# Patient Record
Sex: Male | Born: 1960 | Race: Black or African American | Hispanic: No | Marital: Single | Smoking: Current every day smoker
Health system: Southern US, Community
[De-identification: ages and names within clinical notes are randomized; demographics above are authoritative.]

## PROBLEM LIST (undated history)

## (undated) DIAGNOSIS — R45850 Homicidal ideations: Secondary | ICD-10-CM

## (undated) DIAGNOSIS — F329 Major depressive disorder, single episode, unspecified: Secondary | ICD-10-CM

## (undated) DIAGNOSIS — F1910 Other psychoactive substance abuse, uncomplicated: Secondary | ICD-10-CM

## (undated) DIAGNOSIS — M1710 Unilateral primary osteoarthritis, unspecified knee: Secondary | ICD-10-CM

## (undated) DIAGNOSIS — Z765 Malingerer [conscious simulation]: Secondary | ICD-10-CM

## (undated) DIAGNOSIS — F319 Bipolar disorder, unspecified: Secondary | ICD-10-CM

## (undated) DIAGNOSIS — F259 Schizoaffective disorder, unspecified: Secondary | ICD-10-CM

## (undated) DIAGNOSIS — T50901A Poisoning by unspecified drugs, medicaments and biological substances, accidental (unintentional), initial encounter: Secondary | ICD-10-CM

## (undated) DIAGNOSIS — I639 Cerebral infarction, unspecified: Secondary | ICD-10-CM

## (undated) DIAGNOSIS — F209 Schizophrenia, unspecified: Secondary | ICD-10-CM

## (undated) DIAGNOSIS — F1020 Alcohol dependence, uncomplicated: Secondary | ICD-10-CM

## (undated) DIAGNOSIS — F14951 Cocaine use, unspecified with cocaine-induced psychotic disorder with hallucinations: Secondary | ICD-10-CM

## (undated) DIAGNOSIS — I10 Essential (primary) hypertension: Secondary | ICD-10-CM

## (undated) DIAGNOSIS — M1990 Unspecified osteoarthritis, unspecified site: Secondary | ICD-10-CM

## (undated) DIAGNOSIS — F32A Depression, unspecified: Secondary | ICD-10-CM

## (undated) HISTORY — PX: HERNIA REPAIR: SHX51

## (undated) HISTORY — PX: NO PAST SURGERIES: SHX2092

---

## 2003-04-19 ENCOUNTER — Inpatient Hospital Stay (HOSPITAL_COMMUNITY): Admission: EM | Admit: 2003-04-19 | Discharge: 2003-04-19 | Payer: Self-pay | Admitting: Emergency Medicine

## 2003-04-19 ENCOUNTER — Encounter: Payer: Self-pay | Admitting: Emergency Medicine

## 2005-03-15 ENCOUNTER — Emergency Department (HOSPITAL_COMMUNITY): Admission: EM | Admit: 2005-03-15 | Discharge: 2005-03-15 | Payer: Self-pay | Admitting: Emergency Medicine

## 2005-03-16 ENCOUNTER — Emergency Department (HOSPITAL_COMMUNITY): Admission: EM | Admit: 2005-03-16 | Discharge: 2005-03-16 | Payer: Self-pay | Admitting: Emergency Medicine

## 2005-04-05 ENCOUNTER — Emergency Department: Payer: Self-pay | Admitting: Unknown Physician Specialty

## 2005-04-05 ENCOUNTER — Other Ambulatory Visit: Payer: Self-pay

## 2005-04-21 ENCOUNTER — Emergency Department (HOSPITAL_COMMUNITY): Admission: EM | Admit: 2005-04-21 | Discharge: 2005-04-21 | Payer: Self-pay | Admitting: Emergency Medicine

## 2006-03-31 ENCOUNTER — Emergency Department (HOSPITAL_COMMUNITY): Admission: EM | Admit: 2006-03-31 | Discharge: 2006-03-31 | Payer: Self-pay | Admitting: Vascular Surgery

## 2006-05-03 ENCOUNTER — Emergency Department: Payer: Self-pay | Admitting: General Practice

## 2006-05-10 ENCOUNTER — Emergency Department: Payer: Self-pay | Admitting: General Practice

## 2006-05-10 ENCOUNTER — Emergency Department: Payer: Self-pay | Admitting: Emergency Medicine

## 2006-05-10 ENCOUNTER — Other Ambulatory Visit: Payer: Self-pay

## 2006-07-28 ENCOUNTER — Emergency Department (HOSPITAL_COMMUNITY): Admission: EM | Admit: 2006-07-28 | Discharge: 2006-07-28 | Payer: Self-pay | Admitting: Emergency Medicine

## 2011-02-22 ENCOUNTER — Inpatient Hospital Stay (HOSPITAL_COMMUNITY)
Admission: EM | Admit: 2011-02-22 | Discharge: 2011-02-27 | DRG: 894 | Discharge status: Against Medical Advice | Payer: Self-pay | Attending: Internal Medicine | Admitting: Internal Medicine

## 2011-02-22 DIAGNOSIS — F10231 Alcohol dependence with withdrawal delirium: Principal | ICD-10-CM | POA: Diagnosis present

## 2011-02-22 DIAGNOSIS — R4182 Altered mental status, unspecified: Secondary | ICD-10-CM | POA: Diagnosis present

## 2011-02-22 DIAGNOSIS — F17200 Nicotine dependence, unspecified, uncomplicated: Secondary | ICD-10-CM | POA: Diagnosis present

## 2011-02-22 DIAGNOSIS — F1410 Cocaine abuse, uncomplicated: Secondary | ICD-10-CM | POA: Diagnosis present

## 2011-02-22 DIAGNOSIS — F10931 Alcohol use, unspecified with withdrawal delirium: Principal | ICD-10-CM | POA: Diagnosis present

## 2011-02-22 DIAGNOSIS — Z5900 Homelessness unspecified: Secondary | ICD-10-CM

## 2011-02-22 DIAGNOSIS — F29 Unspecified psychosis not due to a substance or known physiological condition: Secondary | ICD-10-CM | POA: Diagnosis present

## 2011-02-22 DIAGNOSIS — F10229 Alcohol dependence with intoxication, unspecified: Secondary | ICD-10-CM | POA: Diagnosis present

## 2011-02-22 LAB — COMPREHENSIVE METABOLIC PANEL WITH GFR
AST: 38 U/L — ABNORMAL HIGH (ref 0–37)
CO2: 22 meq/L (ref 19–32)
Calcium: 9 mg/dL (ref 8.4–10.5)
Creatinine, Ser: 1.1 mg/dL (ref 0.4–1.5)
GFR calc Af Amer: >60 mL/min (ref >60)
GFR calc non Af Amer: >60 mL/min (ref >60)
Glucose, Bld: 142 mg/dL — ABNORMAL HIGH (ref 70–99)

## 2011-02-22 LAB — URINALYSIS, ROUTINE W REFLEX MICROSCOPIC
Bilirubin Urine: NEGATIVE
Glucose, UA: NEGATIVE mg/dL
Hgb urine dipstick: NEGATIVE
Ketones, ur: NEGATIVE mg/dL
Nitrite: NEGATIVE
Protein, ur: NEGATIVE mg/dL
Specific Gravity, Urine: 1.013 (ref 1.005–1.030)
Urobilinogen, UA: 0.2 mg/dL (ref 0.0–1.0)
pH: 5.5 (ref 5.0–8.0)

## 2011-02-22 LAB — CBC
HCT: 43.9 % (ref 39.0–52.0)
Hemoglobin: 14.7 g/dL (ref 13.0–17.0)
MCH: 27.4 pg (ref 26.0–34.0)
MCHC: 33.5 g/dL (ref 30.0–36.0)
MCV: 81.9 fL (ref 78.0–100.0)
Platelets: 261 K/uL (ref 150–400)
RBC: 5.36 MIL/uL (ref 4.22–5.81)
RDW: 13.4 % (ref 11.5–15.5)
WBC: 6.5 10*3/uL (ref 4.0–10.5)

## 2011-02-22 LAB — COMPREHENSIVE METABOLIC PANEL
ALT: 42 U/L (ref 0–53)
Albumin: 4.3 g/dL (ref 3.5–5.2)
Alkaline Phosphatase: 89 U/L (ref 39–117)
BUN: 11 mg/dL (ref 6–23)
Chloride: 110 mEq/L (ref 96–112)
Potassium: 3.7 mEq/L (ref 3.5–5.1)
Sodium: 143 mEq/L (ref 135–145)
Total Bilirubin: 0.3 mg/dL (ref 0.3–1.2)
Total Protein: 7.6 g/dL (ref 6.0–8.3)

## 2011-02-22 LAB — RAPID URINE DRUG SCREEN, HOSP PERFORMED
Amphetamines: NOT DETECTED
Barbiturates: NOT DETECTED
Benzodiazepines: NOT DETECTED
Cocaine: POSITIVE — AB
Opiates: NOT DETECTED
Tetrahydrocannabinol: NOT DETECTED

## 2011-02-22 LAB — DIFFERENTIAL
Basophils Absolute: 0 K/uL (ref 0.0–0.1)
Basophils Relative: 0 % (ref 0–1)
Eosinophils Absolute: 0.3 K/uL (ref 0.0–0.7)
Eosinophils Relative: 4 % (ref 0–5)
Lymphocytes Relative: 43 % (ref 12–46)
Lymphs Abs: 2.8 10*3/uL (ref 0.7–4.0)
Monocytes Absolute: 0.7 K/uL (ref 0.1–1.0)
Monocytes Relative: 11 % (ref 3–12)
Neutro Abs: 2.7 10*3/uL (ref 1.7–7.7)
Neutrophils Relative %: 41 % — ABNORMAL LOW (ref 43–77)

## 2011-02-22 LAB — ETHANOL: Alcohol, Ethyl (B): 335 mg/dL — ABNORMAL HIGH (ref 0–10)

## 2011-02-23 LAB — ETHANOL: Alcohol, Ethyl (B): 173 mg/dL — ABNORMAL HIGH (ref 0–10)

## 2011-02-24 LAB — BASIC METABOLIC PANEL
BUN: 16 mg/dL (ref 6–23)
CO2: 25 mEq/L (ref 19–32)
Calcium: 8.9 mg/dL (ref 8.4–10.5)
Chloride: 108 mEq/L (ref 96–112)
Creatinine, Ser: 0.98 mg/dL (ref 0.4–1.5)
GFR calc Af Amer: >60 mL/min (ref >60)
Glucose, Bld: 94 mg/dL (ref 70–99)

## 2011-02-24 LAB — BASIC METABOLIC PANEL WITH GFR
GFR calc non Af Amer: >60 mL/min (ref >60)
Potassium: 4 meq/L (ref 3.5–5.1)
Sodium: 141 meq/L (ref 135–145)

## 2011-02-26 DIAGNOSIS — F4310 Post-traumatic stress disorder, unspecified: Secondary | ICD-10-CM

## 2011-02-27 ENCOUNTER — Emergency Department (HOSPITAL_COMMUNITY)
Admission: EM | Admit: 2011-02-27 | Discharge: 2011-02-28 | Disposition: A | Discharge status: Home / Self Care | Payer: Self-pay | Attending: Emergency Medicine | Admitting: Emergency Medicine

## 2011-02-27 ENCOUNTER — Emergency Department (HOSPITAL_COMMUNITY): Payer: Self-pay

## 2011-02-27 DIAGNOSIS — R443 Hallucinations, unspecified: Secondary | ICD-10-CM | POA: Insufficient documentation

## 2011-02-27 DIAGNOSIS — R45851 Suicidal ideations: Secondary | ICD-10-CM | POA: Insufficient documentation

## 2011-02-27 DIAGNOSIS — F1010 Alcohol abuse, uncomplicated: Secondary | ICD-10-CM | POA: Insufficient documentation

## 2011-02-27 DIAGNOSIS — F3289 Other specified depressive episodes: Secondary | ICD-10-CM | POA: Insufficient documentation

## 2011-02-27 DIAGNOSIS — F329 Major depressive disorder, single episode, unspecified: Secondary | ICD-10-CM | POA: Insufficient documentation

## 2011-02-27 LAB — DIFFERENTIAL
Basophils Absolute: 0 10*3/uL (ref 0.0–0.1)
Basophils Relative: 1 % (ref 0–1)
Eosinophils Absolute: 0.3 10*3/uL (ref 0.0–0.7)
Eosinophils Relative: 4 % (ref 0–5)
Monocytes Absolute: 0.4 10*3/uL (ref 0.1–1.0)
Monocytes Relative: 7 % (ref 3–12)
Neutro Abs: 3.7 10*3/uL (ref 1.7–7.7)

## 2011-02-27 LAB — ACETAMINOPHEN LEVEL: Acetaminophen (Tylenol), Serum: <10 ug/mL — ABNORMAL LOW (ref 10–30)

## 2011-02-27 LAB — CBC
Hemoglobin: 13.4 g/dL (ref 13.0–17.0)
MCH: 27.3 pg (ref 26.0–34.0)
MCHC: 33.3 g/dL (ref 30.0–36.0)
Platelets: 220 10*3/uL (ref 150–400)
RDW: 13.1 % (ref 11.5–15.5)

## 2011-02-27 LAB — COMPREHENSIVE METABOLIC PANEL
AST: 89 U/L — ABNORMAL HIGH (ref 0–37)
Albumin: 4 g/dL (ref 3.5–5.2)
BUN: 7 mg/dL (ref 6–23)
Calcium: 9.2 mg/dL (ref 8.4–10.5)
Chloride: 105 mEq/L (ref 96–112)
Creatinine, Ser: 0.77 mg/dL (ref 0.4–1.5)
GFR calc Af Amer: >60 mL/min (ref >60)
GFR calc non Af Amer: >60 mL/min (ref >60)
Total Bilirubin: 0.4 mg/dL (ref 0.3–1.2)

## 2011-02-27 LAB — SALICYLATE LEVEL: Salicylate Lvl: <4 mg/dL (ref 2.8–20.0)

## 2011-02-27 LAB — RAPID URINE DRUG SCREEN, HOSP PERFORMED: Barbiturates: NOT DETECTED

## 2011-02-27 LAB — URINALYSIS, ROUTINE W REFLEX MICROSCOPIC
Ketones, ur: NEGATIVE mg/dL
Nitrite: NEGATIVE
Protein, ur: NEGATIVE mg/dL
pH: 6 (ref 5.0–8.0)

## 2011-02-27 NOTE — Consult Note (Signed)
NAME:  [name] [name], [name] [name]                ACCOUNT NO.:  [id number]  MEDICAL RECORD NO.:  [id number]           PATIENT TYPE:  I  LOCATION:  1508                         FACILITY:  [facility]  PHYSICIAN:  Eulogio Ditch, MD DATE OF BIRTH:  12-07-1960  DATE OF CONSULTATION:  02/26/2011 DATE OF DISCHARGE:                                CONSULTATION   REASON FOR CONSULTATION:  Hallucinations/alcohol abuse.  HISTORY OF PRESENT ILLNESS:  This is a 50 year old African American male who was seen in the medical floor with clinical social worker.  I also reviewed the patient's records.  The patient has a [place] history of alcohol and cocaine abuse.  The patient was recently in [place] [facility].  He stayed there for 28 days.  After discharge after 1 week, he started drinking alcohol.  The patient told me that he lives in the street and it is difficult to stay on the street, that is why he started drinking and using cocaine.  The patient wants to go to rehab place in [place].  The patient is very logical and goal directed, not delusional.  He reported hearing voices on and off often, "common dye."  The patient denies any suicidal or homicidal ideations.  The patient is just looking for placement.  He wants to go to the rehab.  The patient is homeless.  No family support.  MEDICAL HISTORY:  History of questionable arthritis.  ALLERGIES:  No known drug allergies.  CURRENT PSYCHIATRIC MEDICATIONS: 1. Zoloft 50 mg p.o. daily. 2. Trazodone 100 mg p.o. daily. 3. Haldol 5 mg p.o. q.h.s. 4. Neurontin 600 mg t.i.d. 5. Cogentin 0.5 mg p.o. b.i.d..  MENTAL STATUS EXAMINATION:  The patient is calm and cooperative during interview.  Fair eye contact, pleasant on approach.  No abnormal movements noticed.  No agitation noted during the interview.  Mood euthymic.  Affect and mood, congruent.  Thought process, logical and goal directed.  Thought content, not suicidal or  homicidal, no delusional thought perception reported on and off hearing voices, but the patient is not internally preoccupied, able to make logical conversation.  Cognition, alert, awake, and oriented x3.  Memory, immediate, recent and remote, fair.  Attention concentration, fair. Abstraction ability, good.  Insight and judgment fair.  DIAGNOSES:  AXIS I:  Polysubstance abuse, psychosis, not otherwise specified. AXIS II:  Deferred. AXIS III:  See medical notes. AXIS IV:  Chronic alcohol abuse, homeless, poor family support. AXIS V:  40 to 50.  RECOMMENDATIONS: 1. I discussed with the patient regarding the abuse of the drugs and     compliance with his medications. 2. The patient wants to go to the inpatient rehab.  He do not want to     live on the street. 3. He wants to go to the rehab in [place].  The case manager is     going to help the patient to get him to the rehab. 4. The patient at this time, does not meet any criteria to be admitted     to Inpatient Psychiatry. 5. I will follow up on this patient as  needed.  Thank you for involving me in taking care of this patient.     Eulogio Ditch, MD     SA/MEDQ  D:  02/26/2011  T:  02/26/2011  Job:  [id number]  Electronically Signed by Eulogio Ditch  on 02/27/2011 06:50:48 PM

## 2011-02-28 DIAGNOSIS — F1920 Other psychoactive substance dependence, uncomplicated: Secondary | ICD-10-CM

## 2011-03-01 ENCOUNTER — Emergency Department (HOSPITAL_COMMUNITY)
Admission: EM | Admit: 2011-03-01 | Discharge: 2011-03-02 | Disposition: A | Discharge status: Home / Self Care | Payer: Self-pay | Attending: Emergency Medicine | Admitting: Emergency Medicine

## 2011-03-01 ENCOUNTER — Emergency Department (HOSPITAL_COMMUNITY): Payer: Self-pay

## 2011-03-01 ENCOUNTER — Emergency Department (HOSPITAL_COMMUNITY)
Admission: EM | Admit: 2011-03-01 | Discharge: 2011-03-01 | Discharge status: Against Medical Advice | Payer: Self-pay | Attending: Emergency Medicine | Admitting: Emergency Medicine

## 2011-03-01 DIAGNOSIS — R569 Unspecified convulsions: Secondary | ICD-10-CM | POA: Insufficient documentation

## 2011-03-01 DIAGNOSIS — F1010 Alcohol abuse, uncomplicated: Secondary | ICD-10-CM | POA: Insufficient documentation

## 2011-03-01 DIAGNOSIS — F319 Bipolar disorder, unspecified: Secondary | ICD-10-CM | POA: Insufficient documentation

## 2011-03-01 DIAGNOSIS — F17200 Nicotine dependence, unspecified, uncomplicated: Secondary | ICD-10-CM | POA: Insufficient documentation

## 2011-03-01 DIAGNOSIS — R4182 Altered mental status, unspecified: Secondary | ICD-10-CM | POA: Insufficient documentation

## 2011-03-01 LAB — COMPREHENSIVE METABOLIC PANEL
Albumin: 3.6 g/dL (ref 3.5–5.2)
Alkaline Phosphatase: 82 U/L (ref 39–117)
BUN: 13 mg/dL (ref 6–23)
CO2: 27 mEq/L (ref 19–32)
Chloride: 106 mEq/L (ref 96–112)
GFR calc non Af Amer: >60 mL/min (ref >60)
Glucose, Bld: 134 mg/dL — ABNORMAL HIGH (ref 70–99)
Potassium: 3.6 mEq/L (ref 3.5–5.1)
Total Bilirubin: 0.4 mg/dL (ref 0.3–1.2)

## 2011-03-01 LAB — CBC
HCT: 36.8 % — ABNORMAL LOW (ref 39.0–52.0)
Hemoglobin: 12.3 g/dL — ABNORMAL LOW (ref 13.0–17.0)
MCH: 27.4 pg (ref 26.0–34.0)
MCH: 27.9 pg (ref 26.0–34.0)
MCHC: 33.4 g/dL (ref 30.0–36.0)
MCHC: 34.1 g/dL (ref 30.0–36.0)
MCV: 82 fL (ref 78.0–100.0)
MCV: 81.8 fL (ref 78.0–100.0)
Platelets: 237 10*3/uL (ref 150–400)
RBC: 4.49 MIL/uL (ref 4.22–5.81)
RDW: 13.8 % (ref 11.5–15.5)
WBC: 6.7 10*3/uL (ref 4.0–10.5)

## 2011-03-01 LAB — RAPID URINE DRUG SCREEN, HOSP PERFORMED
Barbiturates: NOT DETECTED
Cocaine: NOT DETECTED
Opiates: NOT DETECTED
Tetrahydrocannabinol: POSITIVE — AB
Tetrahydrocannabinol: NOT DETECTED

## 2011-03-01 LAB — DIFFERENTIAL
Eosinophils Absolute: 0.1 10*3/uL (ref 0.0–0.7)
Eosinophils Relative: 1 % (ref 0–5)
Lymphs Abs: 2 10*3/uL (ref 0.7–4.0)
Lymphs Abs: 2.1 10*3/uL (ref 0.7–4.0)
Monocytes Absolute: 0.3 10*3/uL (ref 0.1–1.0)
Monocytes Absolute: 0.3 10*3/uL (ref 0.1–1.0)
Monocytes Relative: 5 % (ref 3–12)
Monocytes Relative: 4 % (ref 3–12)
Neutro Abs: 4 10*3/uL (ref 1.7–7.7)
Neutrophils Relative %: 63 % (ref 43–77)

## 2011-03-01 LAB — SALICYLATE LEVEL: Salicylate Lvl: <4 mg/dL (ref 2.8–20.0)

## 2011-03-01 LAB — URINALYSIS, ROUTINE W REFLEX MICROSCOPIC
Bilirubin Urine: NEGATIVE
Ketones, ur: NEGATIVE mg/dL
Nitrite: NEGATIVE
Protein, ur: NEGATIVE mg/dL
pH: 6 (ref 5.0–8.0)

## 2011-03-01 LAB — BASIC METABOLIC PANEL
BUN: 14 mg/dL (ref 6–23)
CO2: 25 mEq/L (ref 19–32)
Calcium: 9.5 mg/dL (ref 8.4–10.5)
Creatinine, Ser: 1 mg/dL (ref 0.4–1.5)
GFR calc non Af Amer: >60 mL/min (ref >60)
Glucose, Bld: 155 mg/dL — ABNORMAL HIGH (ref 70–99)

## 2011-03-01 LAB — ETHANOL
Alcohol, Ethyl (B): 202 mg/dL — ABNORMAL HIGH (ref 0–10)
Alcohol, Ethyl (B): 210 mg/dL — ABNORMAL HIGH (ref 0–10)

## 2011-03-02 ENCOUNTER — Emergency Department (HOSPITAL_COMMUNITY)
Admission: EM | Admit: 2011-03-02 | Discharge: 2011-03-02 | Disposition: A | Discharge status: Home / Self Care | Payer: Self-pay | Attending: Emergency Medicine | Admitting: Emergency Medicine

## 2011-03-02 DIAGNOSIS — IMO0001 Reserved for inherently not codable concepts without codable children: Secondary | ICD-10-CM | POA: Insufficient documentation

## 2011-03-02 DIAGNOSIS — R443 Hallucinations, unspecified: Secondary | ICD-10-CM | POA: Insufficient documentation

## 2011-03-02 DIAGNOSIS — F411 Generalized anxiety disorder: Secondary | ICD-10-CM | POA: Insufficient documentation

## 2011-03-02 DIAGNOSIS — G40909 Epilepsy, unspecified, not intractable, without status epilepticus: Secondary | ICD-10-CM | POA: Insufficient documentation

## 2011-03-02 DIAGNOSIS — R45851 Suicidal ideations: Secondary | ICD-10-CM | POA: Insufficient documentation

## 2011-03-02 DIAGNOSIS — F1910 Other psychoactive substance abuse, uncomplicated: Secondary | ICD-10-CM | POA: Insufficient documentation

## 2011-03-02 DIAGNOSIS — F3130 Bipolar disorder, current episode depressed, mild or moderate severity, unspecified: Secondary | ICD-10-CM | POA: Insufficient documentation

## 2011-03-02 LAB — COMPREHENSIVE METABOLIC PANEL
ALT: 156 U/L — ABNORMAL HIGH (ref 0–53)
Albumin: 4.1 g/dL (ref 3.5–5.2)
Alkaline Phosphatase: 96 U/L (ref 39–117)
BUN: 15 mg/dL (ref 6–23)
Calcium: 9.4 mg/dL (ref 8.4–10.5)
Potassium: 4.2 mEq/L (ref 3.5–5.1)
Sodium: 140 mEq/L (ref 135–145)
Total Protein: 7.2 g/dL (ref 6.0–8.3)

## 2011-03-02 LAB — RAPID URINE DRUG SCREEN, HOSP PERFORMED
Cocaine: NOT DETECTED
Tetrahydrocannabinol: POSITIVE — AB

## 2011-03-02 LAB — URINE CULTURE
Colony Count: NO GROWTH
Culture: NO GROWTH

## 2011-03-02 LAB — ETHANOL: Alcohol, Ethyl (B): 92 mg/dL — ABNORMAL HIGH (ref 0–10)

## 2011-03-03 ENCOUNTER — Emergency Department (HOSPITAL_COMMUNITY): Payer: Self-pay

## 2011-03-03 ENCOUNTER — Emergency Department (HOSPITAL_COMMUNITY)
Admission: EM | Admit: 2011-03-03 | Discharge: 2011-03-03 | Disposition: A | Discharge status: Home / Self Care | Payer: Self-pay | Attending: Emergency Medicine | Admitting: Emergency Medicine

## 2011-03-03 DIAGNOSIS — R1110 Vomiting, unspecified: Secondary | ICD-10-CM | POA: Insufficient documentation

## 2011-03-03 DIAGNOSIS — R059 Cough, unspecified: Secondary | ICD-10-CM | POA: Insufficient documentation

## 2011-03-03 DIAGNOSIS — R05 Cough: Secondary | ICD-10-CM | POA: Insufficient documentation

## 2011-03-03 DIAGNOSIS — F3130 Bipolar disorder, current episode depressed, mild or moderate severity, unspecified: Secondary | ICD-10-CM | POA: Insufficient documentation

## 2011-03-03 DIAGNOSIS — R10816 Epigastric abdominal tenderness: Secondary | ICD-10-CM | POA: Insufficient documentation

## 2011-03-03 DIAGNOSIS — R748 Abnormal levels of other serum enzymes: Secondary | ICD-10-CM | POA: Insufficient documentation

## 2011-03-03 DIAGNOSIS — R109 Unspecified abdominal pain: Secondary | ICD-10-CM | POA: Insufficient documentation

## 2011-03-03 DIAGNOSIS — G40909 Epilepsy, unspecified, not intractable, without status epilepticus: Secondary | ICD-10-CM | POA: Insufficient documentation

## 2011-03-03 DIAGNOSIS — N289 Disorder of kidney and ureter, unspecified: Secondary | ICD-10-CM | POA: Insufficient documentation

## 2011-03-03 DIAGNOSIS — R319 Hematuria, unspecified: Secondary | ICD-10-CM | POA: Insufficient documentation

## 2011-03-03 DIAGNOSIS — F17200 Nicotine dependence, unspecified, uncomplicated: Secondary | ICD-10-CM | POA: Insufficient documentation

## 2011-03-03 DIAGNOSIS — K8020 Calculus of gallbladder without cholecystitis without obstruction: Secondary | ICD-10-CM | POA: Insufficient documentation

## 2011-03-03 LAB — DIFFERENTIAL
Eosinophils Relative: 3 % (ref 0–5)
Lymphocytes Relative: 46 % (ref 12–46)
Lymphs Abs: 3 10*3/uL (ref 0.7–4.0)
Monocytes Relative: 8 % (ref 3–12)

## 2011-03-03 LAB — CBC
HCT: 43.1 % (ref 39.0–52.0)
MCHC: 34.3 g/dL (ref 30.0–36.0)
Platelets: 257 10*3/uL (ref 150–400)
RBC: 5.32 MIL/uL (ref 4.22–5.81)
WBC: 6.5 10*3/uL (ref 4.0–10.5)

## 2011-03-03 LAB — POCT I-STAT, CHEM 8
BUN: 23 mg/dL (ref 6–23)
Chloride: 106 mEq/L (ref 96–112)
Creatinine, Ser: 1.4 mg/dL (ref 0.4–1.5)
HCT: 46 % (ref 39.0–52.0)
HCT: 50 % (ref 39.0–52.0)
Hemoglobin: 17 g/dL (ref 13.0–17.0)
Potassium: 3.7 mEq/L (ref 3.5–5.1)
Potassium: 4.7 mEq/L (ref 3.5–5.1)
Sodium: 137 mEq/L (ref 135–145)
Sodium: 137 mEq/L (ref 135–145)

## 2011-03-03 LAB — COMPREHENSIVE METABOLIC PANEL
ALT: 157 U/L — ABNORMAL HIGH (ref 0–53)
AST: 72 U/L — ABNORMAL HIGH (ref 0–37)
Albumin: 4.2 g/dL (ref 3.5–5.2)
Alkaline Phosphatase: 98 U/L (ref 39–117)
Calcium: 9 mg/dL (ref 8.4–10.5)
GFR calc Af Amer: >60 mL/min (ref >60)
Glucose, Bld: 93 mg/dL (ref 70–99)
Potassium: 3.8 mEq/L (ref 3.5–5.1)
Sodium: 137 mEq/L (ref 135–145)
Total Protein: 7.3 g/dL (ref 6.0–8.3)

## 2011-03-03 LAB — URINALYSIS, ROUTINE W REFLEX MICROSCOPIC
Glucose, UA: NEGATIVE mg/dL
Ketones, ur: NEGATIVE mg/dL
Nitrite: NEGATIVE
Specific Gravity, Urine: 1.006 (ref 1.005–1.030)
pH: 6 (ref 5.0–8.0)

## 2011-03-03 LAB — LIPASE, BLOOD: Lipase: 20 U/L (ref 11–59)

## 2011-03-03 LAB — CK: Total CK: 217 U/L (ref 7–232)

## 2011-03-03 LAB — OCCULT BLOOD, POC DEVICE: Fecal Occult Bld: NEGATIVE

## 2011-03-03 NOTE — Consult Note (Signed)
  NAME:  [name] [name], [name] [name]                ACCOUNT NO.:  [id number]  MEDICAL RECORD NO.:  [id number]           PATIENT TYPE:  E  LOCATION:  WLED                         FACILITY:  [facility]  PHYSICIAN:  Eulogio Ditch, MD DATE OF BIRTH:  [birth year]  DATE OF CONSULTATION:  02/28/2011 DATE OF DISCHARGE:                                CONSULTATION   REASON FOR CONSULTATION:  Alcohol abuse.  HISTORY OF PRESENT ILLNESS:  I saw the patient and reviewed the comprehensive assessment and ED records.  This is a 50 year old African American male who was seen by me 2 days ago on the [facility] floor. The patient wanted to go to the rehab in [place], but later on next day, he signed AMA.  The patient came to the [facility] ED to get help for alcohol detox.  Also, he was using heroin and crack cocaine.  The patient became earlier agitated in the ER, but currently when I saw the patient with the Clydie Braun, the patient is very logical and goal directed during the interview.  Denies any suicidal or homicidal ideations. Denies hearing voices.  He is very logical and goal directed.  The patient wants to go to the shelter and he said I want to be discharged, otherwise I will lose bed at the shelter.  MENTAL STATUS EXAMINATION:  Calm and cooperative during interview.  Fair eye contact.  No abnormal movements noticed.  Speech, normal in rate, rhythm, and volume.  Mood euthymic.  Affect, mood congruent.  Thought process, logical and goal directed.  Thought content, not suicidal or homicidal, not delusional, not internally preoccupied.  Denies hearing any voices.  Cognition, alert, awake, and oriented x3.  Memory, immediate, recent, and remote, fair.  Attention and concentration good. Abstraction ability good.  Insight and judgment intact.  DIAGNOSES:  AXIS I:  Polysubstance abuse. AXIS II:  Deferred. AXIS III:  No active medical issue. AXIS IV:  Chronic substance dependence. AXIS V:   50.  RECOMMENDATIONS: 1. The patient does not want to be admitted for detox.  He wanted to     be discharged at this time.  The patient does not meet any criteria     to be admitted on IVC. 2. The patient can be discharged at this time, to be followed in the     outpatient setting.     Eulogio Ditch, MD     SA/MEDQ  D:  02/28/2011  T:  02/28/2011  Job:  [id number]  Electronically Signed by Eulogio Ditch  on 03/03/2011 05:39:58 AM

## 2011-03-05 LAB — GLUCOSE, CAPILLARY: Glucose-Capillary: 126 mg/dL — ABNORMAL HIGH (ref 70–99)

## 2011-03-06 NOTE — Discharge Summary (Signed)
  NAME:  [name] [name], [name] [name]                ACCOUNT NO.:  [id number]  MEDICAL RECORD NO.:  [id number]           PATIENT TYPE:  I  LOCATION:  1508                         FACILITY:  [facility]  PHYSICIAN:  [name] I Carlean Crowl, MD      DATE OF BIRTH:  [birth year]  DATE OF ADMISSION:  02/22/2011 DATE OF DISCHARGE:  02/27/2011                              DISCHARGE SUMMARY   The patient signed AMA.  DISCHARGE DIAGNOSES: 1. Alcohol abuse and delirium tremens. 2. Cocaine abuse. 3. History of psychosis. 4. Ongoing tobacco abuse. 5. Homeless state.  MEDICATIONS:  The patient signed against medical advice.  CONSULTATION:  Eulogio Ditch, MD, Psychiatry did see the patient.  HISTORY OF PRESENT ILLNESS:  This is a 50 year old homeless with alcohol abuse, depression who presented with a chief complaint of altered mental status.  He admitted he was hearing voices and verbalized suicidal ideation.  He was brought by the EMS, found to have alcohol level of 335.  Drug screen was positive for cocaine.  He became more coherent and he determined that he has a history of seizure with alcohol withdrawal. The patient admitted for alcohol withdrawal.  Started on seizure precautions and continued Librium.  The patient was provided with thiamine and folic acid.  The patient continued to request signing against medical advice.  Psychiatry consulted for his competency and his hallucination there, Dr. Rogers Blocker.  The patient did not require any inpatient psych admission.  The patient understands the risk of leaving the hospital and the risk of seizure.  I called the patient myself and patient would like to be discharged.  The patient admitted there is nothing being done since admission.  The patient was seen by the Psychiatry and social worker.  The patient informed about the risk of leaving the hospital against medical advice.  I do not feel the patient is confused.  He is totally awake, alert, and oriented at this  time.  No prescription was prescribed.     [name] Cheese Bosie Helper, MD     HIE/MEDQ  D:  02/27/2011  T:  02/28/2011  Job:  [id number]  Electronically Signed by Ebony Cargo MD on 03/06/2011 01:50:47 PM

## 2011-03-08 NOTE — H&P (Signed)
NAME:  [name] [name], [name] [name]                ACCOUNT NO.:  [id number]  MEDICAL RECORD NO.:  [id number]           PATIENT TYPE:  E  LOCATION:  WLED                         FACILITY:  [facility]  PHYSICIAN:  Gwenyth Bender, NP      DATE OF BIRTH:  [birth year]  DATE OF ADMISSION:  02/22/2011 DATE OF DISCHARGE:                             HISTORY & PHYSICAL   PRIMARY CARE PROVIDER:  Unassigned.  The patient is being admitted to [facility].  CHIEF COMPLAINT:  EtOH withdrawal.  HISTORY OF PRESENT ILLNESS:  Mr. Koudelka is a 50 year old male with a history of EtOH abuse, depression, who presents to the [facility] ED via EMS with a chief complaint of altered mental status.  Information is obtained from the record, as the patient has little memory of events. Record indicates that the patient was brought into the emergency room 17 hours ago by EMS, because he was found hallucinating at the [employer].  He indicated that he was hearing voices and verbalized suicide ideation.  He was brought to the [facility] ED.  Workup at that time yielded an EtOH level of 335.  That level has come down to 173 since then.  He also had a urine drug screen positive for cocaine.  The patient was initially placed in Psych ED for a psych admission.  As he became more coherent and was able to provide a medical history, it was determined that he has a history of seizures with EtOH withdrawal.  It was felt that a medical admission would be more prudent given this history.  We are asked to admit for further evaluation and treatment. Symptoms came on gradually, are improving, are considered mild to moderate.  ALLERGIES:  No known drug allergies.  PAST MEDICAL HISTORY: 1. Depression. 2. Questionable psychosis. 3. Questionable arthritis/knee pain.  PAST SURGICAL HISTORY:  None.  FAMILY MEDICAL HISTORY:  Mother deceased when he was a young child from an MI.  Father deceased at age 66 from an MI.   He has 3 siblings and is unaware of any health problems that they have.  SOCIAL HISTORY:  He is unemployed.  He drinks a fifth of gin a day and has done so since he was 50 years old.  He smokes half a pack of cigarettes a day and has done so for decades.  He denies any other drug use.  MEDICATIONS: 1. Miconazole 2% powder topical b.i.d. 2. Zoloft 50 mg p.o. daily. 3. Trazodone 100 mg p.o. daily. 4. Thiamine 100 mg p.o. daily. 5. Lorazepam 1 mg p.o. 1 tablet every 6 hours as needed for anxiety. 6. Cortisone cream topical t.i.d. as needed for rash. 7. Haldol 5 mg p.o. 1 tablet daily. 8. Gabapentin 600 mg t.i.d. for pain. 9. Folic acid 1 mg p.o. daily. 10.Cogentin 0.5 mg p.o. b.i.d.  REVIEW OF SYSTEMS:  GENERAL:  Denies fever, chills, anorexia, unintentional weight loss.  ENT:  Denies ear pain, nasal congestion, sore throat.  CV:  Denies chest pain, palpitation, lower extremity edema.  RESPIRATORY:  No increased workup breathing.  Denies cough. MUSCULOSKELETAL:  Positive for some chronic right knee pain, otherwise denies joint pain, muscle weakness.  NEURO:  Positive for occasional headache.  Negative for visual disturbances, numbness, tingling of extremities.  GI:  Negative abdominal pain, nausea, vomiting, diarrhea, constipation, melena.  GU:  Negative for dysuria, hematuria, frequency or urgency.  PSYCH:  Positive for depression, anxiety, hearing voices. HEME:  Negative for any unusual bruising or bleeding.  LABORATORY DATA:  Sodium 143, potassium 3.7, chloride 110, CO2 of 22, BUN 11, creatinine 1.10, glucose 142, WBC 6.5, hemoglobin 14.7, hematocrit 42.9, platelets 261,000, EtOH 173, AFT 38.  PHYSICAL EXAMINATION:  VITAL SIGNS:  Blood pressure 112/79, heart rate 117, respiratory rate 16, sats 98% on room air. GENERAL:  Awake, alert, well-nourished, well-hydrated, no acute distress. HEENT:  Head is normocephalic, atraumatic.  Pupils equal, round, reactive to light. EOMI.   Mucous membranes of his mouth are slightly dry but pink.  No obvious lesion or exudate in his nose or ears. NECK:  Supple.  No JVD.  Full range of motion.  No lymphadenopathy. CV:  Tachycardic but regular.  No murmur, gallop or rub.  No lower extremity edema.  Pedal pulses present and palpable. RESPIRATORY:  No increased workup breathing.  Breath sounds clear to auscultation bilaterally.  No rhonchi, wheezes or rales. ABDOMEN:  Round, soft, positive bowel sounds throughout, nontender to palpation.  No mass or organomegaly noted. NEUROLOGIC:  Alert and oriented x 3.  Speech clear.  Facial symmetry. Cranial nerves II through XII grossly intact. MUSCULOSKELETAL:  Moves all extremities.  Full range of motion.  No joint swelling/erythema. EXTREMITIES:  Without clubbing or cyanosis.  ASSESSMENT AND PLAN: 1. Ethanol withdrawal with history of seizures.  Will admit to tele.     Will place on seizure precautions.  Continue the Librium protocol     with CIWA assessment every 6 hours.  Also provide thiamine, folic     acid, MDI. 2. History of psychosis.  Continue his trazodone per Behavioral     Health. 3. History of questionable arthritis/right knee pain, currently at     baseline.  Will continue his Neurontin. 4. Tobacco abuse.  Will provide a nicotine patch and tobacco cessation     counseling. 5. Deep venous thrombosis prophylaxis.  Will use Lovenox. 6. Code status.  The patient is a full code.  This assessment and plan was discussed with Dr. Phineas Douglas.     Gwenyth Bender, NP     KMB/MEDQ  D:  02/23/2011  T:  02/23/2011  Job:  [id number]  Electronically Signed by Toya Smothers  on 03/08/2011 03:08:43 PM

## 2011-03-09 ENCOUNTER — Emergency Department (HOSPITAL_COMMUNITY)
Admission: EM | Admit: 2011-03-09 | Discharge: 2011-03-10 | Disposition: A | Discharge status: Home / Self Care | Payer: Self-pay | Attending: Emergency Medicine | Admitting: Emergency Medicine

## 2011-03-09 DIAGNOSIS — F411 Generalized anxiety disorder: Secondary | ICD-10-CM | POA: Insufficient documentation

## 2011-03-09 DIAGNOSIS — F1010 Alcohol abuse, uncomplicated: Secondary | ICD-10-CM | POA: Insufficient documentation

## 2011-03-09 LAB — DIFFERENTIAL
Basophils Absolute: 0 10*3/uL (ref 0.0–0.1)
Basophils Relative: 0 % (ref 0–1)
Lymphocytes Relative: 38 % (ref 12–46)
Monocytes Absolute: 0.2 10*3/uL (ref 0.1–1.0)
Monocytes Relative: 4 % (ref 3–12)
Neutro Abs: 3.3 10*3/uL (ref 1.7–7.7)
Neutrophils Relative %: 56 % (ref 43–77)

## 2011-03-09 LAB — COMPREHENSIVE METABOLIC PANEL
AST: 22 U/L (ref 0–37)
Albumin: 4.3 g/dL (ref 3.5–5.2)
Alkaline Phosphatase: 89 U/L (ref 39–117)
CO2: 21 mEq/L (ref 19–32)
Chloride: 109 mEq/L (ref 96–112)
GFR calc Af Amer: >60 mL/min (ref >60)
GFR calc non Af Amer: >60 mL/min (ref >60)
Potassium: 3.6 mEq/L (ref 3.5–5.1)
Total Bilirubin: 0.4 mg/dL (ref 0.3–1.2)

## 2011-03-09 LAB — RAPID URINE DRUG SCREEN, HOSP PERFORMED
Barbiturates: NOT DETECTED
Benzodiazepines: NOT DETECTED

## 2011-03-09 LAB — CBC
HCT: 41.5 % (ref 39.0–52.0)
Hemoglobin: 14 g/dL (ref 13.0–17.0)
MCH: 27.7 pg (ref 26.0–34.0)
MCHC: 33.7 g/dL (ref 30.0–36.0)
RBC: 5.06 MIL/uL (ref 4.22–5.81)

## 2011-03-09 LAB — ETHANOL: Alcohol, Ethyl (B): 245 mg/dL — ABNORMAL HIGH (ref 0–10)

## 2011-03-13 NOTE — Group Therapy Note (Signed)
  NAME:  [name] [name]                ACCOUNT NO.:  [id number]  MEDICAL RECORD NO.:  [id number]           PATIENT TYPE:  I  LOCATION:  1508                         FACILITY:  [facility]  PHYSICIAN:  Homero Fellers, MD   DATE OF BIRTH:  Apr 13, 1961                                PROGRESS NOTE   SUBJECTIVE: The patient had some hallucinations last night but none this morning. He appeared calm and alert.  No tremors.  The patient is homeless and is willing to consider an alcohol treatment at this time.  OBJECTIVE: VITAL SIGNS:  Blood pressure is 134/97, pulse 103, respirations 18, temperature is 98, and O2 sat is 92% on room air. GENERAL:  The patient is comfortable in no distress. HEENT:  Pallor.  Extraocular muscles are intact. LUNGS:  Clear to auscultation bilaterally. HEART:  S1-S2, no murmurs. ABDOMEN:  Full, soft, and nontender.  Bowel sounds present.  No masses. EXTREMITIES:  No edema, clubbing, or cyanosis. NEUROLOGICAL:  No deficits.  LABORATORY DATA: Sodium is 141, potassium 4, BUN 16, and creatinine 0.98.  ASSESSMENT: 1. Alcohol abuse with mild withdrawal, currently on CIWA protocol.     The patient is doing well from medical standpoint. 2. History of psychosis. 3. Tobacco abuse. 4. Cocaine abuse. 5. Homeless state.  PLAN: 1. Continue CIWA protocol. 2. Social worker to assist with inpatient alcohol treatment once     medically stable. 3. Keep on DVT prophylaxis and nicotine patch.     Homero Fellers, MD     FA/MEDQ  D:  02/24/2011  T:  02/24/2011  Job:  [id number]  Electronically Signed by Homero Fellers  on 03/13/2011 02:38:51 AM

## 2011-03-13 NOTE — Group Therapy Note (Signed)
  NAME:  [name] [name], [name] [name]                ACCOUNT NO.:  [id number]  MEDICAL RECORD NO.:  [id number]           PATIENT TYPE:  I  LOCATION:  1508                         FACILITY:  [facility]  PHYSICIAN:  Homero Fellers, MD   DATE OF BIRTH:  07/29/1961                                PROGRESS NOTE   POSSIBLE DISCHARGE DATE: February 25, 2011, or February 26, 2011, depending on bed availability.  DISPOSITION: Inpatient Alcohol Rehab.  DIAGNOSES: 1. Alcohol withdrawal. 2. Hallucinations likely secondary to his known history of psychosis,     not otherwise specified. 3. Tobacco abuse. 4. Cocaine abuse. 5. Ongoing alcohol use. 6. Homeless state.  MEDICATIONS: To be determined at discharge.  The patient is currently on CIWA protocol and this should be tapered off at the time of discharge.  CONSULTATIONS: None.  PROCEDURES DONE: None.  HOSPITAL COURSE: This is a 50 year old gentleman who has a longstanding history of alcohol abuse and depression coming to the [facility] slightly confused and was found to have an alcohol level of 335.  He was also hallucinating by hearing voices and also having some suicidal ideations.  The patient was seen by psychiatric team in the emergency room who recommended medicine admission because of his prior history of alcohol-related seizures.  The patient was placed on CIWA protocol and has been doing relatively well since admission.  He has episodes of hallucinations from time to time but no aggression, suicidal ideation, or any unruly behavior.  He has no chest pain or shortness of breath. His alcohol level has come down and the patient is currently sober.  The patient is homeless and would like to go to an inpatient alcohol treatment program.  He is cooperative and understands the risk with ongoing alcohol abuse.  PHYSICAL EXAMINATION: VITAL SIGNS:  Today showed blood pressure of 131/85, pulse 92, respiration 18, and temperature is  98.1. GENERAL:  The patient is comfortable in no distress. HEENT:  Pallor.  Extraocular muscles are intact. NECK:  Supple.  No JVD, adenopathy, or thyromegaly. LUNGS:  Clear bilaterally to auscultation. HEART:  S1-S2.  No murmurs. ABDOMEN:  Full, soft, and nontender. EXTREMITIES:  Mild tremors.  LABORATORY DATA: Sodium is 141, potassium 4, BUN 16, and creatinine 0.98.  IMAGING STUDIES: Unavailable.  DISPOSITION: To inpatient alcohol treatment once bed is available.  Case worker has been notified.  Diet is low salt.  Activity as tolerated.  Condition stable.     Homero Fellers, MD     FA/MEDQ  D:  02/25/2011  T:  02/25/2011  Job:  [id number]  Electronically Signed by Homero Fellers  on 03/13/2011 02:38:55 AM

## 2011-03-21 ENCOUNTER — Emergency Department (HOSPITAL_COMMUNITY)
Admission: EM | Admit: 2011-03-21 | Discharge: 2011-03-22 | Disposition: A | Discharge status: Home / Self Care | Payer: Self-pay | Attending: Emergency Medicine | Admitting: Emergency Medicine

## 2011-03-21 DIAGNOSIS — F3289 Other specified depressive episodes: Secondary | ICD-10-CM | POA: Insufficient documentation

## 2011-03-21 DIAGNOSIS — F329 Major depressive disorder, single episode, unspecified: Secondary | ICD-10-CM | POA: Insufficient documentation

## 2011-03-21 LAB — COMPREHENSIVE METABOLIC PANEL
AST: 18 U/L (ref 0–37)
Albumin: 4.6 g/dL (ref 3.5–5.2)
CO2: 27 mEq/L (ref 19–32)
Calcium: 10 mg/dL (ref 8.4–10.5)
Creatinine, Ser: 0.82 mg/dL (ref 0.4–1.5)
GFR calc Af Amer: >60 mL/min (ref >60)
GFR calc non Af Amer: >60 mL/min (ref >60)
Total Protein: 8 g/dL (ref 6.0–8.3)

## 2011-03-21 LAB — RAPID URINE DRUG SCREEN, HOSP PERFORMED
Benzodiazepines: NOT DETECTED
Cocaine: NOT DETECTED
Opiates: NOT DETECTED
Tetrahydrocannabinol: NOT DETECTED

## 2011-03-21 LAB — CBC
MCH: 27.6 pg (ref 26.0–34.0)
MCHC: 33.5 g/dL (ref 30.0–36.0)
Platelets: 281 10*3/uL (ref 150–400)
RDW: 12.9 % (ref 11.5–15.5)

## 2011-03-21 LAB — DIFFERENTIAL
Basophils Absolute: 0 10*3/uL (ref 0.0–0.1)
Basophils Relative: 0 % (ref 0–1)
Eosinophils Absolute: 0.2 10*3/uL (ref 0.0–0.7)
Eosinophils Relative: 4 % (ref 0–5)
Monocytes Absolute: 0.3 10*3/uL (ref 0.1–1.0)
Monocytes Relative: 6 % (ref 3–12)

## 2014-09-08 ENCOUNTER — Emergency Department (HOSPITAL_COMMUNITY)
Admission: EM | Admit: 2014-09-08 | Discharge: 2014-09-08 | Disposition: A | Discharge status: Home / Self Care | Payer: Medicare Other | Attending: Emergency Medicine | Admitting: Emergency Medicine

## 2014-09-08 ENCOUNTER — Encounter (HOSPITAL_COMMUNITY): Payer: Self-pay | Admitting: Emergency Medicine

## 2014-09-08 DIAGNOSIS — I10 Essential (primary) hypertension: Secondary | ICD-10-CM | POA: Insufficient documentation

## 2014-09-08 DIAGNOSIS — F319 Bipolar disorder, unspecified: Secondary | ICD-10-CM | POA: Insufficient documentation

## 2014-09-08 DIAGNOSIS — Z720 Tobacco use: Secondary | ICD-10-CM | POA: Insufficient documentation

## 2014-09-08 DIAGNOSIS — R1033 Periumbilical pain: Secondary | ICD-10-CM | POA: Diagnosis present

## 2014-09-08 DIAGNOSIS — K429 Umbilical hernia without obstruction or gangrene: Secondary | ICD-10-CM

## 2014-09-08 DIAGNOSIS — Z79899 Other long term (current) drug therapy: Secondary | ICD-10-CM | POA: Insufficient documentation

## 2014-09-08 HISTORY — DX: Bipolar disorder, unspecified: F31.9

## 2014-09-08 HISTORY — DX: Essential (primary) hypertension: I10

## 2014-09-08 HISTORY — DX: Major depressive disorder, single episode, unspecified: F32.9

## 2014-09-08 HISTORY — DX: Depression, unspecified: F32.A

## 2014-09-08 HISTORY — DX: Schizophrenia, unspecified: F20.9

## 2014-09-08 MED ORDER — HYDROCODONE-ACETAMINOPHEN 5-325 MG PO TABS
1.0000 | ORAL_TABLET | Freq: Once | ORAL | Status: AC
  Administered 2014-09-08: 1 via ORAL
  Filled 2014-09-08: qty 1

## 2014-09-08 MED ORDER — HYDROCODONE-ACETAMINOPHEN 5-325 MG PO TABS: 1.0000 | ORAL_TABLET | ORAL | Status: DC | PRN

## 2014-09-08 NOTE — ED Notes (Signed)
Per pt sts abdominal pain and possible hernia. sts knot in stomach that keeps moving around. sts very painful.

## 2014-09-08 NOTE — Discharge Instructions (Signed)

## 2014-09-08 NOTE — ED Provider Notes (Signed)
CSN: [id number]     Arrival date & time 09/08/14  1235 History   First MD Initiated Contact with Patient 09/08/14 1406     Chief Complaint  Patient presents with  . Hernia      HPI  Patient presents for evaluation of discomfort in his abdomen. States he feels a "knot" in his abdomen most commonly this is umbilical. He did some lifting and helping some friends move some furniture a few weeks ago. Since that time he said some pain from his umbilicus rating to the right midabdomen. States it "feels like there is a knot in there". He states when he feels on the abdomen he does not feel any bulging or see any bulging states that it just feels that way.  No nausea or vomiting. Normal bowel habits. No food intolerances. No previous abdominal surgeries.  Past Medical History  Diagnosis Date  . Hypertension   . Depression   . Bipolar 1 disorder   . Schizophrenia    History reviewed. No pertinent past surgical history. History reviewed. No pertinent family history. History  Substance Use Topics  . Smoking status: Current Every Day Smoker  . Smokeless tobacco: Not on file  . Alcohol Use: No    Review of Systems  Constitutional: Negative for fever, chills, diaphoresis, appetite change and fatigue.  HENT: Negative for mouth sores, sore throat and trouble swallowing.   Eyes: Negative for visual disturbance.  Respiratory: Negative for cough, chest tightness, shortness of breath and wheezing.   Cardiovascular: Negative for chest pain.  Gastrointestinal: Negative for nausea, vomiting, abdominal pain, diarrhea and abdominal distention.       Abdominal wall pain  Endocrine: Negative for polydipsia, polyphagia and polyuria.  Genitourinary: Negative for dysuria, frequency and hematuria.  Musculoskeletal: Negative for gait problem.  Skin: Negative for color change, pallor and rash.  Neurological: Negative for dizziness, syncope, light-headedness and headaches.  Hematological: Does not  bruise/bleed easily.  Psychiatric/Behavioral: Negative for behavioral problems and confusion.      Allergies  Review of patient's allergies indicates no known allergies.  Home Medications   Prior to Admission medications   Medication Sig Start Date End Date Taking? Authorizing Provider  divalproex (DEPAKOTE ER) 250 MG 24 hr tablet Take 250 mg by mouth daily.   Yes Historical Provider, MD  gabapentin (NEURONTIN) 600 MG tablet Take 600 mg by mouth daily.   Yes Historical Provider, MD  haloperidol (HALDOL) 5 MG tablet Take 5 mg by mouth 2 (two) times daily.   Yes Historical Provider, MD  hydrochlorothiazide (HYDRODIURIL) 25 MG tablet Take 25 mg by mouth daily.   Yes Historical Provider, MD  HYDROcodone-acetaminophen (NORCO/VICODIN) 5-325 MG per tablet Take 1 tablet by mouth every 4 (four) hours as needed. 09/08/14   Rolland PorterMark Consetta Cosner, MD   BP 149/104  Pulse 85  Temp(Src) 97.5 F (36.4 C) (Oral)  Resp 15  Ht 5\' 6"  (1.676 m)  Wt 127 lb (57.607 kg)  BMI 20.51 kg/m2  SpO2 100% Physical Exam  Constitutional: He is oriented to person, place, and time. He appears well-developed and well-nourished. No distress.  HENT:  Head: Normocephalic.  Eyes: Conjunctivae are normal. Pupils are equal, round, and reactive to light. No scleral icterus.  Neck: Normal range of motion. Neck supple. No thyromegaly present.  Cardiovascular: Normal rate and regular rhythm.  Exam reveals no gallop and no friction rub.   No murmur heard. Pulmonary/Chest: Effort normal and breath sounds normal. No respiratory distress. He has no wheezes.  He has no rales.  Abdominal: Soft. Bowel sounds are normal. He exhibits no distension. There is no tenderness. There is no rebound.    Musculoskeletal: Normal range of motion.  Neurological: He is alert and oriented to person, place, and time.  Skin: Skin is warm and dry. No rash noted.  Psychiatric: He has a normal mood and affect. His behavior is normal.    ED Course    Procedures (including critical care time) Labs Review Labs Reviewed - No data to display  Imaging Review No results found.   EKG Interpretation None      MDM   Final diagnoses:  Umbilical hernia without obstruction and without gangrene    On exam patient has easily reducible and recurrent periumbilical hernia. With manipulation hernia is him discomfort and reproduces symptoms to the right midabdomen. Otherwise abdominal exam is normal. Cautioned him against heavy lifting and activity. Surgical referral regarding discussion of repair.    Rolland PorterMark Laikynn Pollio, MD 09/08/14 [phone]

## 2014-09-08 NOTE — ED Notes (Signed)
Dr. James at bedside  

## 2014-09-16 ENCOUNTER — Emergency Department (HOSPITAL_COMMUNITY)
Admission: EM | Admit: 2014-09-16 | Discharge: 2014-09-16 | Disposition: A | Discharge status: Home / Self Care | Payer: Medicare Other | Attending: Emergency Medicine | Admitting: Emergency Medicine

## 2014-09-16 ENCOUNTER — Emergency Department (HOSPITAL_COMMUNITY): Payer: Medicare Other

## 2014-09-16 ENCOUNTER — Encounter (HOSPITAL_COMMUNITY): Payer: Self-pay | Admitting: Nurse Practitioner

## 2014-09-16 ENCOUNTER — Encounter (HOSPITAL_COMMUNITY): Payer: Self-pay | Admitting: Emergency Medicine

## 2014-09-16 DIAGNOSIS — Z79899 Other long term (current) drug therapy: Secondary | ICD-10-CM

## 2014-09-16 DIAGNOSIS — K8020 Calculus of gallbladder without cholecystitis without obstruction: Secondary | ICD-10-CM

## 2014-09-16 DIAGNOSIS — F1210 Cannabis abuse, uncomplicated: Secondary | ICD-10-CM | POA: Diagnosis not present

## 2014-09-16 DIAGNOSIS — R109 Unspecified abdominal pain: Secondary | ICD-10-CM

## 2014-09-16 DIAGNOSIS — Z720 Tobacco use: Secondary | ICD-10-CM | POA: Insufficient documentation

## 2014-09-16 DIAGNOSIS — F1410 Cocaine abuse, uncomplicated: Secondary | ICD-10-CM | POA: Diagnosis not present

## 2014-09-16 DIAGNOSIS — I10 Essential (primary) hypertension: Secondary | ICD-10-CM

## 2014-09-16 DIAGNOSIS — R1901 Right upper quadrant abdominal swelling, mass and lump: Secondary | ICD-10-CM

## 2014-09-16 DIAGNOSIS — R1011 Right upper quadrant pain: Secondary | ICD-10-CM | POA: Diagnosis not present

## 2014-09-16 DIAGNOSIS — F319 Bipolar disorder, unspecified: Secondary | ICD-10-CM

## 2014-09-16 DIAGNOSIS — K8050 Calculus of bile duct without cholangitis or cholecystitis without obstruction: Secondary | ICD-10-CM | POA: Diagnosis not present

## 2014-09-16 DIAGNOSIS — Z791 Long term (current) use of non-steroidal anti-inflammatories (NSAID): Secondary | ICD-10-CM | POA: Insufficient documentation

## 2014-09-16 LAB — CBC WITH DIFFERENTIAL/PLATELET
BASOS PCT: 1 % (ref 0–1)
Basophils Absolute: 0 10*3/uL (ref 0.0–0.1)
EOS PCT: 3 % (ref 0–5)
Eosinophils Absolute: 0.1 10*3/uL (ref 0.0–0.7)
HCT: 42.5 % (ref 39.0–52.0)
HEMOGLOBIN: 14 g/dL (ref 13.0–17.0)
Lymphocytes Relative: 68 % — ABNORMAL HIGH (ref 12–46)
Lymphs Abs: 2.8 10*3/uL (ref 0.7–4.0)
MCH: 28.1 pg (ref 26.0–34.0)
MCHC: 32.9 g/dL (ref 30.0–36.0)
MCV: 85.2 fL (ref 78.0–100.0)
MONO ABS: 0.4 10*3/uL (ref 0.1–1.0)
MONOS PCT: 9 % (ref 3–12)
NEUTROS PCT: 19 % — AB (ref 43–77)
Neutro Abs: 0.8 10*3/uL — ABNORMAL LOW (ref 1.7–7.7)
PLATELETS: 273 10*3/uL (ref 150–400)
RBC: 4.99 MIL/uL (ref 4.22–5.81)
RDW: 14.5 % (ref 11.5–15.5)
WBC: 4.1 10*3/uL (ref 4.0–10.5)

## 2014-09-16 LAB — URINALYSIS, ROUTINE W REFLEX MICROSCOPIC
BILIRUBIN URINE: NEGATIVE
GLUCOSE, UA: NEGATIVE mg/dL
HGB URINE DIPSTICK: NEGATIVE
KETONES UR: NEGATIVE mg/dL
Leukocytes, UA: NEGATIVE
NITRITE: NEGATIVE
PH: 5 (ref 5.0–8.0)
Protein, ur: NEGATIVE mg/dL
SPECIFIC GRAVITY, URINE: 1.014 (ref 1.005–1.030)
Urobilinogen, UA: 0.2 mg/dL (ref 0.0–1.0)

## 2014-09-16 LAB — LIPASE, BLOOD: LIPASE: 25 U/L (ref 11–59)

## 2014-09-16 LAB — COMPREHENSIVE METABOLIC PANEL
ALK PHOS: 76 U/L (ref 39–117)
ALT: 19 U/L (ref 0–53)
AST: 23 U/L (ref 0–37)
Albumin: 4.2 g/dL (ref 3.5–5.2)
Anion gap: 15 (ref 5–15)
BILIRUBIN TOTAL: 0.3 mg/dL (ref 0.3–1.2)
BUN: 10 mg/dL (ref 6–23)
CALCIUM: 8.6 mg/dL (ref 8.4–10.5)
CHLORIDE: 106 meq/L (ref 96–112)
CO2: 23 meq/L (ref 19–32)
Creatinine, Ser: 0.95 mg/dL (ref 0.50–1.35)
GLUCOSE: 70 mg/dL (ref 70–99)
POTASSIUM: 4.1 meq/L (ref 3.7–5.3)
SODIUM: 144 meq/L (ref 137–147)
Total Protein: 7.9 g/dL (ref 6.0–8.3)

## 2014-09-16 LAB — RAPID URINE DRUG SCREEN, HOSP PERFORMED
AMPHETAMINES: NOT DETECTED
BARBITURATES: NOT DETECTED
BENZODIAZEPINES: NOT DETECTED
COCAINE: POSITIVE — AB
Opiates: NOT DETECTED
Tetrahydrocannabinol: POSITIVE — AB

## 2014-09-16 LAB — I-STAT CG4 LACTIC ACID, ED: Lactic Acid, Venous: 2.37 mmol/L — ABNORMAL HIGH (ref 0.5–2.2)

## 2014-09-16 MED ORDER — SODIUM CHLORIDE 0.9 % IV SOLN
Freq: Once | INTRAVENOUS | Status: AC
  Administered 2014-09-16: 16:00:00 via INTRAVENOUS

## 2014-09-16 MED ORDER — HYDROMORPHONE HCL 1 MG/ML IJ SOLN
1.0000 mg | Freq: Once | INTRAMUSCULAR | Status: AC
  Administered 2014-09-16: 1 mg via INTRAVENOUS
  Filled 2014-09-16: qty 1

## 2014-09-16 MED ORDER — HYDROCODONE-ACETAMINOPHEN 5-325 MG PO TABS: 1.0000 | ORAL_TABLET | ORAL | Status: DC | PRN

## 2014-09-16 MED ORDER — ONDANSETRON HCL 4 MG/2ML IJ SOLN
4.0000 mg | Freq: Once | INTRAMUSCULAR | Status: AC
  Administered 2014-09-16: 4 mg via INTRAVENOUS
  Filled 2014-09-16: qty 2

## 2014-09-16 MED ORDER — IOHEXOL 300 MG/ML  SOLN
100.0000 mL | Freq: Once | INTRAMUSCULAR | Status: AC | PRN
  Administered 2014-09-16: 100 mL via INTRAVENOUS

## 2014-09-16 MED ORDER — KETOROLAC TROMETHAMINE 30 MG/ML IJ SOLN
30.0000 mg | Freq: Once | INTRAMUSCULAR | Status: AC
  Administered 2014-09-16: 30 mg via INTRAVENOUS
  Filled 2014-09-16: qty 1

## 2014-09-16 NOTE — ED Notes (Signed)
Pt presents with RUQ pain 10/10, states he was told he has a hernia, surgery scheduled for the 13th.

## 2014-09-16 NOTE — ED Notes (Signed)
Pt to ED via [facility] to [facility]- [facility]- pt became upset in the lobby because his pain returned and was escorted off property.  Pt has rx for pain medication in his hands, states [facility]

## 2014-09-16 NOTE — ED Notes (Signed)
Bed: WA20 Expected date:  Expected time:  Means of arrival:  Comments: abd pain 

## 2014-09-16 NOTE — ED Notes (Signed)
Pt aware urine sample is needed, unable to give one at this time.

## 2014-09-16 NOTE — Discharge Instructions (Signed)
Cholelithiasis (GALLSTONE)  Cholelithiasis (also called gallstones) is a form of gallbladder disease. The gallbladder is a small organ that helps you digest fats. Symptoms of gallstones are:  Feeling sick to your stomach (nausea).  Throwing up (vomiting).  Belly pain.  Yellowing of the skin (jaundice).  Sudden pain. You may feel the pain for minutes to hours.  Fever.  Pain to the touch. HOME CARE  Only take medicines as told by your doctor.  Eat a low-fat diet until you see your doctor again. Eating fat can result in pain.  Follow up with your doctor as told. Attacks usually happen time after time. Surgery is usually needed for permanent treatment. GET HELP RIGHT AWAY IF:   Your pain gets worse.  Your pain is not helped by medicines.  You have a fever and lasting symptoms for more than 2-3 days.  You have a fever and your symptoms suddenly get worse.  You keep feeling sick to your stomach and throwing up. MAKE SURE YOU:   Understand these instructions.  Will watch your condition.  Will get help right away if you are not doing well or get worse. Document Released: 04/16/2008 Document Revised: 07/01/2013 Document Reviewed: 04/22/2013 [facility] Patient Information 2015 [employer] [place]. This information is not intended to replace advice given to you by your health care provider. Make sure you discuss any questions you have with your health care provider.

## 2014-09-16 NOTE — ED Provider Notes (Signed)
CSN: [id number]     Arrival date & time 09/16/14  1521 History   First MD Initiated Contact with Patient 09/16/14 1528     Chief Complaint  Patient presents with  . Abdominal Pain     (Consider location/radiation/quality/duration/timing/severity/associated sxs/prior Treatment) HPI Comments: Patient presents to the ER for evaluation of severe, constant, sharp and stabbing pain on the right side of his abdomen. Patient reports that he has been seen for this before and told he had a hernia. He reports that he is scheduled to have surgery on the 13th, but "I can't wait". Patient not experiencing vomiting, diarrhea, rectal bleeding or melena.  Patient is a 53 y.o. male presenting with abdominal pain.  Abdominal Pain   Past Medical History  Diagnosis Date  . Hypertension   . Depression   . Bipolar 1 disorder   . Schizophrenia    History reviewed. No pertinent past surgical history. History reviewed. No pertinent family history. History  Substance Use Topics  . Smoking status: Current Every Day Smoker  . Smokeless tobacco: Not on file  . Alcohol Use: No    Review of Systems  Gastrointestinal: Positive for abdominal pain.  All other systems reviewed and are negative.     Allergies  Review of patient's allergies indicates no known allergies.  Home Medications   Prior to Admission medications   Medication Sig Start Date End Date Taking? Authorizing Provider  citalopram (CELEXA) 20 MG tablet Take 20 mg by mouth daily.  09/09/14  Yes Historical Provider, MD  divalproex (DEPAKOTE ER) 250 MG 24 hr tablet Take 250 mg by mouth daily.   Yes Historical Provider, MD  gabapentin (NEURONTIN) 600 MG tablet Take 600 mg by mouth daily.   Yes Historical Provider, MD  haloperidol (HALDOL) 5 MG tablet Take 5 mg by mouth 2 (two) times daily.   Yes Historical Provider, MD  hydrochlorothiazide (HYDRODIURIL) 25 MG tablet Take 25 mg by mouth daily.   Yes Historical Provider, MD    lisinopril-hydrochlorothiazide (PRINZIDE,ZESTORETIC) 20-25 MG per tablet Take 1 tablet by mouth daily.  09/09/14  Yes Historical Provider, MD  meloxicam (MOBIC) 7.5 MG tablet Take 7.5 mg by mouth daily.  09/09/14  Yes Historical Provider, MD  HYDROcodone-acetaminophen (NORCO/VICODIN) 5-325 MG per tablet Take 1-2 tablets by mouth every 4 (four) hours as needed for moderate pain. 09/16/14   Gilda Creasehristopher J. Tremeka Helbling, MD   BP 156/87 mmHg  Pulse 74  Temp(Src) 98 F (36.7 C) (Oral)  Resp 20  SpO2 96% Physical Exam  Constitutional: He is oriented to person, place, and time. He appears well-developed and well-nourished. No distress.  HENT:  Head: Normocephalic and atraumatic.  Right Ear: Hearing normal.  Left Ear: Hearing normal.  Nose: Nose normal.  Mouth/Throat: Oropharynx is clear and moist and mucous membranes are normal.  Eyes: Conjunctivae and EOM are normal. Pupils are equal, round, and reactive to light.  Neck: Normal range of motion. Neck supple.  Cardiovascular: Regular rhythm, S1 normal and S2 normal.  Exam reveals no gallop and no friction rub.   No murmur heard. Pulmonary/Chest: Effort normal and breath sounds normal. No respiratory distress. He exhibits no tenderness.  Abdominal: Soft. Normal appearance and bowel sounds are normal. There is no hepatosplenomegaly. There is tenderness in the right upper quadrant. There is no rebound, no guarding, no tenderness at McBurney's point and negative Murphy's sign. No hernia.    Musculoskeletal: Normal range of motion.  Neurological: He is alert and oriented to person, place,  and time. He has normal strength. No cranial nerve deficit or sensory deficit. Coordination normal. GCS eye subscore is 4. GCS verbal subscore is 5. GCS motor subscore is 6.  Skin: Skin is warm, dry and intact. No rash noted. No cyanosis.  Psychiatric: He has a normal mood and affect. His speech is normal and behavior is normal. Thought content normal.    ED Course   Procedures (including critical care time) Labs Review Labs Reviewed  CBC WITH DIFFERENTIAL - Abnormal; Notable for the following:    Neutrophils Relative % 19 (*)    Lymphocytes Relative 68 (*)    Neutro Abs 0.8 (*)    All other components within normal limits  URINE RAPID DRUG SCREEN (HOSP PERFORMED) - Abnormal; Notable for the following:    Cocaine POSITIVE (*)    Tetrahydrocannabinol POSITIVE (*)    All other components within normal limits  I-STAT CG4 LACTIC ACID, ED - Abnormal; Notable for the following:    Lactic Acid, Venous 2.37 (*)    All other components within normal limits  COMPREHENSIVE METABOLIC PANEL  LIPASE, BLOOD  URINALYSIS, ROUTINE W REFLEX MICROSCOPIC  PATHOLOGIST SMEAR REVIEW    Imaging Review Ct Abdomen Pelvis W Contrast  09/16/2014   CLINICAL DATA:  Right upper quadrant pain. ICD10: R 10.11RUQ PAIN WITH GUARDING FOR SEVERAL WKS. PT STATES HE HAS A HERNIA.  EXAM: CT ABDOMEN AND PELVIS WITH CONTRAST  TECHNIQUE: Multidetector CT imaging of the abdomen and pelvis was performed using the standard protocol following bolus administration of intravenous contrast.  CONTRAST:  100mL OMNIPAQUE IOHEXOL 300 MG/ML  SOLN  COMPARISON:  09/02/2014 from [facility] and 08/25/2014 [facility].  FINDINGS: Lower chest: Clear lung bases. Normal heart size, without pericardial or pleural effusion.  Mild motion degradation involving the abdomen and pelvis.  Hepatobiliary: Normal liver. Cholelithiasis without acute cholecystitis or biliary ductal dilatation.  Pancreas: Normal, without mass or pancreatic ductal dilatation.  Spleen: Normal  Adrenals/Urinary Tract: Normal adrenal glands. Too small to characterize upper pole left renal lesion which is likely a cyst. Normal right kidney, ureters and urinary bladder.  Stomach/Bowel: Normal stomach, without wall thickening. Scattered colonic diverticula. Normal terminal ileum and appendix. Normal small bowel without abdominal  ascites.  Vascular/Lymphatic: Advanced for age aortic atherosclerosis with extension into the common iliac arteries. No abdominopelvic adenopathy. Fluid density structure along the left pelvic sidewall is similar back to 03/03/2011. 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  Reproductive: Normal prostate.  Other:  No significant free fluid.  Musculoskeletal: Partial degenerative fusion of the left sacroiliac joint.  IMPRESSION: 1. Cholelithiasis without acute cholecystitis or biliary ductal dilatation. 2. No other explanation for right upper quadrant pain. 3. Mild motion degradation. 4. Age advanced atherosclerosis. 5. 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 Unchanged back to 2012.   Electronically Signed   By: Jeronimo GreavesKyle  Talbot M.D.   On: 09/16/2014 18:54     EKG Interpretation   Date/Time:  Thursday September 16 2014 15:47:51 EST Ventricular Rate:  83 PR Interval:  171 QRS Duration: 99 QT Interval:  386 QTC Calculation: 453 R Axis:   68 Text Interpretation:  Sinus rhythm Probable left atrial enlargement Left  ventricular hypertrophy ST elev, probable normal early repol pattern  Baseline wander in lead(s) V2 V5 V6 No significant change since last  tracing Confirmed by Deriyah Kunath  MD, Jacci Ruberg [phone]) on 09/16/2014 4:06:56  PM      MDM   Final diagnoses:  Abdominal pain  Biliary colic  Patient presents to the ER for evaluation of abdominal pain. Patient reports ongoing problems with right-sided abdominal pain for a month. Patient is a very poor historian. He reports that he has been told that his pain is secondary to a hernia in the past. Examination of his abdomen did not reveal any obvious hernias. Patient did have tenderness in the right upper and right mid abdominal region with mild guarding. Lab work was unremarkable. He does not have elevated LFTs.lipase was normal. Patient does not have an elevated white blood cell count. He is afebrile.  Patient fiven IV dilaudid and only had  partial resolution of his pain. CT scan was ordered. CT was chosen because the patient stated history of hernia. Also he has pain lower than the right upper quadrant, and therefore it was felt that he might have an atypical diverticulitis or colitis. CT scan showed evidence of cholelithiasis without any acute signs of cholecystitis.  At this point case was discussed with Dr. Gerrit Friends, on call for general surgery. Patient's lab work and CT findings were discussed. I also discussed the fact the patient has not had resolution of pain with IV Dilaudid here in the ER and continues to have tenderness including borderline Murphy sign. Dr. Gerrit Friends did not feel that the patient required hospitalization at this time. He also did not feel that obtaining an emergent ultrasound with change management. He recommended scheduling the patient for outpatient ultrasound and follow-up in the office promptly. Patient was given return precautions including fever and worsening pain.  Addendum: Patient became somewhat irate at time of discharge, mainly because he does not have a ride home. I did explain to him that he needs to follow-up with the surgeon in the office for definitive treatment of his gallstone.  Gilda Crease, MD 09/17/14 [phone]

## 2014-09-16 NOTE — ED Notes (Signed)
MD at bedside. 

## 2014-09-16 NOTE — ED Notes (Signed)
[place], EDP given CG4 Lactic results.

## 2014-09-17 ENCOUNTER — Emergency Department (HOSPITAL_COMMUNITY): Payer: Medicare Other

## 2014-09-17 ENCOUNTER — Emergency Department (HOSPITAL_COMMUNITY)
Admission: EM | Admit: 2014-09-17 | Discharge: 2014-09-17 | Disposition: A | Discharge status: Home / Self Care | Payer: Medicare Other | Source: Home / Self Care | Attending: Emergency Medicine | Admitting: Emergency Medicine

## 2014-09-17 DIAGNOSIS — K8020 Calculus of gallbladder without cholecystitis without obstruction: Secondary | ICD-10-CM

## 2014-09-17 DIAGNOSIS — R1011 Right upper quadrant pain: Secondary | ICD-10-CM | POA: Diagnosis not present

## 2014-09-17 DIAGNOSIS — R1901 Right upper quadrant abdominal swelling, mass and lump: Secondary | ICD-10-CM

## 2014-09-17 LAB — PATHOLOGIST SMEAR REVIEW

## 2014-09-17 MED ORDER — HYDROMORPHONE HCL 1 MG/ML IJ SOLN
2.0000 mg | INTRAMUSCULAR | Status: DC | PRN
  Administered 2014-09-17: 2 mg via INTRAMUSCULAR
  Filled 2014-09-17: qty 2

## 2014-09-17 NOTE — ED Notes (Signed)
Patient to ultrasound

## 2014-09-17 NOTE — ED Provider Notes (Addendum)
CSN: [id number]     Arrival date & time 09/16/14  2249 History   First MD Initiated Contact with Patient 09/17/14 0526     Chief Complaint  Patient presents with  . Abdominal Pain     (Consider location/radiation/quality/duration/timing/severity/associated sxs/prior Treatment) HPI Comments: Pt comes in with cc of abd pain. Pt has been having abd pain the last few days. Right sided. Pain is fairly constant, and usually gets worse in the early morning. He doesn't think it is associated with po intake. There is hx of abd hernia. [facility] - and he had cholelithasis, case d/c with Surgery, they want outpatient workup.  Discussed findings with the patient, and he would rather have an US now, so that the surgeons can help him as soon as possible.  Patient is a 53 y.o. male presenting with abdominal pain. The history is provided by the patient.  Abdominal Pain Associated symptoms: no chest pain, no cough, no dysuria, no nausea, no shortness of breath and no vomiting     Past Medical History  Diagnosis Date  . Hypertension   . Depression   . Bipolar 1 disorder   . Schizophrenia    History reviewed. No pertinent past surgical history. No family history on file. History  Substance Use Topics  . Smoking status: Current Every Day Smoker  . Smokeless tobacco: Not on file  . Alcohol Use: No    Review of Systems  Constitutional: Negative for activity change and appetite change.  Respiratory: Negative for cough and shortness of breath.   Cardiovascular: Negative for chest pain.  Gastrointestinal: Positive for abdominal pain. Negative for nausea and vomiting.  Genitourinary: Negative for dysuria.      Allergies  Review of patient's allergies indicates no known allergies.  Home Medications   Prior to Admission medications   Medication Sig Start Date End Date Taking? Authorizing Provider  citalopram (CELEXA) 20 MG tablet Take 20 mg by  mouth daily.  09/09/14  Yes Historical Provider, MD  divalproex (DEPAKOTE ER) 250 MG 24 hr tablet Take 250 mg by mouth daily.   Yes Historical Provider, MD  gabapentin (NEURONTIN) 600 MG tablet Take 600 mg by mouth daily.   Yes Historical Provider, MD  haloperidol (HALDOL) 5 MG tablet Take 5 mg by mouth 2 (two) times daily.   Yes Historical Provider, MD  lisinopril-hydrochlorothiazide (PRINZIDE,ZESTORETIC) 20-25 MG per tablet Take 1 tablet by mouth daily.  09/09/14  Yes Historical Provider, MD  hydrochlorothiazide (HYDRODIURIL) 25 MG tablet Take 25 mg by mouth daily.    Historical Provider, MD  HYDROcodone-acetaminophen (NORCO/VICODIN) 5-325 MG per tablet Take 1-2 tablets by mouth every 4 (four) hours as needed for moderate pain. 09/16/14   Gilda Creasehristopher J. [place]ina, MD  meloxicam (MOBIC) 7.5 MG tablet Take 7.5 mg by mouth daily.  09/09/14   Historical Provider, MD   BP 163/88 mmHg  Pulse 66  Temp(Src) 97.5 F (36.4 C) (Oral)  Resp 12  Ht 5\' 6"  (1.676 m)  Wt 147 lb (66.679 kg)  BMI 23.74 kg/m2  SpO2 93% Physical Exam  Constitutional: He is oriented to person, place, and time. He appears well-developed.  HENT:  Head: Normocephalic and atraumatic.  Eyes: Conjunctivae and EOM are normal. Pupils are equal, round, and reactive to light.  Neck: Normal range of motion. Neck supple.  Cardiovascular: Normal rate and regular rhythm.   Pulmonary/Chest: Effort normal and breath sounds normal.  Abdominal: Soft. Bowel  sounds are normal. He exhibits no distension. There is tenderness. There is no rebound and no guarding.  RUQ ans right lateral tenderness. There is no mcburney's - but he has guarding on the lateral side of the abd.  Neurological: He is alert and oriented to person, place, and time.  Skin: Skin is warm.  Nursing note and vitals reviewed.   ED Course  Procedures (including critical care time) Labs Review Labs Reviewed - No data to display  Imaging Review Ct Abdomen Pelvis W  Contrast  09/16/2014   CLINICAL DATA:  Right upper quadrant pain. ICD10: R 10.11RUQ PAIN WITH GUARDING FOR SEVERAL WKS. PT STATES HE HAS A HERNIA.  EXAM: CT ABDOMEN AND PELVIS WITH CONTRAST  TECHNIQUE: Multidetector CT imaging of the abdomen and pelvis was performed using the standard protocol following bolus administration of intravenous contrast.  CONTRAST:  OMNIPAQUE IOHEXOL 300 MG/ML  SOLN  COMPARISON:  09/02/2014 [facility] and 08/[facility]/2014 [facility].  FINDINGS: Lower chest: Clear lung bases. Normal heart size, without pericardial or pleural effusion.  Mild motion degradation involving the abdomen and pelvis.  Hepatobiliary: Normal liver. Cholelithiasis without acute cholecystitis or biliary ductal dilatation.  Pancreas: Normal, without mass or pancreatic ductal dilatation.  Spleen: Normal  Adrenals/Urinary Tract: Normal adrenal glands. Too small to characterize upper pole left renal lesion which is likely a cyst. Normal right kidney, ureters and urinary bladder.  Stomach/Bowel: Normal stomach, without wall thickening. Scattered colonic diverticula. Normal terminal ileum and appendix. Normal small bowel without abdominal ascites.  Vascular/Lymphatic: Advanced for age aortic atherosclerosis with extension into the common iliac arteries. No abdominopelvic adenopathy. Fluid density structure along the left pelvic sidewall is similar back to 03/03/2011. 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  Reproductive: Normal prostate.  Other:  No significant free fluid.  Musculoskeletal: Partial degenerative fusion of the left sacroiliac joint.  IMPRESSION: 1. Cholelithiasis without acute cholecystitis or biliary ductal dilatation. 2. No other explanation for right upper quadrant pain. 3. Mild motion degradation. 4. Age advanced atherosclerosis. 5. 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 Unchanged back to 2012.   Electronically Signed   By: Jeronimo Greaves M.D.   On: 09/16/2014 18:54   US  Abdomen Limited  09/17/2014   CLINICAL DATA:  Right upper quadrant pain.  History of hypertension.  EXAM: US ABDOMEN LIMITED - RIGHT UPPER QUADRANT  COMPARISON:  CT of the abdomen and pelvis on 09/16/2014  FINDINGS: Gallbladder:  Gallbladder wall is 1.3 mm in thickness. No sonographic Murphy sign or pericholecystic fluid. Gallstone is 11.4 mm.  Common bile duct:  Diameter: 4.1 mm  Liver:  The liver appears hypoechoic raising the question of hepatic edema. No focal liver mass identified.  IMPRESSION: 1. 11.4 mm gallstone within the gallbladder. No other evidence for acute cholecystitis. 2. Hypoechoic hepatic parenchyma consistent with edema. This appearance can be seen in the setting of hepatitis.   Electronically Signed   By: Rosalie Gums M.D.   On: 09/17/2014 07:43     EKG Interpretation None      MDM   Final diagnoses:  RUQ abdominal mass  Calculus of gallbladder without cholecystitis without obstruction   Pt comes in with cc of abd pain. [place] abd ordered, since he is allegedly scheduled for procedure on 13th, and the data might help the surgeons. Pt understands that the plan is pain control if there is no emergent indication for surgery. [facility] from [facility] reviewed. Pt has pain meds and d/c paperwork in  hand from [facility], and has surgery f/u.   Derwood KaplanAnkit Kameryn Tisdel, MD 09/17/14 0750  7:55 AM Asking for food. Results discussed. Will d/c.  Derwood KaplanAnkit Kisean Rollo, MD 09/17/14 [id number]

## 2014-09-17 NOTE — Discharge Instructions (Signed)
[facility]. Take pain meds as prescribed.

## 2014-10-11 ENCOUNTER — Ambulatory Visit (INDEPENDENT_AMBULATORY_CARE_PROVIDER_SITE_OTHER): Payer: Self-pay | Admitting: Surgery

## 2014-10-11 NOTE — H&P (Signed)
[name] [name] 10/11/2014 9:38 AM Location: [facility] Patient #: [id number] DOB: 09/10/1961 Divorced / Language: [place] / Race: Black or African American Male History of Present Illness [name] [name]([name] [name] A. Mialee Weyman MD; 10/11/2014 12:46 PM) Patient words: umb hernia and gallstones pt has 2 month hx of intermittent TUQ abdominal pain sharp made better with sitting up and heat. Pt found to have gallstones on U/S. Pt has umbilical hernia. Pain sharp worse at night made worse with eating.       CLINICAL DATA: Right upper quadrant pain. History of hypertension.  EXAM: US ABDOMEN LIMITED - RIGHT UPPER QUADRANT  COMPARISON: CT of the abdomen and pelvis on 09/16/2014  FINDINGS: Gallbladder:  Gallbladder wall is 1.3 mm in thickness. No sonographic Murphy sign or pericholecystic fluid. Gallstone is 11.4 mm.  Common bile duct:  Diameter: 4.1 mm  Liver:  The liver appears hypoechoic raising the question of hepatic edema. No focal liver mass identified.  IMPRESSION: 1. 11.4 mm gallstone within the gallbladder. No other evidence for acute cholecystitis. 2. Hypoechoic hepatic parenchyma consistent with edema. This appearance can be seen in the setting of hepatitis.   Electronically Signed By: Rosalie GumsBeth Brown M.D. On: 09/17/2014 07:43               CLINICAL DATA: Right upper quadrant pain. History of hypertension.  EXAM: US ABDOMEN LIMITED - RIGHT UPPER QUADRANT  COMPARISON: CT of the abdomen and pelvis on 09/16/2014  FINDINGS: Gallbladder:  Gallbladder wall is 1.3 mm in thickness. No sonographic Murphy sign or pericholecystic fluid. Gallstone is 11.4 mm.  Common bile duct:  Diameter: 4.1 mm  Liver:  The liver appears hypoechoic raising the question of hepatic edema. No focal liver mass identified.  IMPRESSION: 1. 11.4 mm gallstone within the gallbladder. No other evidence for acute cholecystitis. 2. Hypoechoic hepatic parenchyma consistent  with edema. This appearance can be seen in the setting of hepatitis.   Electronically Signed By: Rosalie GumsBeth Brown M.D. On: 09/17/2014 07:43    Results for [name] [name], [name] [name] (MRN [id number]) as of 10/11/2014 10:01 Ref. Range 09/16/2014 17:09 Sodium Latest Range: 137-147 mEq/L 144 Potassium Latest Range: 3.7-5.3 mEq/L 4.1 Chloride Latest Range: 96-112 mEq/L 106 CO2 Latest Range: 19-32 mEq/L 23 BUN Latest Range: 6-23 mg/dL 10 Creatinine Latest Range: 0.50-1.35 mg/dL 9.140.95 Calcium Latest Range: 8.4-10.5 mg/dL 8.6 GFR calc non Af Amer Latest Range: >90 mL/min >90 GFR calc Af Amer Latest Range: >90 mL/min >90 Glucose Latest Range: 70-99 mg/dL 70 Anion gap Latest Range: 5-15 15 Alkaline Phosphatase Latest Range: 39-117 U/L 76 Albumin Latest Range: 3.5-5.2 g/dL 4.2 Lipase Latest Range: 11-59 U/L 25 AST Latest Range: 0-37 U/L 23 ALT Latest Range: 0-53 U/L 19 Total Protein Latest Range: 6.0-8.3 g/dL 7.9 Total Bilirubin Latest Range: 0.3-1.2 mg/dL 0.3 WBC Latest Range: 7.8-29.54.0-10.5 K/uL 4.1 RBC Latest Range: 4.22-5.81 MIL/uL 4.99 Hemoglobin Latest Range: 13.0-17.0 g/dL 62.114.0 HCT Latest Range: 39.0-52.0 % 42.5 MCV Latest Range: 78.0-100.0 fL 85.2 MCH Latest Range: 26.0-34.0 pg 28.1 MCHC Latest Range: 30.0-36.0 g/dL 30.832.9 RDW Latest Range: 11.5-15.5 % 14.5 Platelets Latest Range: 150-400 K/uL 273.  The patient is a 53 year old male   Other Problems [name] [name]([name] [name], CMA; 10/11/2014 9:38 AM) Alcohol Abuse Back Pain High blood pressure Kidney Stone  Past Surgical History [name] [name]([name] [name], CMA; 10/11/2014 9:38 AM) Knee Surgery Right.  Allergies ([name] [name], CMA; 10/11/2014 9:39 AM) No Known Drug Allergies 10/11/2014  Medication History ([name] [name], CMA; 10/11/2014 9:44 AM) Citalopram Hydrobromide (20MG  Tablet, Oral) Active. Cyclobenzaprine HCl (10MG   Tablet, Oral) Active. Gabapentin (300MG  Capsule, Oral) Active. Lisinopril-Hydrochlorothiazide (20-25MG  Tablet, Oral) Active. Meloxicam  (7.5MG  Tablet, Oral) Active. Gabapentin (600MG  Tablet, Oral) Active.  Social History [name] [name]([name] [name], CMA; 10/11/2014 9:38 AM) Alcohol use Recently quit alcohol use. Caffeine use Coffee. Illicit drug use Uses monthly. Tobacco use Current every day smoker.  Family History [name] [name]([name] [name], CMA; 10/11/2014 9:38 AM) Alcohol Abuse Family Members In General.     Review of Systems [name] [name]([name] [name] CMA; 10/11/2014 9:38 AM) General Present- Weight Loss. Not Present- Appetite Loss, Chills, Fatigue, Fever, Night Sweats and Weight Gain. Skin Not Present- Change in Wart/Mole, Dryness, Hives, Jaundice, New Lesions, Non-Healing Wounds, Rash and Ulcer. HEENT Not Present- Earache, Hearing Loss, Hoarseness, Nose Bleed, Oral Ulcers, Ringing in the Ears, Seasonal Allergies, Sinus Pain, Sore Throat, Visual Disturbances, Wears glasses/contact lenses and Yellow Eyes. Respiratory Not Present- Bloody sputum, Chronic Cough, Difficulty Breathing, Snoring and Wheezing. Cardiovascular Present- Shortness of Breath. Not Present- Chest Pain, Difficulty Breathing Lying Down, Leg Cramps, Palpitations, Rapid Heart Rate and Swelling of Extremities. Musculoskeletal Present- Back Pain. Not Present- Joint Pain, Joint Stiffness, Muscle Pain, Muscle Weakness and Swelling of Extremities.  Vitals ([name] [name] CMA; 10/11/2014 9:43 AM) 10/11/2014 9:43 AM Weight: 165 lb Height: 66in Body Surface Area: 1.87 m Body Mass Index: 26.63 kg/m Temp.: 32F(Temporal)  Pulse: 79 (Regular)  BP: 130/84 (Sitting, Left Arm, Standard)     Physical Exam ([name] Stehle A. Liam Bossman MD; 10/11/2014 12:44 PM)  General Mental Status-Alert. General Appearance-Consistent with stated age. Hydration-Well hydrated. Voice-Normal.  Head and Neck Head-normocephalic, atraumatic with no lesions or palpable masses.  Eye Eyeball - Bilateral-Extraocular movements intact. Sclera/Conjunctiva - Bilateral-No scleral icterus.  Chest and  Lung Exam Chest and lung exam reveals -quiet, even and easy respiratory effort with no use of accessory muscles and on auscultation, normal breath sounds, no adventitious sounds and normal vocal resonance. Inspection Chest Wall - Normal. Back - normal.  Cardiovascular Cardiovascular examination reveals -on palpation PMI is normal in location and amplitude, no palpable S3 or S4. Normal cardiac borders., normal heart sounds, regular rate and rhythm with no murmurs, carotid auscultation reveals no bruits and normal pedal pulses bilaterally.  Abdomen Inspection Inspection of the abdomen reveals - No Hernias. Skin - Scar - no surgical scars. Palpation/Percussion Palpation and Percussion of the abdomen reveal - Soft, Non Tender, No Rebound tenderness, No Rigidity (guarding) and No hepatosplenomegaly. Auscultation Auscultation of the abdomen reveals - Bowel sounds normal. Note: small reducible umbilical hernia   Neurologic Neurologic evaluation reveals -alert and oriented x 3 with no impairment of recent or remote memory. Mental Status-Normal.  Musculoskeletal Normal Exam - Left-Upper Extremity Strength Normal and Lower Extremity Strength Normal. Normal Exam - Right-Upper Extremity Strength Normal, Lower Extremity Weakness.    Assessment & Plan (Kimberla Driskill A. 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 The procedure has been discussed with the patient. Risks of laparoscopic cholecystectomy include bleeding, infection, bile duct injury, leak, death, open surgery, diarrhea, other surgery, organ injury, blood vessel injury, DVT, and additional care.  Current Plans Pt Education - Cholecystectomy: Gallbladder Removal with Open Surgery: laparoscopic cholecystectomy Pt Education - Dietary Approaches to Stop  Hypertension (The DASH Diet): dietary approaches to stop hypertension Started Percocet 7.5-325MG , 1 (one) Tablet four times daily, as needed, #30, 10/11/2014, No Refill.

## 2014-11-01 ENCOUNTER — Telehealth (INDEPENDENT_AMBULATORY_CARE_PROVIDER_SITE_OTHER): Payer: Self-pay

## 2014-11-01 NOTE — Telephone Encounter (Signed)
Try different hospital if that's the case.  Im not giving him any more pain meds.  Can come to [facility] if necessary.

## 2014-11-01 NOTE — Telephone Encounter (Signed)
Pt called to request refill on Percocet, 7.5/325.  It was last filled on 11/30 with #30 tabs.  He stated he hurts so bad, at level 10, that he cannot sleep at night. He had requested some on Dec 18, but MD had already left for the day.  He has chronic cholecystitis with calculus. We will send Dr.  a message and someone will let him know when we receive a response.  He verbalized understanding.

## 2014-11-01 NOTE — Telephone Encounter (Signed)
Go to ED if pain that bad

## 2014-11-01 NOTE — Telephone Encounter (Signed)
Pt states he went to the ED last night for pain.  He had a CT last night at [facility]. This was the second time he was evaluated at that facility. They told him nothing was wrong and called the police to take him away from the hospital.

## 2014-11-02 NOTE — Telephone Encounter (Signed)
Contacted pt with the message that he could go to a different ER like [facility] or [facility].  Dr.  will not be able to refill pain medications again.  Pt verbalized understanding and asked about surgery date.  His call was transferred to the surgery scheduler at this point. SL

## 2014-12-06 ENCOUNTER — Other Ambulatory Visit (HOSPITAL_COMMUNITY): Payer: Medicare Other

## 2014-12-06 ENCOUNTER — Inpatient Hospital Stay (HOSPITAL_COMMUNITY)
Admission: RE | Admit: 2014-12-06 | Discharge: 2014-12-06 | Disposition: A | Discharge status: Home / Self Care | Payer: Medicare Other | Source: Ambulatory Visit

## 2014-12-06 NOTE — Pre-Procedure Instructions (Signed)
[name] [name]  12/06/2014   Your procedure is scheduled on:  Tuesday, February 2nd  [facility] at 530 AM.  Call this number if you have problems the morning of surgery: [phone]   Remember:   Do not eat food or drink liquids after midnight.   Take these medicines the morning of surgery with A SIP OF WATER: depakote, celexa, neurontin, haldol, pain medication if needed   Do not wear jewelry  Do not wear lotions, powders, or perfumes  Do not shave 48 hours prior to surgery. Men may shave face and neck.  Do not bring valuables to the hospital.  [facility] is not responsible  for any belongings or valuables.               Contacts, dentures or bridgework may not be worn into surgery.  Leave suitcase in the car. After surgery it may be brought to your room.  For patients admitted to the hospital, discharge time is determined by your  treatment team.               Patients discharged the day of surgery will not be allowed to drive home.  Please read over the following fact sheets that you were given: Pain Booklet, Coughing and Deep Breathing and Surgical Site Infection Prevention  [facility] - Preparing for Surgery  Before surgery, you can play an important role.  Because skin is not sterile, your skin needs to be as free of germs as possible.  You can reduce the number of germs on you skin by washing with CHG (chlorahexidine gluconate) soap before surgery.  CHG is an antiseptic cleaner which kills germs and bonds with the skin to continue killing germs even after washing.  Please DO NOT use if you have an allergy to CHG or antibacterial soaps.  If your skin becomes reddened/irritated stop using the CHG and inform your nurse when you arrive at Short Stay.  Do not shave (including legs and underarms) for at least 48 hours prior to the first CHG shower.  You may shave your face.  Please follow these instructions carefully:   1.  Shower with CHG Soap the  night before surgery and the morning of Surgery.  2.  If you choose to wash your hair, wash your hair first as usual with your normal shampoo.  3.  After you shampoo, rinse your hair and body thoroughly to remove the shampoo.  4.  Use CHG as you would any other liquid soap.  You can apply CHG directly to the skin and wash gently with scrungie or a clean washcloth.  5.  Apply the CHG Soap to your body ONLY FROM THE NECK DOWN.  Do not use on open wounds or open sores.  Avoid contact with your eyes, ears, mouth and genitals (private parts).  Wash genitals (private parts) with your normal soap.  6.  Wash thoroughly, paying special attention to the area where your surgery will be performed.  7.  Thoroughly rinse your body with warm water from the neck down.  8.  DO NOT shower/wash with your normal soap after using and rinsing off the CHG Soap.  9.  Pat yourself dry with a clean towel.            10.  Wear clean pajamas.            11.  Place clean sheets on your bed the night of your first shower and  do not sleep with pets.  Day of Surgery  Do not apply any lotions/deoderants the morning of surgery.  Please wear clean clothes to the hospital/surgery center.

## 2014-12-13 ENCOUNTER — Encounter (HOSPITAL_COMMUNITY): Payer: Self-pay | Admitting: *Deleted

## 2014-12-13 MED ORDER — CEFAZOLIN SODIUM-DEXTROSE 2-3 GM-% IV SOLR
2.0000 g | INTRAVENOUS | Status: AC
  Administered 2014-12-14: 2 g via INTRAVENOUS
  Filled 2014-12-13: qty 50

## 2014-12-13 NOTE — Progress Notes (Signed)
Mr [name] [name] can not see well enough to read prescriptions to me.  [place] [facility] [place]- they reported pt taking Celexa, Gabapention, Haldol, Lisinopril- Hydrothiazide- hasn't pick up prescription since Oct 2015.  I called  Dr Quita SkyeMitchell's office, who is listed as PCP and patient confirmed, I was told patient is not a patient of their's.   Only medication that patient reports taking in am and that [facility] has listed is Gabapentin 600mg . I instructed patient to take Gabapentin only, with a sip of water.  "I cant have coffee/"  I told patient only water and just enough to swallow pill and if he should eat or drink coffee that the surgery would be postponed. " I want this surgery, I am tired of hurting, I wont eat or drink."

## 2014-12-14 ENCOUNTER — Ambulatory Visit (HOSPITAL_COMMUNITY): Payer: Medicare Other

## 2014-12-14 ENCOUNTER — Ambulatory Visit (HOSPITAL_COMMUNITY): Payer: Medicare Other | Admitting: Vascular Surgery

## 2014-12-14 ENCOUNTER — Encounter (HOSPITAL_COMMUNITY)
Admission: RE | Disposition: A | Discharge status: Home / Self Care | Payer: Self-pay | Source: Ambulatory Visit | Attending: Surgery

## 2014-12-14 ENCOUNTER — Encounter (HOSPITAL_COMMUNITY): Payer: Self-pay | Admitting: *Deleted

## 2014-12-14 ENCOUNTER — Ambulatory Visit (HOSPITAL_COMMUNITY)
Admission: RE | Admit: 2014-12-14 | Discharge: 2014-12-14 | Disposition: A | Discharge status: Home / Self Care | Payer: Medicare Other | Source: Ambulatory Visit | Attending: Surgery | Admitting: Surgery

## 2014-12-14 DIAGNOSIS — Z87442 Personal history of urinary calculi: Secondary | ICD-10-CM | POA: Diagnosis not present

## 2014-12-14 DIAGNOSIS — I10 Essential (primary) hypertension: Secondary | ICD-10-CM | POA: Diagnosis not present

## 2014-12-14 DIAGNOSIS — F319 Bipolar disorder, unspecified: Secondary | ICD-10-CM | POA: Insufficient documentation

## 2014-12-14 DIAGNOSIS — F329 Major depressive disorder, single episode, unspecified: Secondary | ICD-10-CM | POA: Diagnosis not present

## 2014-12-14 DIAGNOSIS — K811 Chronic cholecystitis: Secondary | ICD-10-CM

## 2014-12-14 DIAGNOSIS — F1010 Alcohol abuse, uncomplicated: Secondary | ICD-10-CM | POA: Diagnosis not present

## 2014-12-14 DIAGNOSIS — F17200 Nicotine dependence, unspecified, uncomplicated: Secondary | ICD-10-CM | POA: Insufficient documentation

## 2014-12-14 DIAGNOSIS — K8010 Calculus of gallbladder with chronic cholecystitis without obstruction: Secondary | ICD-10-CM | POA: Insufficient documentation

## 2014-12-14 HISTORY — DX: Unspecified osteoarthritis, unspecified site: M19.90

## 2014-12-14 HISTORY — PX: CHOLECYSTECTOMY: SHX55

## 2014-12-14 LAB — CBC WITH DIFFERENTIAL/PLATELET
BASOS ABS: 0 10*3/uL (ref 0.0–0.1)
Basophils Relative: 0 % (ref 0–1)
EOS ABS: 0.1 10*3/uL (ref 0.0–0.7)
Eosinophils Relative: 3 % (ref 0–5)
HCT: 43.1 % (ref 39.0–52.0)
Hemoglobin: 14.3 g/dL (ref 13.0–17.0)
LYMPHS PCT: 25 % (ref 12–46)
Lymphs Abs: 1.2 10*3/uL (ref 0.7–4.0)
MCH: 27.9 pg (ref 26.0–34.0)
MCHC: 33.2 g/dL (ref 30.0–36.0)
MCV: 84.2 fL (ref 78.0–100.0)
MONO ABS: 0.3 10*3/uL (ref 0.1–1.0)
MONOS PCT: 6 % (ref 3–12)
NEUTROS ABS: 3.2 10*3/uL (ref 1.7–7.7)
NEUTROS PCT: 66 % (ref 43–77)
Platelets: 259 10*3/uL (ref 150–400)
RBC: 5.12 MIL/uL (ref 4.22–5.81)
RDW: 14 % (ref 11.5–15.5)
WBC: 4.9 10*3/uL (ref 4.0–10.5)

## 2014-12-14 LAB — COMPREHENSIVE METABOLIC PANEL
ALT: 21 U/L (ref 0–53)
ANION GAP: 8 (ref 5–15)
AST: 24 U/L (ref 0–37)
Albumin: 4.2 g/dL (ref 3.5–5.2)
Alkaline Phosphatase: 74 U/L (ref 39–117)
BUN: 8 mg/dL (ref 6–23)
CALCIUM: 9.2 mg/dL (ref 8.4–10.5)
CHLORIDE: 110 mmol/L (ref 96–112)
CO2: 24 mmol/L (ref 19–32)
CREATININE: 1.06 mg/dL (ref 0.50–1.35)
GFR, EST NON AFRICAN AMERICAN: 79 mL/min — AB (ref >90)
GLUCOSE: 97 mg/dL (ref 70–99)
Potassium: 4.3 mmol/L (ref 3.5–5.1)
SODIUM: 142 mmol/L (ref 135–145)
TOTAL PROTEIN: 7.7 g/dL (ref 6.0–8.3)
Total Bilirubin: 0.3 mg/dL (ref 0.3–1.2)

## 2014-12-14 SURGERY — LAPAROSCOPIC CHOLECYSTECTOMY WITH INTRAOPERATIVE CHOLANGIOGRAM
Anesthesia: General | Site: Abdomen

## 2014-12-14 MED ORDER — SODIUM CHLORIDE 0.9 % IR SOLN
Status: DC | PRN
  Administered 2014-12-14 (×2): 1000 mL

## 2014-12-14 MED ORDER — ONDANSETRON HCL 4 MG/2ML IJ SOLN
INTRAMUSCULAR | Status: AC
  Filled 2014-12-14: qty 2

## 2014-12-14 MED ORDER — PROPOFOL 10 MG/ML IV BOLUS
INTRAVENOUS | Status: AC
  Filled 2014-12-14: qty 20

## 2014-12-14 MED ORDER — GLYCOPYRROLATE 0.2 MG/ML IJ SOLN
INTRAMUSCULAR | Status: DC | PRN
  Administered 2014-12-14: 0.4 mg via INTRAVENOUS

## 2014-12-14 MED ORDER — CHLORHEXIDINE GLUCONATE 4 % EX LIQD
1.0000 "application " | Freq: Once | CUTANEOUS | Status: DC
  Filled 2014-12-14: qty 15

## 2014-12-14 MED ORDER — MIDAZOLAM HCL 2 MG/2ML IJ SOLN
INTRAMUSCULAR | Status: AC
  Filled 2014-12-14: qty 2

## 2014-12-14 MED ORDER — MEPERIDINE HCL 25 MG/ML IJ SOLN: 6.2500 mg | INTRAMUSCULAR | Status: DC | PRN

## 2014-12-14 MED ORDER — PROPOFOL 10 MG/ML IV BOLUS
INTRAVENOUS | Status: DC | PRN
  Administered 2014-12-14: 120 mg via INTRAVENOUS

## 2014-12-14 MED ORDER — LIDOCAINE HCL (CARDIAC) 20 MG/ML IV SOLN
INTRAVENOUS | Status: AC
  Filled 2014-12-14: qty 5

## 2014-12-14 MED ORDER — LIDOCAINE HCL (CARDIAC) 20 MG/ML IV SOLN
INTRAVENOUS | Status: DC | PRN
  Administered 2014-12-14: 100 mg via INTRAVENOUS

## 2014-12-14 MED ORDER — ONDANSETRON HCL 4 MG/2ML IJ SOLN
INTRAMUSCULAR | Status: DC | PRN
  Administered 2014-12-14: 4 mg via INTRAVENOUS

## 2014-12-14 MED ORDER — HYDROCHLOROTHIAZIDE 25 MG PO TABS
25.0000 mg | ORAL_TABLET | ORAL | Status: AC
  Administered 2014-12-14: 25 mg via ORAL
  Filled 2014-12-14: qty 1

## 2014-12-14 MED ORDER — ROCURONIUM BROMIDE 100 MG/10ML IV SOLN
INTRAVENOUS | Status: DC | PRN
Start: 2014-12-14 — End: 2014-12-14
  Administered 2014-12-14: 50 mg via INTRAVENOUS

## 2014-12-14 MED ORDER — LISINOPRIL 20 MG PO TABS
20.0000 mg | ORAL_TABLET | ORAL | Status: AC
  Administered 2014-12-14: 20 mg via ORAL
  Filled 2014-12-14: qty 1

## 2014-12-14 MED ORDER — HYDROMORPHONE HCL 1 MG/ML IJ SOLN
INTRAMUSCULAR | Status: DC
Start: 2014-12-14 — End: 2014-12-14
  Filled 2014-12-14: qty 1

## 2014-12-14 MED ORDER — 0.9 % SODIUM CHLORIDE (POUR BTL) OPTIME
TOPICAL | Status: DC | PRN
  Administered 2014-12-14: 1000 mL

## 2014-12-14 MED ORDER — ONDANSETRON HCL 4 MG/2ML IJ SOLN
4.0000 mg | Freq: Once | INTRAMUSCULAR | Status: AC | PRN
  Administered 2014-12-14: 4 mg via INTRAVENOUS

## 2014-12-14 MED ORDER — HYDROMORPHONE HCL 1 MG/ML IJ SOLN
INTRAMUSCULAR | Status: AC
  Filled 2014-12-14: qty 1

## 2014-12-14 MED ORDER — HYDROMORPHONE HCL 1 MG/ML IJ SOLN
0.2500 mg | INTRAMUSCULAR | Status: DC | PRN
  Administered 2014-12-14 (×4): 0.5 mg via INTRAVENOUS

## 2014-12-14 MED ORDER — BUPIVACAINE-EPINEPHRINE (PF) 0.25% -1:200000 IJ SOLN
INTRAMUSCULAR | Status: AC
  Filled 2014-12-14: qty 30

## 2014-12-14 MED ORDER — NEOSTIGMINE METHYLSULFATE 10 MG/10ML IV SOLN
INTRAVENOUS | Status: DC | PRN
  Administered 2014-12-14: 3 mg via INTRAVENOUS

## 2014-12-14 MED ORDER — ROCURONIUM BROMIDE 50 MG/5ML IV SOLN
INTRAVENOUS | Status: AC
  Filled 2014-12-14: qty 1

## 2014-12-14 MED ORDER — BUPIVACAINE-EPINEPHRINE 0.25% -1:200000 IJ SOLN
INTRAMUSCULAR | Status: DC | PRN
  Administered 2014-12-14: 8 mL

## 2014-12-14 MED ORDER — FENTANYL CITRATE 0.05 MG/ML IJ SOLN
INTRAMUSCULAR | Status: DC | PRN
  Administered 2014-12-14 (×2): 50 ug via INTRAVENOUS
  Administered 2014-12-14: 150 ug via INTRAVENOUS

## 2014-12-14 MED ORDER — NEOSTIGMINE METHYLSULFATE 10 MG/10ML IV SOLN
INTRAVENOUS | Status: AC
  Filled 2014-12-14: qty 1

## 2014-12-14 MED ORDER — FENTANYL CITRATE 0.05 MG/ML IJ SOLN
INTRAMUSCULAR | Status: AC
  Filled 2014-12-14: qty 5

## 2014-12-14 MED ORDER — LACTATED RINGERS IV SOLN
INTRAVENOUS | Status: DC | PRN
  Administered 2014-12-14: 07:00:00 via INTRAVENOUS

## 2014-12-14 MED ORDER — IOHEXOL 300 MG/ML  SOLN
INTRAMUSCULAR | Status: DC | PRN
  Administered 2014-12-14: 12 mL

## 2014-12-14 MED ORDER — GLYCOPYRROLATE 0.2 MG/ML IJ SOLN
INTRAMUSCULAR | Status: AC
  Filled 2014-12-14: qty 3

## 2014-12-14 MED ORDER — MIDAZOLAM HCL 5 MG/5ML IJ SOLN
INTRAMUSCULAR | Status: DC | PRN
  Administered 2014-12-14 (×2): 1 mg via INTRAVENOUS

## 2014-12-14 MED ORDER — OXYCODONE-ACETAMINOPHEN 5-325 MG PO TABS: 1.0000 | ORAL_TABLET | ORAL | Status: DC | PRN

## 2014-12-14 SURGICAL SUPPLY — 49 items
APPLIER CLIP ROT 10 11.4 M/L (STAPLE) ×2
BLADE SURG ROTATE 9660 (MISCELLANEOUS) ×2 IMPLANT
CANISTER SUCTION 2500CC (MISCELLANEOUS) ×2 IMPLANT
CHLORAPREP W/TINT 26ML (MISCELLANEOUS) ×2 IMPLANT
CLIP APPLIE ROT 10 11.4 M/L (STAPLE) ×1 IMPLANT
COVER MAYO STAND STRL (DRAPES) ×2 IMPLANT
COVER SURGICAL LIGHT HANDLE (MISCELLANEOUS) ×2 IMPLANT
DRAPE C-ARM 42X72 X-RAY (DRAPES) ×2 IMPLANT
DRAPE LAPAROSCOPIC ABDOMINAL (DRAPES) ×2 IMPLANT
DRAPE WARM FLUID 44X44 (DRAPE) IMPLANT
ELECT REM PT RETURN 9FT ADLT (ELECTROSURGICAL) ×2
ELECTRODE REM PT RTRN 9FT ADLT (ELECTROSURGICAL) ×1 IMPLANT
GLOVE BIO SURGEON STRL SZ8 (GLOVE) ×2 IMPLANT
GLOVE BIOGEL PI IND STRL 6.5 (GLOVE) ×1 IMPLANT
GLOVE BIOGEL PI IND STRL 7.0 (GLOVE) ×1 IMPLANT
GLOVE BIOGEL PI IND STRL 7.5 (GLOVE) ×1 IMPLANT
GLOVE BIOGEL PI IND STRL 8 (GLOVE) ×3 IMPLANT
GLOVE BIOGEL PI INDICATOR 6.5 (GLOVE) ×1
GLOVE BIOGEL PI INDICATOR 7.0 (GLOVE) ×1
GLOVE BIOGEL PI INDICATOR 7.5 (GLOVE) ×1
GLOVE BIOGEL PI INDICATOR 8 (GLOVE) ×3
GLOVE ECLIPSE 8.0 STRL XLNG CF (GLOVE) ×2 IMPLANT
GLOVE SURG SS PI 7.5 STRL IVOR (GLOVE) ×2 IMPLANT
GLOVE SURG SS PI 8.0 STRL IVOR (GLOVE) ×2 IMPLANT
GOWN STRL REUS W/ TWL LRG LVL3 (GOWN DISPOSABLE) ×2 IMPLANT
GOWN STRL REUS W/ TWL XL LVL3 (GOWN DISPOSABLE) ×4 IMPLANT
GOWN STRL REUS W/TWL LRG LVL3 (GOWN DISPOSABLE) ×2
GOWN STRL REUS W/TWL XL LVL3 (GOWN DISPOSABLE) ×4
KIT BASIN OR (CUSTOM PROCEDURE TRAY) ×2 IMPLANT
KIT ROOM TURNOVER OR (KITS) ×2 IMPLANT
LIQUID BAND (GAUZE/BANDAGES/DRESSINGS) ×2 IMPLANT
NS IRRIG 1000ML POUR BTL (IV SOLUTION) ×2 IMPLANT
PAD ARMBOARD 7.5X6 YLW CONV (MISCELLANEOUS) ×2 IMPLANT
POUCH SPECIMEN RETRIEVAL 10MM (ENDOMECHANICALS) ×2 IMPLANT
SCISSORS LAP 5X35 DISP (ENDOMECHANICALS) ×2 IMPLANT
SET CHOLANGIOGRAPH 5 50 .035 (SET/KITS/TRAYS/PACK) ×2 IMPLANT
SET IRRIG TUBING LAPAROSCOPIC (IRRIGATION / IRRIGATOR) ×2 IMPLANT
SLEEVE ENDOPATH XCEL 5M (ENDOMECHANICALS) ×2 IMPLANT
SPECIMEN JAR SMALL (MISCELLANEOUS) ×2 IMPLANT
SUT MNCRL AB 4-0 PS2 18 (SUTURE) ×2 IMPLANT
SUT VIC AB 3-0 SH 27 (SUTURE) ×1
SUT VIC AB 3-0 SH 27XBRD (SUTURE) ×1 IMPLANT
TOWEL OR 17X24 6PK STRL BLUE (TOWEL DISPOSABLE) ×2 IMPLANT
TOWEL OR 17X26 10 PK STRL BLUE (TOWEL DISPOSABLE) ×2 IMPLANT
TRAY LAPAROSCOPIC (CUSTOM PROCEDURE TRAY) ×2 IMPLANT
TROCAR XCEL BLUNT TIP 100MML (ENDOMECHANICALS) ×2 IMPLANT
TROCAR XCEL NON-BLD 11X100MML (ENDOMECHANICALS) ×2 IMPLANT
TROCAR XCEL NON-BLD 5MMX100MML (ENDOMECHANICALS) ×2 IMPLANT
TUBING INSUFFLATION (TUBING) ×2 IMPLANT

## 2014-12-14 NOTE — Anesthesia Procedure Notes (Signed)
Procedure Name: Intubation Date/Time: 12/14/2014 7:44 AM Performed by: Charm BargesBUTLER, Garik Diamant R Pre-anesthesia Checklist: Patient identified, Emergency Drugs available, Suction available, Patient being monitored and Timeout performed Patient Re-evaluated:Patient Re-evaluated prior to inductionOxygen Delivery Method: Circle system utilized Preoxygenation: Pre-oxygenation with 100% oxygen Intubation Type: IV induction Ventilation: Mask ventilation without difficulty Laryngoscope Size: Mac and 3 Grade View: Grade I Tube type: Oral Tube size: 7.5 mm Number of attempts: 1 Airway Equipment and Method: Stylet Placement Confirmation: ETT inserted through vocal cords under direct vision and positive ETCO2 Secured at: 23 cm Tube secured with: Tape Dental Injury: Teeth and Oropharynx as per pre-operative assessment

## 2014-12-14 NOTE — Transfer of Care (Signed)
Immediate Anesthesia Transfer of Care Note  Patient: [name] SproutAnthony [name]  Procedure(s) Performed: Procedure(s): LAPAROSCOPIC CHOLECYSTECTOMY WITH INTRAOPERATIVE CHOLANGIOGRAM (N/A)  Patient Location: PACU  Anesthesia Type:General  Level of Consciousness: awake, alert  and oriented  Airway & Oxygen Therapy: Patient Spontanous Breathing and Patient connected to nasal cannula oxygen  Post-op Assessment: Report given to RN, Post -op Vital signs reviewed and stable and Patient moving all extremities  Post vital signs: Reviewed and stable  Last Vitals:  Filed Vitals:   12/14/14 0632  BP: 171/119  Pulse: 69  Temp: 36.5 C  Resp: 18    Complications: No apparent anesthesia complications

## 2014-12-14 NOTE — Anesthesia Preprocedure Evaluation (Addendum)
Anesthesia Evaluation  Patient identified by MRN, date of birth, ID band Patient awake    Reviewed: Allergy & Precautions, NPO status , Patient's Chart, lab work & pertinent test results  Airway Mallampati: II  TM Distance: >3 FB Neck ROM: Full    Dental  (+) Teeth Intact, Dental Advisory Given   Pulmonary Current Smoker,          Cardiovascular hypertension, Pt. on medications     Neuro/Psych PSYCHIATRIC DISORDERS Depression Bipolar Disorder Schizophrenia    GI/Hepatic   Endo/Other    Renal/GU      Musculoskeletal   Abdominal   Peds  Hematology   Anesthesia Other Findings   Reproductive/Obstetrics                            Anesthesia Physical Anesthesia Plan  ASA: II  Anesthesia Plan: General   Post-op Pain Management:    Induction: Intravenous  Airway Management Planned: Oral ETT  Additional Equipment: None  Intra-op Plan:   Post-operative Plan: Extubation in OR  Informed Consent: I have reviewed the patients History and Physical, chart, labs and discussed the procedure including the risks, benefits and alternatives for the proposed anesthesia with the patient or authorized representative who has indicated his/her understanding and acceptance.   Dental advisory given  Plan Discussed with: CRNA and Surgeon  Anesthesia Plan Comments:        Anesthesia Quick Evaluation

## 2014-12-14 NOTE — Discharge Instructions (Signed)
[facility] ______[facility] °LAPAROSCOPIC SURGERY: POST OP INSTRUCTIONS °Always review your discharge instruction sheet given to you by the facility where your surgery was performed. °IF YOU HAVE DISABILITY OR FAMILY LEAVE FORMS, YOU MUST BRING THEM TO THE OFFICE FOR PROCESSING.   °DO NOT GIVE THEM TO YOUR DOCTOR. ° °1. A prescription for pain medication may be given to you upon discharge.  Take your pain medication as prescribed, if needed.  If narcotic pain medicine is not needed, then you may take acetaminophen (Tylenol) or ibuprofen (Advil) as needed. °2. Take your usually prescribed medications unless otherwise directed. °3. If you need a refill on your pain medication, please contact your pharmacy.  They will contact our office to request authorization. Prescriptions will not be filled after 5pm or on week-ends. °4. You should follow a light diet the first few days after arrival home, such as soup and crackers, etc.  Be sure to include lots of fluids daily. °5. Most patients will experience some swelling and bruising in the area of the incisions.  Ice packs will help.  Swelling and bruising can take several days to resolve.  °6. It is common to experience some constipation if taking pain medication after surgery.  Increasing fluid intake and taking a stool softener (such as Colace) will usually help or prevent this problem from occurring.  A mild laxative (Milk of Magnesia or Miralax) should be taken according to package instructions if there are no bowel movements after 48 hours. °7. Unless discharge instructions indicate otherwise, you may remove your bandages 24-48 hours after surgery, and you may shower at that time.  You may have steri-strips (small skin tapes) in place directly over the incision.  These strips should be left on the skin for 7-10 days.  If your surgeon used skin glue on the incision, you may shower in 24 hours.  The glue will flake off over the next 2-3 weeks.  Any sutures or  staples will be removed at the office during your follow-up visit. °8. ACTIVITIES:  You may resume regular (light) daily activities beginning the next day--such as daily self-care, walking, climbing stairs--gradually increasing activities as tolerated.  You may have sexual intercourse when it is comfortable.  Refrain from any heavy lifting or straining until approved by your doctor. °a. You may drive when you are no longer taking prescription pain medication, you can comfortably wear a seatbelt, and you can safely maneuver your car and apply brakes. °b. RETURN TO WORK:  __________________________________________________________ °9. You should see your doctor in the office for a follow-up appointment approximately 2-3 weeks after your surgery.  Make sure that you call for this appointment within a day or two after you arrive home to insure a convenient appointment time. °10. OTHER INSTRUCTIONS: __________________________________________________________________________________________________________________________ __________________________________________________________________________________________________________________________ °WHEN TO CALL YOUR DOCTOR: °1. Fever over 101.0 °2. Inability to urinate °3. Continued bleeding from incision. °4. Increased pain, redness, or drainage from the incision. °5. Increasing abdominal pain ° °The clinic staff is available to answer your questions during regular business hours.  Please don’t hesitate to call and ask to speak to one of the nurses for clinical concerns.  If you have a medical emergency, go to the nearest emergency room or call 911.  A surgeon from [facility] is always on call at the hospital. °[address], [address], [place], [place]  [address] ? [address] [address], [place], [place]   [address] °[phone] ? [phone] ? FAX [phone] °Web site:   [email or web address] ° °General Anesthesia, Adult, Care After  °Refer to this  sheet in the next few weeks. These instructions provide you with information on caring for yourself after your procedure. Your health care provider may also give you more specific instructions. Your treatment has been planned according to current medical practices, but problems sometimes occur. Call your health care provider if you have any problems or questions after your procedure.  °WHAT TO EXPECT AFTER THE PROCEDURE  °After the procedure, it is typical to experience:  °Sleepiness.  °Nausea and vomiting. °HOME CARE INSTRUCTIONS  °For the first 24 hours after general anesthesia:  °Have a responsible person with you.  °Do not drive a car. If you are alone, do not take public transportation.  °Do not drink alcohol.  °Do not take medicine that has not been prescribed by your health care provider.  °Do not sign important papers or make important decisions.  °You may resume a normal diet and activities as directed by your health care provider.  °Change bandages (dressings) as directed.  °If you have questions or problems that seem related to general anesthesia, call the hospital and ask for the anesthetist or anesthesiologist on call. °SEEK MEDICAL CARE IF:  °You have nausea and vomiting that continue the day after anesthesia.  °You develop a rash. °SEEK IMMEDIATE MEDICAL CARE IF:  °You have difficulty breathing.  °You have chest pain.  °You have any allergic problems. °Document Released: 02/04/2001 Document Revised: 07/01/2013 Document Reviewed: 05/14/2013  °Exit[employer]® Patient Information ©2014 [employer].  ° ° °

## 2014-12-14 NOTE — Op Note (Signed)
Laparoscopic Cholecystectomy with IOC Procedure Note  Indications: This patient presents with symptomatic gallbladder disease and will undergo laparoscopic cholecystectomy.The procedure has been discussed with the patient. Operative and non operative treatments have been discussed. Risks of surgery include bleeding, infection,  Common bile duct injury,  Injury to the stomach,liver, colon,small intestine, abdominal wall,  Diaphragm,  Major blood vessels,  And the need for an open procedure.  Other risks include worsening of medical problems, death,  DVT and pulmonary embolism, and cardiovascular events.   Medical options have also been discussed. The patient has been informed of Hindley term expectations of surgery and non surgical options,  The patient agrees to proceed.    Pre-operative Diagnosis: Calculus of gallbladder without mention of cholecystitis or obstruction  Post-operative Diagnosis: Same  Surgeon: Reonna Finlayson A.   Assistants: Dr Abbey Chatters MD  Anesthesia: General endotracheal anesthesia and Local anesthesia 0.25.% bupivacaine, with epinephrine  ASA Class: 2  Procedure Details  The patient was seen again in the Holding Room. The risks, benefits, complications, treatment options, and expected outcomes were discussed with the patient. The possibilities of reaction to medication, pulmonary aspiration, perforation of viscus, bleeding, recurrent infection, finding a normal gallbladder, the need for additional procedures, failure to diagnose a condition, the possible need to convert to an open procedure, and creating a complication requiring transfusion or operation were discussed with the patient. The patient and/or family concurred with the proposed plan, giving informed consent. The site of surgery properly noted/marked. The patient was taken to Operating Room, identified as Sael Furches and the procedure verified as Laparoscopic Cholecystectomy with Intraoperative Cholangiograms. A Time  Out was held and the above information confirmed.  Prior to the induction of general anesthesia, antibiotic prophylaxis was administered. General endotracheal anesthesia was then administered and tolerated well. After the induction, the abdomen was prepped in the usual sterile fashion. The patient was positioned in the supine position with the left arm comfortably tucked, along with some reverse Trendelenburg.  Local anesthetic agent was injected into the skin near the umbilicus and an incision made. The midline fascia was incised VIA A SMALL UMBILICAL HERNIA  and the Hasson technique was used to introduce a 12 mm port under direct vision. It was secured with a figure of eight Vicryl suture placed in the usual fashion. Pneumoperitoneum was then created with CO2 and tolerated well without any adverse changes in the patient's vital signs. Additional trocars were introduced under direct vision with an 11 mm trocar in the epigastrium and TWO 5 mm trocars in the right upper quadrant. All skin incisions were infiltrated with a local anesthetic agent before making the incision and placing the trocars.   The gallbladder was identified, the fundus grasped and retracted cephalad. Adhesions were lysed bluntly and with the electrocautery where indicated, taking care not to injure any adjacent organs or viscus. The infundibulum was grasped and retracted laterally, exposing the peritoneum overlying the triangle of Calot. This was then divided and exposed in a blunt fashion. The cystic duct was clearly identified and bluntly dissected circumferentially. The junctions of the gallbladder, cystic duct and common bile duct were clearly identified prior to the division of any linear structure.   An incision was made in the cystic duct and the cholangiogram catheter introduced. The catheter was secured using an endoclip. The study showed no stones and good visualization of the distal and proximal biliary tree. The catheter was  then removed.   The cystic duct was then  ligated with surgical clips  on the patient side and  clipped on the gallbladder side and divided. The cystic artery was identified, dissected free, ligated with clips and divided as well. Posterior cystic artery clipped and divided.  The gallbladder was dissected from the liver bed in retrograde fashion with the electrocautery. The gallbladder was removed. The liver bed was irrigated and inspected. Hemostasis was achieved with the electrocautery. Copious irrigation was utilized and was repeatedly aspirated until clear all particulate matter. Hemostasis was achieved with no signs  Of bleeding or bile leakage.  Pneumoperitoneum was completely reduced after viewing removal of the trocars under direct vision. The wound was thoroughly irrigated and the fascia was then closed with a figure of eight suture; the skin was then closed with 4 0 MONOCRYL  and a sterile dressing was applied of DERMABOND.   Instrument, sponge, and needle counts were correct at closure and at the conclusion of the case.   Findings:   Cholelithiasis  Estimated Blood Loss: Minimal         Drains: NONE         Total IV Fluids: 700 mL         Specimens: Gallbladder           Complications: None; patient tolerated the procedure well.         Disposition: PACU - hemodynamically stable.         Condition: stable

## 2014-12-14 NOTE — H&P (Signed)
H&P   [name] [name] (MR# [id number])      H&P Info    Author Note Status Last Update User Last Update Date/Time   Harriette Bouillon, MD Signed Harriette Bouillon, MD 10/11/2014 12:47 PM    H&P    Expand All Collapse All   [name] [name] 10/11/2014 9:38 AM Location: [facility] Patient #: [id number] DOB: 1960-12-28 Divorced / Language: [place] / Race: Black or African American Male History of Present Illness [name] Fus A. Cutler Sunday MD; 10/11/2014 12:46 PM) Patient words: umb hernia and gallstones pt has 2 month hx of intermittent TUQ abdominal pain sharp made better with sitting up and heat. Pt found to have gallstones on U/S. Pt has umbilical hernia. Pain sharp worse at night made worse with eating.       CLINICAL DATA: Right upper quadrant pain. History of hypertension.  EXAM: US ABDOMEN LIMITED - RIGHT UPPER QUADRANT  COMPARISON: CT of the abdomen and pelvis on 09/16/2014  FINDINGS: Gallbladder:  Gallbladder wall is 1.3 mm in thickness. No sonographic Murphy sign or pericholecystic fluid. Gallstone is 11.4 mm.  Common bile duct:  Diameter: 4.1 mm  Liver:  The liver appears hypoechoic raising the question of hepatic edema. No focal liver mass identified.  IMPRESSION: 1. 11.4 mm gallstone within the gallbladder. No other evidence for acute cholecystitis. 2. Hypoechoic hepatic parenchyma consistent with edema. This appearance can be seen in the setting of hepatitis.   Electronically Signed By: Rosalie Gums M.D. On: 09/17/2014 07:43               CLINICAL DATA: Right upper quadrant pain. History of hypertension.  EXAM: US ABDOMEN LIMITED - RIGHT UPPER QUADRANT  COMPARISON: CT of the abdomen and pelvis on 09/16/2014  FINDINGS: Gallbladder:  Gallbladder wall is 1.3 mm in thickness. No sonographic Murphy sign or pericholecystic fluid. Gallstone is 11.4 mm.  Common bile duct:  Diameter: 4.1 mm  Liver:  The liver appears  hypoechoic raising the question of hepatic edema. No focal liver mass identified.  IMPRESSION: 1. 11.4 mm gallstone within the gallbladder. No other evidence for acute cholecystitis. 2. Hypoechoic hepatic parenchyma consistent with edema. This appearance can be seen in the setting of hepatitis.   Electronically Signed By: Rosalie Gums M.D. On: 09/17/2014 07:43    Results for [name] [name], [name] [name] (MRN [id number]) as of 10/11/2014 10:01 Ref. Range 09/16/2014 17:09 Sodium Latest Range: 137-147 mEq/L 144 Potassium Latest Range: 3.7-5.3 mEq/L 4.1 Chloride Latest Range: 96-112 mEq/L 106 CO2 Latest Range: 19-32 mEq/L 23 BUN Latest Range: 6-23 mg/dL 10 Creatinine Latest Range: 0.50-1.35 mg/dL 4.69 Calcium Latest Range: 8.4-10.5 mg/dL 8.6 GFR calc non Af Amer Latest Range: >90 mL/min >90 GFR calc Af Amer Latest Range: >90 mL/min >90 Glucose Latest Range: 70-99 mg/dL 70 Anion gap Latest Range: 5-15 15 Alkaline Phosphatase Latest Range: 39-117 U/L 76 Albumin Latest Range: 3.5-5.2 g/dL 4.2 Lipase Latest Range: 11-59 U/L 25 AST Latest Range: 0-37 U/L 23 ALT Latest Range: 0-53 U/L 19 Total Protein Latest Range: 6.0-8.3 g/dL 7.9 Total Bilirubin Latest Range: 0.3-1.2 mg/dL 0.3 WBC Latest Range: 6.2-95.2 K/uL 4.1 RBC Latest Range: 4.22-5.81 MIL/uL 4.99 Hemoglobin Latest Range: 13.0-17.0 g/dL 84.1 HCT Latest Range: 39.0-52.0 % 42.5 MCV Latest Range: 78.0-100.0 fL 85.2 MCH Latest Range: 26.0-34.0 pg 28.1 MCHC Latest Range: 30.0-36.0 g/dL 32.4 RDW Latest Range: 11.5-15.5 % 14.5 Platelets Latest Range: 150-400 K/uL 273.  The patient is a 54 year old male   Other Problems Gilmer Mor, CMA; 10/11/2014 9:38 AM) Alcohol Abuse  Back Pain High blood pressure Kidney Stone  Past Surgical History Gilmer Mor(Sonya Bynum, CMA; 10/11/2014 9:38 AM) Knee Surgery Right.  Allergies (Sonya Bynum, CMA; 10/11/2014 9:39 AM) No Known Drug Allergies 10/11/2014  Medication History (Sonya Bynum, CMA; 10/11/2014  9:44 AM) Citalopram Hydrobromide (20MG  Tablet, Oral) Active. Cyclobenzaprine HCl (10MG  Tablet, Oral) Active. Gabapentin (300MG  Capsule, Oral) Active. Lisinopril-Hydrochlorothiazide (20-25MG  Tablet, Oral) Active. Meloxicam (7.5MG  Tablet, Oral) Active. Gabapentin (600MG  Tablet, Oral) Active.  Social History Gilmer Mor(Sonya Bynum, CMA; 10/11/2014 9:38 AM) Alcohol use Recently quit alcohol use. Caffeine use Coffee. Illicit drug use Uses monthly. Tobacco use Current every day smoker.  Family History Gilmer Mor(Sonya Bynum, CMA; 10/11/2014 9:38 AM) Alcohol Abuse Family Members In General.     Review of Systems Lamar Laundry(Sonya Bynum CMA; 10/11/2014 9:38 AM) General Present- Weight Loss. Not Present- Appetite Loss, Chills, Fatigue, Fever, Night Sweats and Weight Gain. Skin Not Present- Change in Wart/Mole, Dryness, Hives, Jaundice, New Lesions, Non-Healing Wounds, Rash and Ulcer. HEENT Not Present- Earache, Hearing Loss, Hoarseness, Nose Bleed, Oral Ulcers, Ringing in the Ears, Seasonal Allergies, Sinus Pain, Sore Throat, Visual Disturbances, Wears glasses/contact lenses and Yellow Eyes. Respiratory Not Present- Bloody sputum, Chronic Cough, Difficulty Breathing, Snoring and Wheezing. Cardiovascular Present- Shortness of Breath. Not Present- Chest Pain, Difficulty Breathing Lying Down, Leg Cramps, Palpitations, Rapid Heart Rate and Swelling of Extremities. Musculoskeletal Present- Back Pain. Not Present- Joint Pain, Joint Stiffness, Muscle Pain, Muscle Weakness and Swelling of Extremities.  Vitals (Sonya Bynum CMA; 10/11/2014 9:43 AM) 10/11/2014 9:43 AM Weight: 165 lb Height: 66in Body Surface Area: 1.87 m Body Mass Index: 26.63 kg/m Temp.: 77F(Temporal)  Pulse: 79 (Regular)  BP: 130/84 (Sitting, Left Arm, Standard)     Physical Exam (Rowen Hur A. Lynnda Wiersma MD; 10/11/2014 12:44 PM)  General Mental Status-Alert. General Appearance-Consistent with stated age. Hydration-Well  hydrated. Voice-Normal.  Head and Neck Head-normocephalic, atraumatic with no lesions or palpable masses.  Eye Eyeball - Bilateral-Extraocular movements intact. Sclera/Conjunctiva - Bilateral-No scleral icterus.  Chest and Lung Exam Chest and lung exam reveals -quiet, even and easy respiratory effort with no use of accessory muscles and on auscultation, normal breath sounds, no adventitious sounds and normal vocal resonance. Inspection Chest Wall - Normal. Back - normal.  Cardiovascular Cardiovascular examination reveals -on palpation PMI is normal in location and amplitude, no palpable S3 or S4. Normal cardiac borders., normal heart sounds, regular rate and rhythm with no murmurs, carotid auscultation reveals no bruits and normal pedal pulses bilaterally.  Abdomen Inspection Inspection of the abdomen reveals - No Hernias. Skin - Scar - no surgical scars. Palpation/Percussion Palpation and Percussion of the abdomen reveal - Soft, Non Tender, No Rebound tenderness, No Rigidity (guarding) and No hepatosplenomegaly. Auscultation Auscultation of the abdomen reveals - Bowel sounds normal. Note: small reducible umbilical hernia   Neurologic Neurologic evaluation reveals -alert and oriented x 3 with no impairment of recent or remote memory. Mental Status-Normal.  Musculoskeletal Normal Exam - Left-Upper Extremity Strength Normal and Lower Extremity Strength Normal. Normal Exam - Right-Upper Extremity Strength Normal, Lower Extremity Weakness.    Assessment & Plan (Shakeitha Umbaugh A. Adamariz Gillott MD; 10/11/2014 10:17 AM)  CHRONIC CHOLECYSTITIS WITH CALCULUS (574.10  K80.10) Impression: The patient has symptomatic cholelithiasis and umbilcal hernia, Recommend laparoscopic cholecystectomy and cholangiogram. The procedure has been discussed with the patient. Risks of laparoscopic cholecystectomy include bleeding, infection, bile duct injury, leak, death, open surgery, diarrhea,  other surgery, organ injury, blood vessel injury, DVT, and additional care.  Current Plans Pt Education - Cholecystectomy: Gallbladder Removal with Open  Surgery: laparoscopic cholecystectomy Pt Education - Dietary Approaches to Stop Hypertension (The DASH Diet): dietary approaches to stop hypertension Started Percocet 7.5-325MG , 1 (one) Tablet four times daily, as needed, #30, 10/11/2014, No Refill.

## 2014-12-14 NOTE — Anesthesia Postprocedure Evaluation (Signed)
Anesthesia Post Note  Patient: [name] [name]  Procedure(s) Performed: Procedure(s) (LRB): LAPAROSCOPIC CHOLECYSTECTOMY WITH INTRAOPERATIVE CHOLANGIOGRAM (N/A)  Anesthesia type: general  Patient location: PACU  Post pain: Pain level controlled  Post assessment: Patient's Cardiovascular Status Stable  Last Vitals:  Filed Vitals:   12/14/14 1039  BP: 182/105  Pulse: 63  Temp: 36.7 C  Resp: 20    Post vital signs: Reviewed and stable  Level of consciousness: sedated  Complications: No apparent anesthesia complications

## 2014-12-14 NOTE — Interval H&P Note (Signed)
History and Physical Interval Note:  12/14/2014 6:59 AM  [name] SproutAnthony Mcconaghy  has presented today for surgery, with the diagnosis of Chronic Cholecystitis  The various methods of treatment have been discussed with the patient and family. After consideration of risks, benefits and other options for treatment, the patient has consented to  Procedure(s): LAPAROSCOPIC CHOLECYSTECTOMY WITH INTRAOPERATIVE CHOLANGIOGRAM (N/A) as a surgical intervention .  The patient's history has been reviewed, patient examined, no change in status, stable for surgery.  I have reviewed the patient's chart and labs.  Questions were answered to the patient's satisfaction.     [name].

## 2014-12-16 ENCOUNTER — Encounter (HOSPITAL_COMMUNITY): Payer: Self-pay | Admitting: Surgery

## 2015-01-10 ENCOUNTER — Emergency Department (HOSPITAL_COMMUNITY)
Admission: EM | Admit: 2015-01-10 | Discharge: 2015-01-10 | Disposition: A | Discharge status: Home / Self Care | Payer: Medicare Other | Source: Home / Self Care

## 2015-01-10 ENCOUNTER — Emergency Department (HOSPITAL_COMMUNITY)
Admission: EM | Admit: 2015-01-10 | Discharge: 2015-01-10 | Disposition: A | Discharge status: Home / Self Care | Payer: Medicare Other

## 2015-01-10 ENCOUNTER — Encounter (HOSPITAL_COMMUNITY): Payer: Self-pay | Admitting: Emergency Medicine

## 2015-01-10 NOTE — ED Notes (Signed)
Pt with recent hernia repair x2 weeks ago, told to come to Ed for suture removal. Denies any complications, fevers or chills. Reports surgical pain only. NAD noted.

## 2015-01-10 NOTE — ED Notes (Signed)
Pt name has been called 3 times no answer. [facility]

## 2015-04-13 DIAGNOSIS — F1920 Other psychoactive substance dependence, uncomplicated: Secondary | ICD-10-CM | POA: Insufficient documentation

## 2015-04-13 DIAGNOSIS — R45851 Suicidal ideations: Secondary | ICD-10-CM

## 2015-04-13 HISTORY — DX: Suicidal ideations: R45.851

## 2015-04-15 DIAGNOSIS — F14951 Cocaine use, unspecified with cocaine-induced psychotic disorder with hallucinations: Secondary | ICD-10-CM | POA: Insufficient documentation

## 2015-04-16 DIAGNOSIS — F10229 Alcohol dependence with intoxication, unspecified: Secondary | ICD-10-CM | POA: Insufficient documentation

## 2015-09-08 DIAGNOSIS — F251 Schizoaffective disorder, depressive type: Secondary | ICD-10-CM | POA: Insufficient documentation

## 2016-05-29 ENCOUNTER — Emergency Department (HOSPITAL_BASED_OUTPATIENT_CLINIC_OR_DEPARTMENT_OTHER)
Admission: EM | Admit: 2016-05-29 | Discharge: 2016-05-29 | Disposition: A | Discharge status: Home / Self Care | Payer: Medicare Other | Attending: Emergency Medicine | Admitting: Emergency Medicine

## 2016-05-29 ENCOUNTER — Encounter (HOSPITAL_BASED_OUTPATIENT_CLINIC_OR_DEPARTMENT_OTHER): Payer: Self-pay | Admitting: *Deleted

## 2016-05-29 ENCOUNTER — Emergency Department (HOSPITAL_BASED_OUTPATIENT_CLINIC_OR_DEPARTMENT_OTHER): Payer: Medicare Other

## 2016-05-29 DIAGNOSIS — F329 Major depressive disorder, single episode, unspecified: Secondary | ICD-10-CM | POA: Insufficient documentation

## 2016-05-29 DIAGNOSIS — M1990 Unspecified osteoarthritis, unspecified site: Secondary | ICD-10-CM | POA: Insufficient documentation

## 2016-05-29 DIAGNOSIS — M25461 Effusion, right knee: Secondary | ICD-10-CM | POA: Insufficient documentation

## 2016-05-29 DIAGNOSIS — F17200 Nicotine dependence, unspecified, uncomplicated: Secondary | ICD-10-CM | POA: Diagnosis not present

## 2016-05-29 DIAGNOSIS — I10 Essential (primary) hypertension: Secondary | ICD-10-CM | POA: Diagnosis not present

## 2016-05-29 DIAGNOSIS — M25561 Pain in right knee: Secondary | ICD-10-CM | POA: Diagnosis present

## 2016-05-29 DIAGNOSIS — F209 Schizophrenia, unspecified: Secondary | ICD-10-CM | POA: Insufficient documentation

## 2016-05-29 DIAGNOSIS — R000 Tachycardia, unspecified: Secondary | ICD-10-CM | POA: Diagnosis not present

## 2016-05-29 MED ORDER — NAPROXEN 500 MG PO TABS: 500.0000 mg | ORAL_TABLET | Freq: Two times a day (BID) | ORAL | Status: DC

## 2016-05-29 MED ORDER — ORPHENADRINE CITRATE ER 100 MG PO TB12: 100.0000 mg | ORAL_TABLET | Freq: Two times a day (BID) | ORAL | Status: DC | PRN

## 2016-05-29 MED ORDER — CEPHALEXIN 500 MG PO CAPS: 500.0000 mg | ORAL_CAPSULE | Freq: Two times a day (BID) | ORAL | Status: DC

## 2016-05-29 NOTE — ED Notes (Signed)
Pain in his right knee. [facility]

## 2016-05-29 NOTE — ED Provider Notes (Signed)
CSN: [id number]     Arrival date & time 05/29/16  1412 History   First MD Initiated Contact with Patient 05/29/16 1546     Chief Complaint  Patient presents with  . Knee Pain     (Consider location/radiation/quality/duration/timing/severity/associated sxs/prior Treatment) Patient is a 55 y.o. male presenting with knee pain. The history is provided by the patient.  Knee Pain Associated symptoms: no back pain and no fever    Patient is a 55 year old male with history of right knee pain who presents with right knee swelling. Patient states that he fell off a roof as ago and has had intermittent pain and swelling ever since to his right knee. Patient states that "gathers fluid "and he has been seen intermittently to has his knee aspirated. It was last aspirated around one year ago. Patient states that his pain is at baseline, and not worse than normal. He states that it swelled up a lot more last night, but he has decreased today. Patient took ibuprofen yesterday which helped the swelling. Patient has not been using ice. [facility] and states that he feels that he has been having to stand on concrete a lot more, causing him to have more swelling in his knee. Patient denies any fevers, chest pain, shortness of breath, abdominal pain, nausea, vomiting, dysuria, penile discharge. Patient denies any new injury or trauma.   Past Medical History  Diagnosis Date  . Hypertension   . Depression   . Bipolar 1 disorder (HCC)   . Schizophrenia (HCC)   . Arthritis    Past Surgical History  Procedure Laterality Date  . No past surgeries    . Cholecystectomy N/A 12/14/2014    Procedure: LAPAROSCOPIC CHOLECYSTECTOMY WITH INTRAOPERATIVE CHOLANGIOGRAM;  Surgeon: Harriette Bouillonhomas Cornett, MD;  Location: [facility] OR;  Service: General;  Laterality: N/A;  . Hernia repair     No family history on file. Social History  Substance Use Topics  . Smoking status: Current Every Day Smoker -- 0.25  packs/day for 35 years  . Smokeless tobacco: None  . Alcohol Use: 3.6 oz/week    6 Cans of beer per week    Review of Systems  Constitutional: Negative for fever and chills.  HENT: Negative for facial swelling.   Respiratory: Negative for shortness of breath.   Cardiovascular: Negative for chest pain.  Gastrointestinal: Negative for nausea, vomiting and abdominal pain.  Genitourinary: Negative for dysuria.  Musculoskeletal: Positive for joint swelling (R knee) and arthralgias (baseline R knee pain). Negative for back pain.  Skin: Negative for rash and wound.  Psychiatric/Behavioral: The patient is not nervous/anxious.       Allergies  Review of patient's allergies indicates no known allergies.  Home Medications   Prior to Admission medications   Medication Sig Start Date End Date Taking? Authorizing Provider  citalopram (CELEXA) 20 MG tablet Take 20 mg by mouth daily.  09/09/14   Historical Provider, MD  gabapentin (NEURONTIN) 600 MG tablet Take 1,200-1,800 mg by mouth daily.     Historical Provider, MD  haloperidol (HALDOL) 5 MG tablet Take 5 mg by mouth 2 (two) times daily.    Historical Provider, MD  lisinopril-hydrochlorothiazide (PRINZIDE,ZESTORETIC) 20-25 MG per tablet Take 1 tablet by mouth daily.  09/09/14   Historical Provider, MD  meloxicam (MOBIC) 7.5 MG tablet Take 7.5 mg by mouth daily.  09/09/14   Historical Provider, MD  naproxen (NAPROSYN) 500 MG tablet Take 1 tablet (500 mg total) by mouth 2 (  two) times daily. 05/29/16   Emi Holes, PA-C  oxyCODONE-acetaminophen (ROXICET) 5-325 MG per tablet Take 1-2 tablets by mouth every 4 (four) hours as needed. 12/14/14   Harriette Bouillon, MD  traMADol (ULTRAM) 50 MG tablet Take 50 mg by mouth every 8 (eight) hours as needed (pain).  09/24/14   Historical Provider, MD   BP 134/88 mmHg  Pulse 104  Temp(Src) 98.1 F (36.7 C) (Oral)  Resp 20  Ht  (1.676 m)  Wt 71.215 kg  BMI 25.35 kg/m2  SpO2 99% Physical Exam    Constitutional: He appears well-developed and well-nourished. No distress.  HENT:  Head: Normocephalic and atraumatic.  Mouth/Throat: Oropharynx is clear and moist. No oropharyngeal exudate.  Eyes: Conjunctivae are normal. Pupils are equal, round, and reactive to light. Right eye exhibits no discharge. Left eye exhibits no discharge. No scleral icterus.  Neck: Normal range of motion. Neck supple. No thyromegaly present.  Cardiovascular: Regular rhythm, normal heart sounds and intact distal pulses.  Tachycardia present.  Exam reveals no gallop and no friction rub.   No murmur heard. Pulmonary/Chest: Effort normal and breath sounds normal. No stridor. No respiratory distress. He has no wheezes. He has no rales.  Abdominal: Soft. Bowel sounds are normal. He exhibits no distension. There is no tenderness. There is no rebound and no guarding.  Musculoskeletal: He exhibits no edema.       Right knee: He exhibits effusion. He exhibits normal range of motion, no ecchymosis, no LCL laxity, no bony tenderness and no MCL laxity.       Legs: R knee: Deformity that patient states is chronic due to accident 20 years ago; mild anterior, medial, superior tenderness; negative anterior/posterior drawer; no pain with varus or valgus stress, no laxity noted; negative McMurray's; no warmth, erythema; mild effusion in comparison to left knee  Lymphadenopathy:    He has no cervical adenopathy.  Neurological: He is alert. Coordination normal.  Skin: Skin is warm and dry. No rash noted. He is not diaphoretic. No pallor.  Psychiatric: He has a normal mood and affect.  Nursing note and vitals reviewed.   ED Course  Procedures (including critical care time) Labs Review Labs Reviewed - No data to display  Imaging Review No results found. I have personally reviewed and evaluated these images and lab results as part of my medical decision-making.   EKG Interpretation None      MDM   Patient presenting with  chronic knee pain. Small joint effusion noted on exam. X-rays of the low yield at this time due to no new injury or trauma. Patient advised to use ice and given knee sleeve. Patient discharged with Naprosyn twice a day. Patient advised to follow-up with the orthopedist he saw last year, or another one. Patient understands and agrees with plan. I discussed patient with Dr. Ethelda Chick who is in agreement with plan. Patient in satisfactory condition at discharge.  Final diagnoses:  Right knee pain        Emi Holes, PA-C 05/29/16 1713   Leta Baptist, MD 06/04/16 [phone]

## 2016-05-29 NOTE — Discharge Instructions (Signed)
Medications: Naprosyn  Treatment: Take Naprosyn twice daily for your knee pain and swelling. Wear your knee sleeve at all times except when bathing for comfort and support. Ice your knee 3-4 times daily alternating 20 minutes on, 20 minutes off.  Follow-up: Please follow-up with your orthopedic doctor or the orthopedic doctor outlined above for further evaluation and treatment. Please return to the emergency department if you develop any new or worsening symptoms.   Knee Pain Knee pain is a very common symptom and can have many causes. Knee pain often goes away when you follow your health care provider's instructions for relieving pain and discomfort at home. However, knee pain can develop into a condition that needs treatment. Some conditions may include:  Arthritis caused by wear and tear (osteoarthritis).  Arthritis caused by swelling and irritation (rheumatoid arthritis or gout).  A cyst or growth in your knee.  An infection in your knee joint.  An injury that will not heal.  Damage, swelling, or irritation of the tissues that support your knee (torn ligaments or tendinitis). If your knee pain continues, additional tests may be ordered to diagnose your condition. Tests may include X-rays or other imaging studies of your knee. You may also need to have fluid removed from your knee. Treatment for ongoing knee pain depends on the cause, but treatment may include:  Medicines to relieve pain or swelling.  Steroid injections in your knee.  Physical therapy.  Surgery. HOME CARE INSTRUCTIONS  Take medicines only as directed by your health care provider.  Rest your knee and keep it raised (elevated) while you are resting.  Do not do things that cause or worsen pain.  Avoid high-impact activities or exercises, such as running, jumping rope, or doing jumping jacks.  Apply ice to the knee area:  Put ice in a plastic bag.  Place a towel between your skin and the bag.  Leave the  ice on for 20 minutes, 2-3 times a day.  Ask your health care provider if you should wear an elastic knee support.  Keep a pillow under your knee when you sleep.  Lose weight if you are overweight. Extra weight can put pressure on your knee.  Do not use any tobacco products, including cigarettes, chewing tobacco, or electronic cigarettes. If you need help quitting, ask your health care provider. Smoking may slow the healing of any bone and joint problems that you may have. SEEK MEDICAL CARE IF:  Your knee pain continues, changes, or gets worse.  You have a fever along with knee pain.  Your knee buckles or locks up.  Your knee becomes more swollen. SEEK IMMEDIATE MEDICAL CARE IF:   Your knee joint feels hot to the touch.  You have chest pain or trouble breathing.   This information is not intended to replace advice given to you by your health care provider. Make sure you discuss any questions you have with your health care provider.   Document Released: 08/26/2007 Document Revised: 11/19/2014 Document Reviewed: 06/14/2014 Elsevier Interactive Patient Education [employer]2016 Elsevier Inc.

## 2016-05-29 NOTE — ED Notes (Signed)
CMS intact before and after. Pt tolerated well. Pt had no questions.  

## 2016-05-29 NOTE — ED Notes (Signed)
[facility] notified patient is awaiting pickup at this time.

## 2016-05-29 NOTE — ED Notes (Signed)
PA states to cancel XR at this time. Radiology notified.   PA at the bedside at this time.

## 2016-06-12 ENCOUNTER — Observation Stay (HOSPITAL_COMMUNITY)
Admission: EM | Admit: 2016-06-12 | Discharge: 2016-06-13 | Disposition: A | Discharge status: Home / Self Care | Payer: Medicare Other | Attending: Internal Medicine | Admitting: Internal Medicine

## 2016-06-12 ENCOUNTER — Encounter (HOSPITAL_COMMUNITY): Payer: Self-pay | Admitting: Emergency Medicine

## 2016-06-12 ENCOUNTER — Emergency Department (HOSPITAL_COMMUNITY): Payer: Medicare Other

## 2016-06-12 DIAGNOSIS — F209 Schizophrenia, unspecified: Secondary | ICD-10-CM | POA: Diagnosis not present

## 2016-06-12 DIAGNOSIS — I200 Unstable angina: Principal | ICD-10-CM | POA: Diagnosis present

## 2016-06-12 DIAGNOSIS — M1990 Unspecified osteoarthritis, unspecified site: Secondary | ICD-10-CM | POA: Insufficient documentation

## 2016-06-12 DIAGNOSIS — I10 Essential (primary) hypertension: Secondary | ICD-10-CM | POA: Diagnosis not present

## 2016-06-12 DIAGNOSIS — F1410 Cocaine abuse, uncomplicated: Secondary | ICD-10-CM | POA: Insufficient documentation

## 2016-06-12 DIAGNOSIS — F319 Bipolar disorder, unspecified: Secondary | ICD-10-CM | POA: Insufficient documentation

## 2016-06-12 DIAGNOSIS — R000 Tachycardia, unspecified: Secondary | ICD-10-CM | POA: Diagnosis not present

## 2016-06-12 DIAGNOSIS — R079 Chest pain, unspecified: Secondary | ICD-10-CM

## 2016-06-12 DIAGNOSIS — Z8249 Family history of ischemic heart disease and other diseases of the circulatory system: Secondary | ICD-10-CM

## 2016-06-12 DIAGNOSIS — E785 Hyperlipidemia, unspecified: Secondary | ICD-10-CM | POA: Insufficient documentation

## 2016-06-12 DIAGNOSIS — F17200 Nicotine dependence, unspecified, uncomplicated: Secondary | ICD-10-CM | POA: Diagnosis present

## 2016-06-12 DIAGNOSIS — F17210 Nicotine dependence, cigarettes, uncomplicated: Secondary | ICD-10-CM | POA: Insufficient documentation

## 2016-06-12 LAB — BASIC METABOLIC PANEL
Anion gap: 9 (ref 5–15)
BUN: 13 mg/dL (ref 6–20)
CHLORIDE: 101 mmol/L (ref 101–111)
CO2: 25 mmol/L (ref 22–32)
CREATININE: 0.97 mg/dL (ref 0.61–1.24)
Calcium: 9.6 mg/dL (ref 8.9–10.3)
GFR calc Af Amer: >60 mL/min (ref >60)
GFR calc non Af Amer: >60 mL/min (ref >60)
GLUCOSE: 107 mg/dL — AB (ref 65–99)
POTASSIUM: 3.9 mmol/L (ref 3.5–5.1)
Sodium: 135 mmol/L (ref 135–145)

## 2016-06-12 LAB — CBC
HEMATOCRIT: 40.6 % (ref 39.0–52.0)
Hemoglobin: 13.5 g/dL (ref 13.0–17.0)
MCH: 27.9 pg (ref 26.0–34.0)
MCHC: 33.3 g/dL (ref 30.0–36.0)
MCV: 83.9 fL (ref 78.0–100.0)
PLATELETS: 306 10*3/uL (ref 150–400)
RBC: 4.84 MIL/uL (ref 4.22–5.81)
RDW: 13.2 % (ref 11.5–15.5)
WBC: 6.3 10*3/uL (ref 4.0–10.5)

## 2016-06-12 LAB — ETHANOL: Alcohol, Ethyl (B): 115 mg/dL — ABNORMAL HIGH (ref <5)

## 2016-06-12 LAB — I-STAT TROPONIN, ED
Troponin i, poc: 0.01 ng/mL (ref 0.00–0.08)
Troponin i, poc: 0.01 ng/mL (ref 0.00–0.08)

## 2016-06-12 NOTE — ED Provider Notes (Signed)
[facility]-EMERGENCY DEPT Provider Note   CSN: [id number] Arrival date & time: 06/12/16  2007  First Provider Contact:  First MD Initiated Contact with Patient 06/12/16 2047        History   Chief Complaint Chief Complaint  Patient presents with  . Chest Pain    HPI [name] [name] is a 55 y.o. male.  This is a 55 year old male well-known to this emergency department who presents with chest pain that started this morning .  His left shoulder and is radiated to his left anterior chest.  He states he was discharged from [facility]  this morning for drug and alcohol abuse.  He states he was there for 30 days, but he was seen in this ED on May 29, 2016 date are inconsistent. Patient states that since his discharge this morning from rehabilitation.  He's only had one beer and he denies any drug use.  He states he's had a number of episodes of vomiting, no diarrhea.      Past Medical History:  Diagnosis Date  . Arthritis   . Bipolar 1 disorder (HCC)   . Depression   . Hypertension   . Schizophrenia (HCC)     There are no active problems to display for this patient.   Past Surgical History:  Procedure Laterality Date  . CHOLECYSTECTOMY N/A 12/14/2014   Procedure: LAPAROSCOPIC CHOLECYSTECTOMY WITH INTRAOPERATIVE CHOLANGIOGRAM;  Surgeon: Harriette Bouillon, MD;  Location: [facility] OR;  Service: General;  Laterality: N/A;  . HERNIA REPAIR    . NO PAST SURGERIES         Home Medications    Prior to Admission medications   Medication Sig Start Date End Date Taking? Authorizing Provider  citalopram (CELEXA) 20 MG tablet Take 20 mg by mouth daily.  09/09/14   Historical Provider, MD  gabapentin (NEURONTIN) 600 MG tablet Take 1,200-1,800 mg by mouth daily.     Historical Provider, MD  haloperidol (HALDOL) 5 MG tablet Take 5 mg by mouth 2 (two) times daily.    Historical Provider, MD  lisinopril-hydrochlorothiazide (PRINZIDE,ZESTORETIC) 20-25 MG per tablet Take 1 tablet by mouth daily.  09/09/14    Historical Provider, MD  meloxicam (MOBIC) 7.5 MG tablet Take 7.5 mg by mouth daily.  09/09/14   Historical Provider, MD  naproxen (NAPROSYN) 500 MG tablet Take 1 tablet (500 mg total) by mouth 2 (two) times daily. 05/29/16   Emi Holes, PA-C  oxyCODONE-acetaminophen (ROXICET) 5-325 MG per tablet Take 1-2 tablets by mouth every 4 (four) hours as needed. 12/14/14   Harriette Bouillon, MD  traMADol (ULTRAM) 50 MG tablet Take 50 mg by mouth every 8 (eight) hours as needed (pain).  09/24/14   Historical Provider, MD    Family History No family history on file.  Social History Social History  Substance Use Topics  . Smoking status: Current Every Day Smoker    Packs/day: 0.25    Years: 35.00  . Smokeless tobacco: Not on file  . Alcohol use Yes     Allergies   Review of patient's allergies indicates no known allergies.   Review of Systems Review of Systems  Constitutional: Negative for fever.  Respiratory: Negative for shortness of breath.   Cardiovascular: Positive for chest pain.  Gastrointestinal: Positive for nausea and vomiting.  All other systems reviewed and are negative.    Physical Exam Updated Vital Signs BP 117/85   Pulse 108   Temp 98 F (36.7 C) (Oral)   Resp 18  Ht  (1.676 m)   Wt 68 kg   SpO2 100%   BMI 24.21 kg/m   Physical Exam  Constitutional: He is oriented to person, place, and time. He appears well-developed and well-nourished.  HENT:  Head: Normocephalic.  Eyes: Pupils are equal, round, and reactive to light.  Neck: Normal range of motion.  Cardiovascular: Regular rhythm.  Tachycardia present.   No murmur heard. Pulmonary/Chest: Effort normal. No respiratory distress.  Abdominal: Soft. He exhibits no distension. There is no tenderness.  Musculoskeletal: Normal range of motion.  Neurological: He is alert and oriented to person, place, and time.  Skin: Skin is warm and dry.  Psychiatric: He has a normal mood and affect.  Nursing note and  vitals reviewed.    ED Treatments / Results  Labs (all labs ordered are listed, but only abnormal results are displayed) Labs Reviewed  BASIC METABOLIC PANEL - Abnormal; Notable for the following:       Result Value   Glucose, Bld 107 (*)    All other components within normal limits  CBC  ETHANOL  URINE RAPID DRUG SCREEN, HOSP PERFORMED  I-STAT TROPOININ, ED    EKG  EKG Interpretation  Date/Time:  Tuesday June 12 2016 20:11:50 EDT Ventricular Rate:  121 PR Interval:  150 QRS Duration: 98 QT Interval:  314 QTC Calculation: 445 R Axis:   72 Text Interpretation:  Sinus tachycardia Right atrial enlargement Left ventricular hypertrophy with repolarization abnormality Cannot rule out Inferior infarct , age undetermined Abnormal ECG Confirmed by RAY MD, Duwayne Heck ([id number]) on 06/12/2016 8:20:44 PM       Radiology No results found.  Procedures Procedures (including critical care time)  Medications Ordered in ED Medications - No data to display   Initial Impression / Assessment and Plan / ED Course  I have reviewed the triage vital signs and the nursing notes.  Pertinent labs & imaging results that were available during my care of the patient were reviewed by me and considered in my medical decision making (see chart for details).  Clinical Course   Will obtain ETOH and Drug screen  Heart score 4 with EKG changes negative Troponin X2 will add D Dimer to labs and IV fluid bolus  Patient is pain free at this time  D-dimer is within normal parameters.  Hospitalist to bedside, agrees with admission   Final Clinical Impressions(s) / ED Diagnoses   Final diagnoses:  None    New Prescriptions New Prescriptions   No medications on file     Earley Favor, NP 06/12/16 2143    Earley Favor, NP 06/13/16 2956    Arby Barrette, MD 06/14/16 [phone]

## 2016-06-12 NOTE — ED Notes (Signed)
Patient stated always at [facility] for detox from snorting crack cocaine. States has not used for a while.

## 2016-06-12 NOTE — ED Triage Notes (Signed)
Pt. reports intermittent central chest pain radiating to left shoulder onset this morning with mild SOB , occasional dry cough and nausea .

## 2016-06-12 NOTE — ED Notes (Signed)
Asked patient if he is able to provide a urine sample. Stated not at this time.

## 2016-06-12 NOTE — ED Notes (Signed)
States had a beer prior to arrival and recently discharge from [facility] for ETOH detox.

## 2016-06-13 ENCOUNTER — Encounter (HOSPITAL_COMMUNITY): Payer: Self-pay | Admitting: Family Medicine

## 2016-06-13 DIAGNOSIS — I10 Essential (primary) hypertension: Secondary | ICD-10-CM | POA: Diagnosis not present

## 2016-06-13 DIAGNOSIS — R079 Chest pain, unspecified: Secondary | ICD-10-CM | POA: Diagnosis not present

## 2016-06-13 DIAGNOSIS — Z720 Tobacco use: Secondary | ICD-10-CM | POA: Diagnosis not present

## 2016-06-13 DIAGNOSIS — F17200 Nicotine dependence, unspecified, uncomplicated: Secondary | ICD-10-CM | POA: Diagnosis present

## 2016-06-13 DIAGNOSIS — Z8249 Family history of ischemic heart disease and other diseases of the circulatory system: Secondary | ICD-10-CM

## 2016-06-13 DIAGNOSIS — I200 Unstable angina: Secondary | ICD-10-CM | POA: Diagnosis not present

## 2016-06-13 LAB — LIPID PANEL
CHOLESTEROL: 179 mg/dL (ref 0–200)
HDL: 47 mg/dL (ref >40)
LDL Cholesterol: 122 mg/dL — ABNORMAL HIGH (ref 0–99)
Total CHOL/HDL Ratio: 3.8 RATIO
Triglycerides: 52 mg/dL (ref <150)
VLDL: 10 mg/dL (ref 0–40)

## 2016-06-13 LAB — RAPID URINE DRUG SCREEN, HOSP PERFORMED
Amphetamines: NOT DETECTED
BARBITURATES: NOT DETECTED
Benzodiazepines: NOT DETECTED
COCAINE: NOT DETECTED
Opiates: NOT DETECTED
TETRAHYDROCANNABINOL: POSITIVE — AB

## 2016-06-13 LAB — HEMOGLOBIN A1C
HEMOGLOBIN A1C: 5.7 % — AB (ref 4.8–5.6)
MEAN PLASMA GLUCOSE: 117 mg/dL

## 2016-06-13 LAB — TROPONIN I

## 2016-06-13 LAB — D-DIMER, QUANTITATIVE (NOT AT ARMC): D DIMER QUANT: 0.45 ug{FEU}/mL (ref 0.00–0.50)

## 2016-06-13 MED ORDER — HYDROCHLOROTHIAZIDE 25 MG PO TABS
25.0000 mg | ORAL_TABLET | Freq: Every day | ORAL | Status: DC
  Administered 2016-06-13: 25 mg via ORAL
  Filled 2016-06-13: qty 1

## 2016-06-13 MED ORDER — ATORVASTATIN CALCIUM 20 MG PO TABS: 20.0000 mg | ORAL_TABLET | Freq: Every day | ORAL | Status: DC

## 2016-06-13 MED ORDER — ASPIRIN 81 MG PO TBEC: 81.0000 mg | DELAYED_RELEASE_TABLET | Freq: Every day | ORAL | 3 refills | Status: DC

## 2016-06-13 MED ORDER — ASPIRIN 81 MG PO CHEW
324.0000 mg | CHEWABLE_TABLET | Freq: Once | ORAL | Status: AC
  Administered 2016-06-13: 324 mg via ORAL
  Filled 2016-06-13: qty 4

## 2016-06-13 MED ORDER — AMLODIPINE BESYLATE 5 MG PO TABS: 5.0000 mg | ORAL_TABLET | Freq: Every day | ORAL | 3 refills | Status: DC

## 2016-06-13 MED ORDER — LISINOPRIL-HYDROCHLOROTHIAZIDE 20-25 MG PO TABS: 1.0000 | ORAL_TABLET | Freq: Every day | ORAL | Status: DC

## 2016-06-13 MED ORDER — METOPROLOL TARTRATE 25 MG PO TABS: 25.0000 mg | ORAL_TABLET | Freq: Two times a day (BID) | ORAL | 3 refills | Status: DC

## 2016-06-13 MED ORDER — MORPHINE SULFATE (PF) 4 MG/ML IV SOLN
4.0000 mg | Freq: Once | INTRAVENOUS | Status: AC
  Administered 2016-06-13: 4 mg via INTRAVENOUS
  Filled 2016-06-13: qty 1

## 2016-06-13 MED ORDER — ONDANSETRON HCL 4 MG/2ML IJ SOLN: 4.0000 mg | Freq: Four times a day (QID) | INTRAMUSCULAR | Status: DC | PRN

## 2016-06-13 MED ORDER — SODIUM CHLORIDE 0.9 % IV BOLUS (SEPSIS)
1000.0000 mL | Freq: Once | INTRAVENOUS | Status: AC
  Administered 2016-06-13: 1000 mL via INTRAVENOUS

## 2016-06-13 MED ORDER — ATORVASTATIN CALCIUM 20 MG PO TABS: 20.0000 mg | ORAL_TABLET | Freq: Every day | ORAL | 3 refills | Status: DC

## 2016-06-13 MED ORDER — GABAPENTIN 600 MG PO TABS: 600.0000 mg | ORAL_TABLET | Freq: Every day | ORAL | Status: DC

## 2016-06-13 MED ORDER — LISINOPRIL 10 MG PO TABS
20.0000 mg | ORAL_TABLET | Freq: Every day | ORAL | Status: DC
  Administered 2016-06-13: 20 mg via ORAL
  Filled 2016-06-13: qty 2

## 2016-06-13 MED ORDER — ENOXAPARIN SODIUM 40 MG/0.4ML ~~LOC~~ SOLN
40.0000 mg | SUBCUTANEOUS | Status: DC
  Filled 2016-06-13: qty 0.4

## 2016-06-13 MED ORDER — LISINOPRIL 20 MG PO TABS: 20.0000 mg | ORAL_TABLET | Freq: Every day | ORAL | 3 refills | Status: DC

## 2016-06-13 MED ORDER — METOPROLOL TARTRATE 25 MG PO TABS: 25.0000 mg | ORAL_TABLET | Freq: Two times a day (BID) | ORAL | Status: DC

## 2016-06-13 MED ORDER — GI COCKTAIL ~~LOC~~: 30.0000 mL | Freq: Four times a day (QID) | ORAL | Status: DC | PRN

## 2016-06-13 MED ORDER — ASPIRIN EC 81 MG PO TBEC: 81.0000 mg | DELAYED_RELEASE_TABLET | Freq: Every day | ORAL | Status: DC

## 2016-06-13 MED ORDER — ACETAMINOPHEN 325 MG PO TABS: 650.0000 mg | ORAL_TABLET | ORAL | Status: DC | PRN

## 2016-06-13 MED ORDER — MORPHINE SULFATE (PF) 2 MG/ML IV SOLN: 2.0000 mg | INTRAVENOUS | Status: DC | PRN

## 2016-06-13 MED ORDER — KETOROLAC TROMETHAMINE 30 MG/ML IJ SOLN
30.0000 mg | Freq: Once | INTRAMUSCULAR | Status: AC
  Administered 2016-06-13: 30 mg via INTRAVENOUS
  Filled 2016-06-13: qty 1

## 2016-06-13 MED ORDER — AMLODIPINE BESYLATE 5 MG PO TABS: 5.0000 mg | ORAL_TABLET | Freq: Every day | ORAL | Status: DC

## 2016-06-13 NOTE — Discharge Summary (Signed)
Physician Discharge Summary   Patient ID: [name] [name] MRN: [id number] DOB/AGE: 53/06/1961 53 y.o.  Admit date: 06/12/2016 Discharge date: 06/13/2016  Primary Care Physician:  Lupe Carney, MD  Discharge Diagnoses:    . Chest pain/Unstable angina . Essential hypertension . Smoking Hyperlipidemia   Consults:  Cardiology  Recommendations for Outpatient Follow-up:  1. Patient was recommended inpatient cardiac catheterization, however he refused. 2. Please repeat CBC/BMET at next visit 3. Patient started on statin, aspirin, Norvasc  DIET: Heart healthy diet    Allergies:  No Known Allergies   DISCHARGE MEDICATIONS: Current Discharge Medication List    START taking these medications   Details  amLODipine (NORVASC) 5 MG tablet Take 1 tablet (5 mg total) by mouth daily. Qty: 30 tablet, Refills: 3    aspirin EC 81 MG EC tablet Take 1 tablet (81 mg total) by mouth daily. Qty: 30 tablet, Refills: 3    atorvastatin (LIPITOR) 20 MG tablet Take 1 tablet (20 mg total) by mouth at bedtime. Qty: 30 tablet, Refills: 3    lisinopril (PRINIVIL,ZESTRIL) 20 MG tablet Take 1 tablet (20 mg total) by mouth daily. Qty: 30 tablet, Refills: 3      CONTINUE these medications which have NOT CHANGED   Details  citalopram (CELEXA) 20 MG tablet Take 20 mg by mouth daily.  Refills: 1    gabapentin (NEURONTIN) 600 MG tablet Take 1,200-1,800 mg by mouth daily.       STOP taking these medications     haloperidol (HALDOL) 2 MG tablet      lisinopril-hydrochlorothiazide (PRINZIDE,ZESTORETIC) 20-25 MG tablet          Brief H and P: For complete details please refer to admission H and P, but in brief [name] [name] a 55 y.o.malewith a past medical history significant for HTN and bipolar and cocaine abusewho presents with chest pain. The patient was in [facility] for alcohol and cocaine rehab until a day prior to the admission When he got out he smoked a cigarette and was driving when he  had sudden onset of left chest discomfort. This was "pressure" and "like a cinderblock" and radiated to the neck and left shoulder and was associated with dyspnea and cough without hemoptysis. It subsided spontaneously after about 15 minutes, but returned similarly 2-3 more times over the evening. Today, he was fine all day until the evening again, when he had onset of the same pain, spontaneously around 11:30PM and came to the ER. No cocaine or alcohol since getting out of rehab. He smokes 1PPD. He has HTN and takes a medicine for it (he thinks lisinopril, chart review suggests lisinopril-HCTZ 20-25). His mother died of MI at age 13 he thinks and his father died of MI in his mid-50s he claims. No history of diabetes or previous vascular disease. Patient was admitted for further workup. EKG showed ST depression and T-wave inversions in anterior leads   Hospital Course:  Chest pain/unstable angina: - The patient has HEART score of 5, risk factors include Hypertension, strong family history of CAD, nicotine abuse - D-dimer negative, EKG showed ST depression and T-wave inversions in anterior leads, troponin so far negative - Cardiology was consulted. Patient was strongly recommended cardiac catheterization due to his multiple risk factors, chest pain at rest and risk of unstable angina. Patient however refused to have cardiac catheterization as he wanted to catch the train tomorrow to visit his daughter in [place]. He also demanded food and refused to stay NPO for cardiac cath.  Dr Tresa Endo graciously offered cardiac first case in the morning, however patient remained adamant and stated that he did not want to change his trip to visit his daughter.  He was then started on aspirin, statin, Norvasc. Due to his history of cocaine abuse, he was not started on beta blocker. He was cleared by cardiology to be discharged home on medical therapy as patient refused all interventions.    Hypertension: -Continue home lisinopril, HCTZ was discontinued. He was started on Norvasc.   Bipolar disorder: - Denies that he is on medicines other than gabapentin. He is unsure of the dose, and are listed dose is a range. Patient was placed on 600 mg daily at bedtime.  Nicotine abuse - Patient counseled on smoking cessation, declines nicotine patch  substance abuse - No recent cocaine abuse, however UDS positive for THC. Patient was strongly recommended to quit smoking, any drugs.  Day of Discharge BP 110/76 (BP Location: Left Arm)   Pulse 86   Temp 98.3 F (36.8 C) (Oral)   Resp 18   Ht 5\' 6"  (1.676 m)   Wt 73.3 kg (161 lb 9.6 oz)   SpO2 100%   BMI 26.08 kg/m   Physical Exam: General: Alert and awake oriented x3 not in any acute distress. HEENT: anicteric sclera, pupils reactive to light and accommodation CVS: S1-S2 clear no murmur rubs or gallops Chest: clear to auscultation bilaterally, no wheezing rales or rhonchi Abdomen: soft nontender, nondistended, normal bowel sounds Extremities: no cyanosis, clubbing or edema noted bilaterally Neuro: Cranial nerves II-XII intact, no focal neurological deficits   The results of significant diagnostics from this hospitalization (including imaging, microbiology, ancillary and laboratory) are listed below for reference.    LAB RESULTS: Basic Metabolic Panel:  Recent Labs Lab 06/12/16 2019  NA 135  K 3.9  CL 101  CO2 25  GLUCOSE 107*  BUN 13  CREATININE 0.97  CALCIUM 9.6   Liver Function Tests: No results for input(s): AST, ALT, ALKPHOS, BILITOT, PROT, ALBUMIN in the last 168 hours. No results for input(s): LIPASE, AMYLASE in the last 168 hours. No results for input(s): AMMONIA in the last 168 hours. CBC:  Recent Labs Lab 06/12/16 2019  WBC 6.3  HGB 13.5  HCT 40.6  MCV 83.9  PLT 306   Cardiac Enzymes:  Recent Labs Lab 06/13/16 0730 06/13/16 1200  TROPONINI <0.03 <0.03   BNP: Invalid input(s):  POCBNP CBG: No results for input(s): GLUCAP in the last 168 hours.  Significant Diagnostic Studies:  Dg Chest 2 View  Result Date: 06/12/2016 CLINICAL DATA:  55 year old male with chest pain and shortness of breath EXAM: CHEST  2 VIEW COMPARISON:  Chest radiograph dated 02/28/2011 and 05/22/2012 FINDINGS: The heart size and mediastinal contours are within normal limits. Both lungs are clear. The visualized skeletal structures are unremarkable. IMPRESSION: No active cardiopulmonary disease. Electronically Signed   By: Elgie Collard M.D.   On: 06/12/2016 21:12    2D ECHO:   Disposition and Follow-up: Discharge Instructions    Diet - low sodium heart healthy    Complete by:  As directed   Increase activity slowly    Complete by:  As directed       DISPOSITION: 25 minutes   DISCHARGE FOLLOW-UP Follow-up Information    Lupe Carney, MD Follow up in 2 week(s).   Specialty:  Family Medicine Contact information: [address] [place] [address] [phone]  Time spent on Discharge:   Signed:   Merrell Borsuk M.D. [facility] 06/13/2016, 3:42 PM Pager: [phone]

## 2016-06-13 NOTE — H&P (Signed)
History and Physical  Patient Name: [name] [name]     KLK:[id number]    DOB: 1960/12/18    DOA: 06/12/2016 PCP: Lupe Carney, MD   Patient coming from: Home     Chief Complaint: Chest pain  HPI: [name] [name] is a 55 y.o. male with a past medical history significant for HTN and bipolar and cocaine abuse who presents with chest pain.  The patient was in [facility] for alcohol and cocaine rehab until yesterday.  When he got out he smoked a cigarette and was driving yesterday evening when he had sudden onset of left chest discomfort.  This was "pressure" and "like a cinderblock" and radiated to the neck and left shoulder and was associated with dyspnea and cough without hemoptysis.  It subsided spontaneously after about 15 minutes, but returned similarly 2-3 more times over the evening.  Today, he was fine all day until the evening again, when he had onset of the same pain, spontaneously around 11:30PM and came to the ER.  No cocaine or alcohol since getting out of rehab.  He smokes 1PPD.  He has HTN and takes a medicine for it (he thinks lisinopril, chart review suggests lisinopril-HCTZ 20-25).  His mother died of MI at age 62 he thinks and his father died of MI in his mid-50s he claims.  No history of diabetes or previous vascular disease.   ED course: -Afebrile, heart rate 70s to 90s, respirations normal, slightly hypertensive, oxygen saturation normal on room air -Initial ECG showed sinus tachycardia with ST depressions and troponin was negative 2. -Na 135, K 3.9, Cr 0.97 (baseline), WBC 6.3, Hgb 13 -He was given aspirin 325 mg and TRH were asked to evaluate for expedited stress testing given his new ECG changes and risk factors for cardiovascular disease     Review of Systems:  Pt complains of chest pressure, cough, dyspnea, heartburn, visual changes ("dots around my eyes"), foot tingling, finger tingling. Pt denies any hemoptysis, leg swelling, palpitations.  All other systems negative  except as just noted or noted in the history of present illness.  Past Medical History:  Diagnosis Date  . Arthritis   . Bipolar 1 disorder (HCC)   . Depression   . Hypertension   . Schizophrenia [facility])     Past Surgical History:  Procedure Laterality Date  . CHOLECYSTECTOMY N/A 12/14/2014   Procedure: LAPAROSCOPIC CHOLECYSTECTOMY WITH INTRAOPERATIVE CHOLANGIOGRAM;  Surgeon: Harriette Bouillon, MD;  Location: [facility] OR;  Service: General;  Laterality: N/A;  . HERNIA REPAIR    . NO PAST SURGERIES      Social History: Patient lives alone.  Patient walks unassisted.  Before rehabilitation use drinking a fifth of liquor per day. He smokes 1 pack per day. He denies recent cocaine use since rehab.  He is on disability for knee injuries after falling off a roof he states.  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  No Known Allergies  Family history: family history includes Heart attack (age of onset: 17) in his mother; Heart attack (age of onset: 18) in his father.  Prior to Admission medications   Medication Sig Start Date End Date Taking? Authorizing Provider  gabapentin (NEURONTIN) 600 MG tablet Take 1,200-1,800 mg by mouth daily.    Yes Historical Provider, MD  States that he also takes lisinopril (outside records querying his pharmacy show he takes lisinopril-HCTZ 20-25 and he confirms a picture of this tablet online) He denies taking haldol or citalopram     Physical Exam: BP  124/87   Pulse 92   Temp 98 F (36.7 C) (Oral)   Resp 13   Ht  (1.676 m)   Wt 68 kg (150 lb)   SpO2 96%   BMI 24.21 kg/m  General appearance: Well-developed, adult male, alert and in no acute distress.   Eyes: Anicteric, conjunctiva pink, lids and lashes normal.     ENT: No nasal deformity, discharge, or epistaxis.  OP moist without lesions.   Skin: Warm and dry.   Cardiac: RRR, nl S1-S2, no murmurs appreciated.  Capillary refill is brisk.  JVP normal.  No LE edema.  Radial and DP pulses 2+ and symmetric.  No  carotid bruits. Respiratory: Normal respiratory rate and rhythm.  CTAB without rales or wheezes. GI: Abdomen soft without rigidity.  No TTP. No ascites, distension.   MSK: No deformities or effusions.   Pain not reproduced with palpation of precordium.  No pain with arm movement. Neuro: Sensorium intact and responding to questions, attention normal.  Speech is fluent.  Moves all extremities equally and with normal coordination.    Psych: Behavior appropriate.  Affect blunted.  No evidence of aural or visual hallucinations or delusions.       Labs on Admission:  The metabolic panel shows normal electrolytes and renal function. The complete blood count shows no anemia, leukocytosis, or thrombocytopenia. Alcohol level is elevated. UDS shows positive THC, negative cocaine. D-dimer negative. The initial troponin is negative 2.  Radiological Exams on Admission: Personally reviewed: Dg Chest 2 View  Result Date: 06/12/2016 CLINICAL DATA:  55 year old male with chest pain and shortness of breath EXAM: CHEST  2 VIEW COMPARISON:  Chest radiograph dated 02/28/2011 and 05/22/2012 FINDINGS: The heart size and mediastinal contours are within normal limits. Both lungs are clear. The visualized skeletal structures are unremarkable. IMPRESSION: No active cardiopulmonary disease. Electronically Signed   By: Elgie Collard M.D.   On: 06/12/2016 21:12    EKG: Independently reviewed. Initial ECG shows sinus tachycardia with ST depressions. Repeat shows normal sinus rhythm with deep inferior ST depressions, early repolarization pattern in precordial leads, and probable atrial enlargement.    Assessment/Plan 1. Chest pain: This is new.  The patient has HEART score of 5. Angina is possible.  Other potential causes of chest pain (PE, dissection, pancreatitis, pneumonia/effusion, pericarditis) are doubted.  We have been asked to admit the patient for observation and etiology consultation with Cardiology  tomorrow.  -Serial troponins are ordered -Telemetry -Consult to cardiology, appreciate recommendations -Smoking cessation was recommended   2. Hypertension:  -Continue home lisinopril-HCTZ  3. Bipolar disorder:  Denies that he is on medicines other than gabapentin. He is unsure of the dose, and are listed dose is a range.  -Given uncertain dose, I will give 600 mg daily at bedtime.      DVT prophylaxis: Lovenox Diet: NPO after 4am for anticipated stress testing Code Status: Full  Family Communication: None present  Disposition Plan: Anticipate overnight observation for arrhythmia on telemetry, serial troponins and subsequent risk stratification by Cardiology.  If testing negative, home after. Consults called: None Admission status: Telemetry, OBS   Medical decision making: Patient seen at 3:18 AM on 06/13/2016.  The patient was discussed with Sabino Dick, PA-C. What exists of the patient's chart and outside records in CareEverywhere was reviewed in depth.  Clinical condition: stable.      Alberteen Sam [facility] Pager [phone]

## 2016-06-13 NOTE — ED Notes (Signed)
Dr. Danford at bedside  

## 2016-06-13 NOTE — Progress Notes (Signed)
[facility]                                                                              Patient Demographics  [name] [name], is a 55 y.o. male, DOB - 08-29-1961, ZOX:[id number]  Admit date - 06/12/2016   Admitting Physician Alberteen Sam, MD  Outpatient Primary MD for the patient is Lupe Carney, MD  Outpatient specialists:   LOS - 0  days    Chief Complaint  Patient presents with  . Chest Pain       Brief summary   [name] [name] is a 55 y.o. male with a past medical history significant for HTN and bipolar and cocaine abuse who presents with chest pain. The patient was in [facility] for alcohol and cocaine rehab until a day prior to the admission  When he got out he smoked a cigarette and was driving when he had sudden onset of left chest discomfort.  This was "pressure" and "like a cinderblock" and radiated to the neck and left shoulder and was associated with dyspnea and cough without hemoptysis.  It subsided spontaneously after about 15 minutes, but returned similarly 2-3 more times over the evening.  Today, he was fine all day until the evening again, when he had onset of the same pain, spontaneously around 11:30PM and came to the ER.  No cocaine or alcohol since getting out of rehab.  He smokes 1PPD.  He has HTN and takes a medicine for it (he thinks lisinopril, chart review suggests lisinopril-HCTZ 20-25).  His mother died of MI at age 50 he thinks and his father died of MI in his mid-50s he claims.  No history of diabetes or previous vascular disease.  Patient was admitted for further workup. EKG showed ST depression and T-wave inversions in anterior leads   Assessment & Plan     Chest pain: -  The patient has HEART score of 5, risk factors include Hypertension, strong family history of CAD, nicotine abuse - D-dimer negative, EKG showed ST depression and T-wave inversions in anterior leads, troponin so far negative - Cardiology consulted, awaiting further  recommendations.    Hypertension:  -Continue home lisinopril-HCTZ   Bipolar disorder:  - Denies that he is on medicines other than gabapentin. He is unsure of the dose, and are listed dose is a range. Patient was placed on 600 mg daily at bedtime.  Nicotine abuse - Patient counseled on smoking cessation, declines nicotine patch  substance abuse - No recent cocaine abuse, however UDS positive for THC  Code Status: FC DVT Prophylaxis:  Lovenox  Family Communication: Discussed in detail with the patient, all imaging results, lab results explained to the patient   Disposition Plan:   Time Spent in minutes   25 minutes  Procedures:  None   Consultants:   Cardiology  Antimicrobials:      Medications  Scheduled Meds: . enoxaparin (LOVENOX) injection  40 mg Subcutaneous Q24H  . gabapentin  600 mg Oral QHS  . hydrochlorothiazide  25 mg Oral Daily  . lisinopril  20 mg Oral Daily   Continuous Infusions:  PRN Meds:.acetaminophen, gi cocktail, morphine  injection, ondansetron (ZOFRAN) IV   Antibiotics   Anti-infectives    None        Subjective:   [name] [name] was seen and examined today. Currently no chest pain or shortness of breath.  Patient denies dizziness,  abdominal pain, N/V/D/C, new weakness, numbess, tingling. No acute events overnight.    Objective:   Vitals:   06/13/16 0130 06/13/16 0200 06/13/16 0230 06/13/16 0421  BP: 122/85 109/71 124/87 125/83  Pulse: 92 94 92 72  Resp: 21 17 13 18   Temp:    97.8 F (36.6 C)  TempSrc:    Oral  SpO2: 100% 97% 96% 99%  Weight:    73.3 kg (161 lb 9.6 oz)  Height:    5\' 6"  (1.676 m)    Intake/Output Summary (Last 24 hours) at 06/13/16 1026 Last data filed at 06/13/16 0250  Gross per 24 hour  Intake             1000 ml  Output              325 ml  Net              675 ml     Wt Readings from Last 3 Encounters:  06/13/16 73.3 kg (161 lb 9.6 oz)  05/29/16 71.2 kg (157 lb)  01/10/15 72.6 kg (160 lb)       Exam  General: Alert and oriented x 3, NAD  HEENT:  PERRLA, EOMI, Anicteric Sclera, mucous membranes moist.   Neck: Supple, no JVD, no masses  Cardiovascular: S1 S2 auscultated, no rubs, murmurs or gallops. Regular rate and rhythm.  Respiratory: Clear to auscultation bilaterally, no wheezing, rales or rhonchi  Gastrointestinal: Soft, nontender, nondistended, + bowel sounds  Ext: no cyanosis clubbing or edema  Neuro: AAOx3, Cr N's II- XII. Strength 5/5 upper and lower extremities bilaterally  Skin: No rashes  Psych: Normal affect and demeanor, alert and oriented x3    Data Reviewed:  I have personally reviewed following labs and imaging studies  Micro Results No results found for this or any previous visit (from the past 240 hour(s)).  Radiology Reports Dg Chest 2 View  Result Date: 06/12/2016 CLINICAL DATA:  55 year old male with chest pain and shortness of breath EXAM: CHEST  2 VIEW COMPARISON:  Chest radiograph dated 02/28/2011 and 05/22/2012 FINDINGS: The heart size and mediastinal contours are within normal limits. Both lungs are clear. The visualized skeletal structures are unremarkable. IMPRESSION: No active cardiopulmonary disease. Electronically Signed   By: Elgie Collard M.D.   On: 06/12/2016 21:12    Lab Data:  CBC:  Recent Labs Lab 06/12/16 2019  WBC 6.3  HGB 13.5  HCT 40.6  MCV 83.9  PLT 306   Basic Metabolic Panel:  Recent Labs Lab 06/12/16 2019  NA 135  K 3.9  CL 101  CO2 25  GLUCOSE 107*  BUN 13  CREATININE 0.97  CALCIUM 9.6   GFR: Estimated Creatinine Clearance: 79.5 mL/min (by C-G formula based on SCr of 0.97 mg/dL). Liver Function Tests: No results for input(s): AST, ALT, ALKPHOS, BILITOT, PROT, ALBUMIN in the last 168 hours. No results for input(s): LIPASE, AMYLASE in the last 168 hours. No results for input(s): AMMONIA in the last 168 hours. Coagulation Profile: No results for input(s): INR, PROTIME in the last 168  hours. Cardiac Enzymes:  Recent Labs Lab 06/13/16 0730  TROPONINI <0.03   BNP (last 3 results) No results for input(s): PROBNP in the last  8760 hours. HbA1C: No results for input(s): HGBA1C in the last 72 hours. CBG: No results for input(s): GLUCAP in the last 168 hours. Lipid Profile:  Recent Labs  06/13/16 0730  CHOL 179  HDL 47  LDLCALC 122*  TRIG 52  CHOLHDL 3.8   Thyroid Function Tests: No results for input(s): TSH, T4TOTAL, FREET4, T3FREE, THYROIDAB in the last 72 hours. Anemia Panel: No results for input(s): VITAMINB12, FOLATE, FERRITIN, TIBC, IRON, RETICCTPCT in the last 72 hours. Urine analysis:    Component Value Date/Time   COLORURINE YELLOW 09/16/2014 1756   APPEARANCEUR CLEAR 09/16/2014 1756   LABSPEC 1.014 09/16/2014 1756   PHURINE 5.0 09/16/2014 1756   GLUCOSEU NEGATIVE 09/16/2014 1756   HGBUR NEGATIVE 09/16/2014 1756   BILIRUBINUR NEGATIVE 09/16/2014 1756   KETONESUR NEGATIVE 09/16/2014 1756   PROTEINUR NEGATIVE 09/16/2014 1756   UROBILINOGEN 0.2 09/16/2014 1756   NITRITE NEGATIVE 09/16/2014 1756   LEUKOCYTESUR NEGATIVE 09/16/2014 1756     Eleanore Junio M.D. [facility] 06/13/2016, 10:26 AM  Pager: [phone] Between 7am to 7pm - call Pager - [phone]  After 7pm go to [email or web address] - password [email or web address]  Call night coverage person covering after 7pm

## 2016-06-13 NOTE — Progress Notes (Signed)
Pt. transferred from the ER via stretcher to room 2w 36 and walked to the bed; no c/o's pain; alert and oriented x4; skin intact.

## 2016-06-13 NOTE — Plan of Care (Signed)
Problem: Pain Managment: Goal: General experience of comfort will improve Outcome: Progressing No c/o pain at present.  Problem: Phase I Progression Outcomes Goal: Hemodynamically stable Outcome: Progressing VS stable.

## 2016-06-13 NOTE — ED Notes (Signed)
NP at bedside.

## 2016-06-13 NOTE — Consult Note (Addendum)
Cardiology Consult    Patient ID: [name] [name] MRN: [id number], DOB/AGE: [birth year]   Admit date: 06/12/2016 Date of Consult: 06/13/2016  Primary Physician: Lupe Carney, MD Reason for Consult: chest pain Primary Cardiologist: New  Requesting Provider: Dr. Isidoro Donning  Patient Profile  [name] [name] is a 55 year old male with a past medical history of bipolar disorder, depression, schizophrenia, HTN, ETOH abuse, and cocaine abuse. Presented to ED on 06/12/16 with chest pain that began while driving.   History of Present Illness  [name] [name] completed a 30 day rehab at [facility] and was driving home yesterday. He smoked 3 cigarettes back-to-back and developed left sided chest pain with radiation to his neck and left shoulder. He also felt short of breath, nauseous, and diaphoretic. This episode lasted for about 20 minutes. He had another episode of pain with the same characteristics around 11:30 PM last night, and decided to come to the ER.   He denies ever having any chest pain like this before. He has a strong family history of coronary artery disease. His mother died from an MI at the age of 21 and his father died of MI in his mid 38s. He is a current every day smoker, he is smoked for about 30 years. Last cocaine use was 30 days ago. His UDS is negative for cocaine, positive for marijuana.  EKG shows normal sinus rhythm, LVH. He does have some ST depression and T-wave inversion in his anterior leads. This is new when compared to EKG from 2015.  His troponin is negative 1.  Past Medical History   Past Medical History:  Diagnosis Date  . Arthritis   . Bipolar 1 disorder (HCC)   . Depression   . Hypertension   . Schizophrenia [facility])     Past Surgical History:  Procedure Laterality Date  . CHOLECYSTECTOMY N/A 12/14/2014   Procedure: LAPAROSCOPIC CHOLECYSTECTOMY WITH INTRAOPERATIVE CHOLANGIOGRAM;  Surgeon: Harriette Bouillon, MD;  Location: [facility] OR;  Service: General;  Laterality: N/A;  . HERNIA REPAIR      . NO PAST SURGERIES       Allergies  No Known Allergies  Inpatient Medications    . enoxaparin (LOVENOX) injection  40 mg Subcutaneous Q24H  . gabapentin  600 mg Oral QHS  . hydrochlorothiazide  25 mg Oral Daily  . lisinopril  20 mg Oral Daily    Family History    Family History  Problem Relation Age of Onset  . Heart attack Mother 65  . Heart attack Father 39    Social History    Social History   Social History  . Marital status: Single    Spouse name: N/A  . Number of children: N/A  . Years of education: N/A   Occupational History  . Not on file.   Social History Main Topics  . Smoking status: Current Every Day Smoker    Packs/day: 1.00    Years: 35.00  . Smokeless tobacco: Never Used  . Alcohol use No  . Drug use:     Types: Marijuana  . Sexual activity: Not on file   Other Topics Concern  . Not on file   Social History Narrative  . No narrative on file     Review of Systems    General:  No chills, fever, night sweats or weight changes.  Cardiovascular:  + chest pain,  No dyspnea on exertion, edema, orthopnea, palpitations, paroxysmal nocturnal dyspnea. Dermatological: No rash, lesions/masses Respiratory: No cough, dyspnea Urologic: No  hematuria, dysuria Abdominal:   No nausea, vomiting, diarrhea, bright red blood per rectum, melena, or hematemesis Neurologic:  No visual changes, wkns, changes in mental status. All other systems reviewed and are otherwise negative except as noted above.  Physical Exam    Blood pressure 125/83, pulse 72, temperature 97.8 F (36.6 C), temperature source Oral, resp. rate 18, height 5\' 6"  (1.676 m), weight 161 lb 9.6 oz (73.3 kg), SpO2 99 %.  General: Pleasant, NAD Psych: Normal affect. Neuro: Alert and oriented X 3. Moves all extremities spontaneously. HEENT: Normal  Neck: Supple without bruits or JVD. Lungs:  Resp regular and unlabored, CTA. Heart: RRR no s3, s4, or murmurs. Abdomen: Soft, non-tender,  non-distended, BS + x 4.  Extremities: No clubbing, cyanosis or edema. DP/PT/Radials 2+ and equal bilaterally.  Labs    Troponin [facility] Test)  Recent Labs  06/12/16 2312  TROPIPOC 0.01    Recent Labs  06/13/16 0730  TROPONINI <0.03   Lab Results  Component Value Date   WBC 6.3 06/12/2016   HGB 13.5 06/12/2016   HCT 40.6 06/12/2016   MCV 83.9 06/12/2016   PLT 306 06/12/2016    Recent Labs Lab 06/12/16 2019  NA 135  K 3.9  CL 101  CO2 25  BUN 13  CREATININE 0.97  CALCIUM 9.6  GLUCOSE 107*   Lab Results  Component Value Date   CHOL 179 06/13/2016   HDL 47 06/13/2016   LDLCALC 122 (H) 06/13/2016   TRIG 52 06/13/2016   Lab Results  Component Value Date   DDIMER 0.45 06/13/2016     Radiology Studies    Dg Chest 2 View  Result Date: 06/12/2016 CLINICAL DATA:  55 year old male with chest pain and shortness of breath EXAM: CHEST  2 VIEW COMPARISON:  Chest radiograph dated 02/28/2011 and 05/22/2012 FINDINGS: The heart size and mediastinal contours are within normal limits. Both lungs are clear. The visualized skeletal structures are unremarkable. IMPRESSION: No active cardiopulmonary disease. Electronically Signed   By: Elgie Collard M.D.   On: 06/12/2016 21:12    EKG & Cardiac Imaging    EKG: NSR, LVH, ST depression and T wave inversion in anterior leads    Assessment & Plan  1. Chest pain: Presents with acute onset chest pain at rest, of note occurred after smoking multiple cigarettes. He has a strong family history of MI, with mother and father both deceased from MI's. He also has a [place] history of ETOH abuse.   He would benefit from cardiac cath, however I am unclear at this point if he is agreeable to this. He is adamant that he has to leave tomorrow to catch a train to go see his daughter. He has been promising her for months. Also, he expresses frustration that he has not eaten all morning and doesn't think that not eating is "treating my body  well". He is agreeable to further work up as [place] as he can leave tomorrow.  2. HTN: BP well controlled on ACE-I. Continue same. Renal function stable.   3. HLD: LDL is 122, would add high dose statin. ASCVD risk is 15.9%.   Signed, Little Ishikawa, NP 06/13/2016, 8:58 AM Pager: [phone]   Patient seen and examined. Agree with assessment and plan.  [name] [name] is a 55 year old African-American male who has a greater than 30 year history of tobacco use, and a very strong family history for premature CAD with his mother dying from a massive MI  in her 73s and his father dying from an MI in his 37s.  He has a history of substance abuse with cocaine and marijuana and has been participating in rehabilitation.  Yesterday he developed new onset left-sided chest pain with radiation to his neck and left shoulder after he had smoked 3 consecutive cigarettes.  He developed mild diaphoresis and dyspnea.  He again had a recurrent episode of chest pain last evening at approximately 11:30 PM leading to his emergency room evaluation.  Presently he is pain-free.  His ECG reveals sinus rhythm at 89 with nondiagnostic inferolateral Q waves, and with mild ST changes inferiorly.  Troponin is negative.  He has mild hyperlipidemia with an LDL of 122.  He has a history of hypertension and has been on lisinopril 20 mg in addition to HCTZ.  He does not have edema on exam.  I had a Abreu discussion with the patient.  It is my recommendation that he undergo definitive diagnostic cardiac catheterization, particularly in light of his chest pain symptomatology and significant cardiac risk factor profile consisting of [place]-standing tobacco use, very strong history of premature CAD in both parents and hyperlipidemia.  I discussed the risks and benefits of the procedure in detail.  The patient was about to give his consent, but ultimately has decided against this since he has a reservation for 4:30 AM train tomorrow morning to go to  [place], [place] to see his daughter which he feels he must do.  He also admits that he is significantly hungry and needs to eat now and will not be able to wait any longer to do a heart catheterization later today.  I discussed with him potential risks of not being definitive with reference to evaluation for a high likelihood of CAD.  I discussed with him the importance of a definitive evaluation, particularly with his strong family history for premature CAD death.  He still is against undergoing the catheterization today.  As result, we will not schedule him for this.  I will discontinue HCTZ.  I will add metoprolol 25 mg twice a day to his Ace-I and will initiate atorvastatin for high potency statin in addition to aspirin antiplatelet therapy.   Lennette Bihari, MD, [facility] 06/13/2016 10:33 AM  Addendum:  Additional discussion with patient and Dr. Isidoro Donning. Pt had eaten lunch. He is adament to be taking train tomorrow to [place]. Therefore he does not want cath later tonight particularly if intervention is done which would negate his travel tomorrow. Discussed importance of cocaine avoidance and tobacco. Rather than dc on BB, with cocaine history add amlodipine rather than metoprolol, in addition to statin and ASA 81 mg.  Nicki Guadalajara, MD, [facility] 3:40 PM

## 2018-04-30 DIAGNOSIS — F331 Major depressive disorder, recurrent, moderate: Secondary | ICD-10-CM | POA: Insufficient documentation

## 2018-04-30 DIAGNOSIS — F1911 Other psychoactive substance abuse, in remission: Secondary | ICD-10-CM

## 2018-04-30 DIAGNOSIS — F1410 Cocaine abuse, uncomplicated: Secondary | ICD-10-CM

## 2018-04-30 DIAGNOSIS — F1210 Cannabis abuse, uncomplicated: Secondary | ICD-10-CM | POA: Insufficient documentation

## 2018-05-26 ENCOUNTER — Emergency Department (HOSPITAL_BASED_OUTPATIENT_CLINIC_OR_DEPARTMENT_OTHER)
Admission: EM | Admit: 2018-05-26 | Discharge: 2018-05-26 | Disposition: A | Discharge status: Home / Self Care | Payer: Medicare Other | Attending: Emergency Medicine | Admitting: Emergency Medicine

## 2018-05-26 ENCOUNTER — Encounter (HOSPITAL_BASED_OUTPATIENT_CLINIC_OR_DEPARTMENT_OTHER): Payer: Self-pay | Admitting: *Deleted

## 2018-05-26 ENCOUNTER — Other Ambulatory Visit: Payer: Self-pay

## 2018-05-26 DIAGNOSIS — F17200 Nicotine dependence, unspecified, uncomplicated: Secondary | ICD-10-CM | POA: Insufficient documentation

## 2018-05-26 DIAGNOSIS — I10 Essential (primary) hypertension: Secondary | ICD-10-CM | POA: Insufficient documentation

## 2018-05-26 DIAGNOSIS — R21 Rash and other nonspecific skin eruption: Secondary | ICD-10-CM | POA: Diagnosis present

## 2018-05-26 DIAGNOSIS — L270 Generalized skin eruption due to drugs and medicaments taken internally: Secondary | ICD-10-CM

## 2018-05-26 DIAGNOSIS — Z79899 Other long term (current) drug therapy: Secondary | ICD-10-CM | POA: Insufficient documentation

## 2018-05-26 DIAGNOSIS — L2389 Allergic contact dermatitis due to other agents: Secondary | ICD-10-CM | POA: Insufficient documentation

## 2018-05-26 DIAGNOSIS — Z7982 Long term (current) use of aspirin: Secondary | ICD-10-CM | POA: Insufficient documentation

## 2018-05-26 NOTE — ED Notes (Signed)
No difficulty with swallowing noted as well.

## 2018-05-26 NOTE — Discharge Instructions (Addendum)
Okay for patient to switch back to the gabapentin that works best for him 600 mg 3 times a day white tablet.  Stop the yellow tablet medication.  Return for any new or worse symptoms.

## 2018-05-26 NOTE — ED Provider Notes (Signed)
[facility] EMERGENCY DEPARTMENT Provider Note   CSN: [id number] Arrival date & time: 05/26/18  1927     History   Chief Complaint Chief Complaint  Patient presents with  . Rash    HPI [name] [name] is a 57 y.o. male.  [facility] sent in [facility].  Patient is an inpatient there.  Patient has been taking 600 mg of gabapentin for the past 10 years which is a white tablet.  Patient was switched over to 300 mg gabapentin 3 times a day and a yellow tablet.  And he immediately got red rash to his face.  With a little bit of burning and some itching.  No tongue swelling no lip swelling.  This is now been going on for several days.  Patient relates that one time in the past he was given a yellow tablet same thing happened before.  Patient sent for permission to go back on the white tablet.  Patient said he had no breathing problems.  Redness is mostly to the face and that happened before.     Past Medical History:  Diagnosis Date  . Arthritis   . Bipolar 1 disorder (HCC)   . Depression   . Hypertension   . Schizophrenia [facility](HCC)     Patient Active Problem List   Diagnosis Date Noted  . Chest pain 06/13/2016  . Essential hypertension 06/13/2016  . Smoking 06/13/2016  . Family history of heart attack   . Schizoaffective disorder, depressive type (HCC) 09/08/2015    Past Surgical History:  Procedure Laterality Date  . CHOLECYSTECTOMY N/A 12/14/2014   Procedure: LAPAROSCOPIC CHOLECYSTECTOMY WITH INTRAOPERATIVE CHOLANGIOGRAM;  Surgeon: Harriette Bouillonhomas Cornett, MD;  Location: [facility] OR;  Service: General;  Laterality: N/A;  . HERNIA REPAIR    . NO PAST SURGERIES          Home Medications    Prior to Admission medications   Medication Sig Start Date End Date Taking? Authorizing Provider  amLODipine (NORVASC) 5 MG tablet Take 1 tablet (5 mg total) by mouth daily. 06/13/16   Rai, Delene Ruffiniipudeep K, MD  aspirin EC 81 MG EC tablet Take 1 tablet (81 mg total) by mouth daily.  06/14/16   Rai, Delene Ruffiniipudeep K, MD  atorvastatin (LIPITOR) 20 MG tablet Take 1 tablet (20 mg total) by mouth at bedtime. 06/13/16   Rai, Delene Ruffiniipudeep K, MD  citalopram (CELEXA) 20 MG tablet Take 20 mg by mouth daily.  09/09/14   [provider]  gabapentin (NEURONTIN) 600 MG tablet Take 1,200-1,800 mg by mouth daily.     [provider]  lisinopril (PRINIVIL,ZESTRIL) 20 MG tablet Take 1 tablet (20 mg total) by mouth daily. 06/13/16   Cathren Harshai, Ripudeep K, MD    Family History Family History  Problem Relation Age of Onset  . Heart attack Mother 4140  . Heart attack Father 2150    Social History Social History   Tobacco Use  . Smoking status: Current Every Day Smoker    Packs/day: 1.00    Years: 35.00    Pack years: 35.00  . Smokeless tobacco: Never Used  Substance Use Topics  . Alcohol use: No  . Drug use: Yes    Types: Marijuana     Allergies   Patient has no known allergies.   Review of Systems Review of Systems  Constitutional: Negative for fever.  HENT: Negative for congestion, facial swelling and trouble swallowing.   Eyes: Negative for pain, itching and visual disturbance.  Respiratory:  Negative for shortness of breath and wheezing.   Cardiovascular: Negative for chest pain.  Gastrointestinal: Negative for abdominal pain.  Genitourinary: Negative for dysuria.  Musculoskeletal: Negative for back pain.  Skin: Positive for rash.  Neurological: Negative for headaches.  Hematological: Does not bruise/bleed easily.  Psychiatric/Behavioral: Negative for confusion.     Physical Exam Updated Vital Signs BP (!) 179/109   Pulse 60   Temp 98 F (36.7 C) (Oral)   Resp 18   Ht 1.676 m (5\' 6" )   Wt 72.6 kg (160 lb)   SpO2 100%   BMI 25.82 kg/m   Physical Exam  Constitutional: He is oriented to person, place, and time. He appears well-developed and well-nourished. No distress.  HENT:  Head: Normocephalic and atraumatic.  Mouth/Throat: Oropharynx is clear and moist.   Redness to the face no hives no lip swelling no tongue swelling no blisters no skin peeling.  Redness does blanch.  Eyes: Pupils are equal, round, and reactive to light. Conjunctivae and EOM are normal.  Neck: Normal range of motion. Neck supple.  Cardiovascular: Normal rate, regular rhythm and normal heart sounds.  Pulmonary/Chest: Effort normal and breath sounds normal. No respiratory distress.  Abdominal: Soft. Bowel sounds are normal. There is no tenderness.  Musculoskeletal: Normal range of motion.  Neurological: He is alert and oriented to person, place, and time. No cranial nerve deficit or sensory deficit. He exhibits normal muscle tone. Coordination normal.  Skin: Skin is warm. Rash noted.  Nursing note and vitals reviewed.    ED Treatments / Results  Labs (all labs ordered are listed, but only abnormal results are displayed) Labs Reviewed - No data to display  EKG None  Radiology No results found.  Procedures Procedures (including critical care time)  Medications Ordered in ED Medications - No data to display   Initial Impression / Assessment and Plan / ED Course  I have reviewed the triage vital signs and the nursing notes.  Pertinent labs & imaging results that were available during my care of the patient were reviewed by me and considered in my medical decision making (see chart for details).    Patient with previous similar reaction to the yellow tablet gabapentin.  Patient states for 10 years has been on the white tablet without any problems.  Permission given for him to go back to the yellow tablet.  Not certain it is a distinct allergic reaction could just be a drug reaction.  Patient without any lip swelling tongue swelling no wheezing.  Nontoxic no acute distress.   Final Clinical Impressions(s) / ED Diagnoses   Final diagnoses:  Allergic drug rash    ED Discharge Orders    None       Vanetta Mulders, MD 05/26/18 2314

## 2018-05-26 NOTE — ED Triage Notes (Signed)
He has been taking Gabapentin for years. [facility] and his Gabapentin was changed to a new type. The new tablet makes his face red.

## 2018-05-26 NOTE — ED Notes (Signed)
Pt upset about the [place] wait to see the provider, pt oriented that it should not take [place] for EDP to see him to please wait a littler longer so he can be evaluated, pt verbalized understanding cool beverage provided to the pt.

## 2018-05-26 NOTE — ED Notes (Signed)
Denies any other issues with facial redness, denies any SOB or dyspnea

## 2018-05-26 NOTE — ED Notes (Signed)
Pt states went to another facility , states has been taking 600mg  Gabapentin for the past 10 years. States has been given a different dose, then this causes flushing feeling and redness allover the face. Denies any rash or redness at any other body parts. Pt states he applies cold water to face to aid in relief of this issue.

## 2018-06-17 ENCOUNTER — Emergency Department (HOSPITAL_COMMUNITY)
Admission: EM | Admit: 2018-06-17 | Discharge: 2018-06-17 | Disposition: A | Discharge status: Home / Self Care | Payer: Medicare Other | Attending: Emergency Medicine | Admitting: Emergency Medicine

## 2018-06-17 ENCOUNTER — Encounter (HOSPITAL_COMMUNITY): Payer: Self-pay | Admitting: Emergency Medicine

## 2018-06-17 DIAGNOSIS — F1910 Other psychoactive substance abuse, uncomplicated: Secondary | ICD-10-CM | POA: Diagnosis not present

## 2018-06-17 DIAGNOSIS — F1310 Sedative, hypnotic or anxiolytic abuse, uncomplicated: Secondary | ICD-10-CM | POA: Diagnosis not present

## 2018-06-17 DIAGNOSIS — R109 Unspecified abdominal pain: Secondary | ICD-10-CM | POA: Diagnosis present

## 2018-06-17 DIAGNOSIS — F17210 Nicotine dependence, cigarettes, uncomplicated: Secondary | ICD-10-CM | POA: Insufficient documentation

## 2018-06-17 DIAGNOSIS — Z79899 Other long term (current) drug therapy: Secondary | ICD-10-CM | POA: Diagnosis not present

## 2018-06-17 DIAGNOSIS — F1410 Cocaine abuse, uncomplicated: Secondary | ICD-10-CM | POA: Diagnosis not present

## 2018-06-17 DIAGNOSIS — F1210 Cannabis abuse, uncomplicated: Secondary | ICD-10-CM | POA: Insufficient documentation

## 2018-06-17 DIAGNOSIS — F10920 Alcohol use, unspecified with intoxication, uncomplicated: Secondary | ICD-10-CM | POA: Insufficient documentation

## 2018-06-17 DIAGNOSIS — I10 Essential (primary) hypertension: Secondary | ICD-10-CM | POA: Diagnosis not present

## 2018-06-17 LAB — COMPREHENSIVE METABOLIC PANEL
ALT: 24 U/L (ref 0–44)
AST: 25 U/L (ref 15–41)
Albumin: 4 g/dL (ref 3.5–5.0)
Alkaline Phosphatase: 93 U/L (ref 38–126)
Anion gap: 11 (ref 5–15)
BILIRUBIN TOTAL: 0.6 mg/dL (ref 0.3–1.2)
BUN: 6 mg/dL (ref 6–20)
CALCIUM: 9 mg/dL (ref 8.9–10.3)
CO2: 22 mmol/L (ref 22–32)
CREATININE: 0.89 mg/dL (ref 0.61–1.24)
Chloride: 108 mmol/L (ref 98–111)
GFR calc Af Amer: >60 mL/min (ref >60)
Glucose, Bld: 117 mg/dL — ABNORMAL HIGH (ref 70–99)
Potassium: 3.9 mmol/L (ref 3.5–5.1)
Sodium: 141 mmol/L (ref 135–145)
TOTAL PROTEIN: 7.3 g/dL (ref 6.5–8.1)

## 2018-06-17 LAB — RAPID URINE DRUG SCREEN, HOSP PERFORMED
Amphetamines: NOT DETECTED
BARBITURATES: NOT DETECTED
Benzodiazepines: NOT DETECTED
COCAINE: POSITIVE — AB
OPIATES: NOT DETECTED
Tetrahydrocannabinol: NOT DETECTED

## 2018-06-17 LAB — CBC
HEMATOCRIT: 41 % (ref 39.0–52.0)
Hemoglobin: 13.5 g/dL (ref 13.0–17.0)
MCH: 27.4 pg (ref 26.0–34.0)
MCHC: 32.9 g/dL (ref 30.0–36.0)
MCV: 83.3 fL (ref 78.0–100.0)
Platelets: 333 10*3/uL (ref 150–400)
RBC: 4.92 MIL/uL (ref 4.22–5.81)
RDW: 13.1 % (ref 11.5–15.5)
WBC: 6.9 10*3/uL (ref 4.0–10.5)

## 2018-06-17 LAB — ETHANOL: ALCOHOL ETHYL (B): 236 mg/dL — AB (ref <10)

## 2018-06-17 NOTE — ED Provider Notes (Signed)
[facility] EMERGENCY DEPARTMENT Provider Note   CSN: [id number] Arrival date & time: 06/17/18  1011     History   Chief Complaint Chief Complaint  Patient presents with  . Drug / Alcohol Assessment    HPI Riely Baskett is a 57 y.o. male.  =    Callen Vancuren is a 57 y.o. male with history of arthritis, depression, hypertension, schizophrenia, polysubstance abuse, presents to emergency department  with complaint of "feeling bad."  Patient states that he drink alcohol, used cocaine and heroin this morning.  He states he was not feeling well so checked in.  He requested a note that he was here upon presentation to triage.  He states he is feeling better now and would like to be discharged home.  He states he does have some abdominal pain.  He denies any fever or chills.  No nausea or vomiting.  No SI or HI.  Past Medical History:  Diagnosis Date  . Arthritis   . Bipolar 1 disorder (HCC)   . Depression   . Hypertension   . Schizophrenia [facility])     Patient Active Problem List   Diagnosis Date Noted  . Chest pain 06/13/2016  . Essential hypertension 06/13/2016  . Smoking 06/13/2016  . Family history of heart attack   . Schizoaffective disorder, depressive type (HCC) 09/08/2015    Past Surgical History:  Procedure Laterality Date  . CHOLECYSTECTOMY N/A 12/14/2014   Procedure: LAPAROSCOPIC CHOLECYSTECTOMY WITH INTRAOPERATIVE CHOLANGIOGRAM;  Surgeon: Harriette Bouillon, MD;  Location: [facility] OR;  Service: General;  Laterality: N/A;  . HERNIA REPAIR    . NO PAST SURGERIES          Home Medications    Prior to Admission medications   Medication Sig Start Date End Date Taking? Authorizing Provider  FLUoxetine (PROZAC) 10 MG capsule Take 10 mg by mouth daily. 05/02/18  Yes [provider]  gabapentin (NEURONTIN) 600 MG tablet Take 600 mg by mouth 3 (three) times daily.    Yes [provider]  lisinopril-hydrochlorothiazide (PRINZIDE,ZESTORETIC)  20-25 MG tablet Take 1 tablet by mouth daily.   Yes [provider]  QUEtiapine (SEROQUEL) 100 MG tablet Take 100 mg by mouth daily. 05/02/18  Yes [provider]  amLODipine (NORVASC) 5 MG tablet Take 1 tablet (5 mg total) by mouth daily. Patient not taking: Reported on 06/17/2018 06/13/16   Cathren Harsh, MD  aspirin EC 81 MG EC tablet Take 1 tablet (81 mg total) by mouth daily. Patient not taking: Reported on 06/17/2018 06/14/16   Rai, Delene Ruffini, MD  atorvastatin (LIPITOR) 20 MG tablet Take 1 tablet (20 mg total) by mouth at bedtime. Patient not taking: Reported on 06/17/2018 06/13/16   Rai, Delene Ruffini, MD  lisinopril (PRINIVIL,ZESTRIL) 20 MG tablet Take 1 tablet (20 mg total) by mouth daily. Patient not taking: Reported on 06/17/2018 06/13/16   Cathren Harsh, MD    Family History Family History  Problem Relation Age of Onset  . Heart attack Mother 72  . Heart attack Father 49    Social History Social History   Tobacco Use  . Smoking status: Current Every Day Smoker    Packs/day: 1.00    Years: 35.00    Pack years: 35.00  . Smokeless tobacco: Never Used  Substance Use Topics  . Alcohol use: No  . Drug use: Yes    Types: Marijuana     Allergies   Patient has no known allergies.  Review of Systems Review of Systems  Constitutional: Negative for chills and fever.  Respiratory: Negative for cough, chest tightness and shortness of breath.   Cardiovascular: Negative for chest pain, palpitations and leg swelling.  Gastrointestinal: Positive for abdominal pain. Negative for abdominal distention, diarrhea, nausea and vomiting.  Genitourinary: Negative for dysuria, frequency, hematuria and urgency.  Musculoskeletal: Negative for arthralgias, myalgias, neck pain and neck stiffness.  Skin: Negative for rash.  Allergic/Immunologic: Negative for immunocompromised state.  Neurological: Negative for dizziness, weakness, light-headedness, numbness and headaches.  All other  systems reviewed and are negative.    Physical Exam Updated Vital Signs BP (!) 133/91   Pulse 87   Temp 98.4 F (36.9 C) (Oral)   Resp 14   SpO2 99%   Physical Exam  Constitutional: He appears well-developed and well-nourished. No distress.  HENT:  Head: Normocephalic and atraumatic.  Eyes: Conjunctivae are normal.  Neck: Neck supple.  Cardiovascular: Normal rate, regular rhythm and normal heart sounds.  Pulmonary/Chest: Effort normal. No respiratory distress. He has no wheezes. He has no rales.  Abdominal: Soft. Bowel sounds are normal. He exhibits no distension. There is no tenderness. There is no rebound.  Musculoskeletal: He exhibits no edema.  Neurological: He is alert.  Skin: Skin is warm and dry.  Nursing note and vitals reviewed.    ED Treatments / Results  Labs (all labs ordered are listed, but only abnormal results are displayed) Labs Reviewed  COMPREHENSIVE METABOLIC PANEL - Abnormal; Notable for the following components:      Result Value   Glucose, Bld 117 (*)    All other components within normal limits  ETHANOL - Abnormal; Notable for the following components:   Alcohol, Ethyl (B) 236 (*)    All other components within normal limits  RAPID URINE DRUG SCREEN, HOSP PERFORMED - Abnormal; Notable for the following components:   Cocaine POSITIVE (*)    All other components within normal limits  CBC    EKG None  Radiology No results found.  Procedures Procedures (including critical care time)  Medications Ordered in ED Medications - No data to display   Initial Impression / Assessment and Plan / ED Course  I have reviewed the triage vital signs and the nursing notes.  Pertinent labs & imaging results that were available during my care of the patient were reviewed by me and considered in my medical decision making (see chart for details).     Pt in ED after of polysubstance use this morning.  He is currently in no acute distress.  Will make  sure you drink prior to discharge.  Labs show alcohol of 236.  Otherwise unremarkable labs.  Patient monitored for few hours.  He was able to eat a sandwich and drink fluids.  He is ambulatory.  He is clinically sober at this time.  He is stable for discharge home.  Will give resources to find help for his polysubstance abuse.  Vitals:   06/17/18 1139 06/17/18 1145 06/17/18 1232 06/17/18 1233  BP: 133/89 (!) 133/91 109/86 109/86  Pulse: 88 87 87 89  Resp:  14  18  Temp:      TempSrc:      SpO2:  99%  97%     Final Clinical Impressions(s) / ED Diagnoses   Final diagnoses:  Polysubstance abuse (HCC)  Alcoholic intoxication without complication [facility](HCC)    ED Discharge Orders    None       Jaynie CrumbleKirichenko, Jarelyn Bambach, PA-C 06/17/18 1551  Tegeler, Canary Brim, MD 06/17/18 1640

## 2018-06-17 NOTE — ED Notes (Signed)
Pt ambulatory to RR w steady gait

## 2018-06-17 NOTE — ED Notes (Signed)
Went over discharge paperwork w/ pt; pt verbalized understanding

## 2018-06-17 NOTE — ED Notes (Signed)
Pt endorses heroine and crack cocaine use the morning

## 2018-06-17 NOTE — ED Triage Notes (Signed)
Pt arrives to ED with erratic behavior with ETOH on board and and cocaine just PTA per pt. Pt arrives here stating he needs methadone and a paper stating he was here. Pt denies any SI/HI thoughts. Pt reports recently his girlfriend broke up with him.

## 2018-06-17 NOTE — ED Notes (Signed)
Pt resting quietly when this RN entered room

## 2018-06-17 NOTE — Discharge Instructions (Addendum)
Please follow-up with resources provided  To get help for your polysubstance abuse

## 2018-07-11 ENCOUNTER — Emergency Department (HOSPITAL_COMMUNITY): Payer: Medicare Other

## 2018-07-11 ENCOUNTER — Emergency Department (HOSPITAL_COMMUNITY)
Admission: EM | Admit: 2018-07-11 | Discharge: 2018-07-12 | Disposition: A | Discharge status: Home / Self Care | Payer: Medicare Other | Attending: Emergency Medicine | Admitting: Emergency Medicine

## 2018-07-11 ENCOUNTER — Encounter (HOSPITAL_COMMUNITY): Payer: Self-pay | Admitting: *Deleted

## 2018-07-11 DIAGNOSIS — Z79899 Other long term (current) drug therapy: Secondary | ICD-10-CM | POA: Diagnosis not present

## 2018-07-11 DIAGNOSIS — Y999 Unspecified external cause status: Secondary | ICD-10-CM | POA: Insufficient documentation

## 2018-07-11 DIAGNOSIS — Y939 Activity, unspecified: Secondary | ICD-10-CM | POA: Diagnosis not present

## 2018-07-11 DIAGNOSIS — Y92521 Bus station as the place of occurrence of the external cause: Secondary | ICD-10-CM | POA: Insufficient documentation

## 2018-07-11 DIAGNOSIS — R1030 Lower abdominal pain, unspecified: Secondary | ICD-10-CM | POA: Insufficient documentation

## 2018-07-11 DIAGNOSIS — F10120 Alcohol abuse with intoxication, uncomplicated: Secondary | ICD-10-CM | POA: Diagnosis not present

## 2018-07-11 DIAGNOSIS — R51 Headache: Secondary | ICD-10-CM | POA: Insufficient documentation

## 2018-07-11 DIAGNOSIS — I10 Essential (primary) hypertension: Secondary | ICD-10-CM | POA: Diagnosis not present

## 2018-07-11 DIAGNOSIS — Y908 Blood alcohol level of 240 mg/100 ml or more: Secondary | ICD-10-CM | POA: Insufficient documentation

## 2018-07-11 DIAGNOSIS — S0083XA Contusion of other part of head, initial encounter: Secondary | ICD-10-CM

## 2018-07-11 DIAGNOSIS — F10920 Alcohol use, unspecified with intoxication, uncomplicated: Secondary | ICD-10-CM

## 2018-07-11 DIAGNOSIS — Z7982 Long term (current) use of aspirin: Secondary | ICD-10-CM | POA: Diagnosis not present

## 2018-07-11 DIAGNOSIS — F17210 Nicotine dependence, cigarettes, uncomplicated: Secondary | ICD-10-CM | POA: Insufficient documentation

## 2018-07-11 LAB — URINALYSIS, ROUTINE W REFLEX MICROSCOPIC
Bilirubin Urine: NEGATIVE
Glucose, UA: NEGATIVE mg/dL
Hgb urine dipstick: NEGATIVE
Ketones, ur: NEGATIVE mg/dL
Leukocytes, UA: NEGATIVE
Nitrite: NEGATIVE
Protein, ur: NEGATIVE mg/dL
SPECIFIC GRAVITY, URINE: 1.002 — AB (ref 1.005–1.030)
pH: 6 (ref 5.0–8.0)

## 2018-07-11 LAB — CBC WITH DIFFERENTIAL/PLATELET
BASOS PCT: 0 %
Basophils Absolute: 0 10*3/uL (ref 0.0–0.1)
EOS ABS: 0.1 10*3/uL (ref 0.0–0.7)
EOS PCT: 2 %
HEMATOCRIT: 40.8 % (ref 39.0–52.0)
Hemoglobin: 13.7 g/dL (ref 13.0–17.0)
Lymphocytes Relative: 37 %
Lymphs Abs: 1.5 10*3/uL (ref 0.7–4.0)
MCH: 27.6 pg (ref 26.0–34.0)
MCHC: 33.6 g/dL (ref 30.0–36.0)
MCV: 82.1 fL (ref 78.0–100.0)
MONOS PCT: 8 %
Monocytes Absolute: 0.3 10*3/uL (ref 0.1–1.0)
Neutro Abs: 2.1 10*3/uL (ref 1.7–7.7)
Neutrophils Relative %: 53 %
Platelets: 270 10*3/uL (ref 150–400)
RBC: 4.97 MIL/uL (ref 4.22–5.81)
RDW: 13.5 % (ref 11.5–15.5)
WBC: 4 10*3/uL (ref 4.0–10.5)

## 2018-07-11 LAB — RAPID URINE DRUG SCREEN, HOSP PERFORMED
AMPHETAMINES: NOT DETECTED
BENZODIAZEPINES: NOT DETECTED
Barbiturates: NOT DETECTED
Cocaine: NOT DETECTED
OPIATES: NOT DETECTED
TETRAHYDROCANNABINOL: NOT DETECTED

## 2018-07-11 LAB — I-STAT CG4 LACTIC ACID, ED: LACTIC ACID, VENOUS: 2.18 mmol/L — AB (ref 0.5–1.9)

## 2018-07-11 MED ORDER — SODIUM CHLORIDE 0.9 % IV BOLUS
1000.0000 mL | Freq: Once | INTRAVENOUS | Status: AC
  Administered 2018-07-11: 1000 mL via INTRAVENOUS
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
## 2018-07-11 NOTE — ED Notes (Signed)
This RN attempted to draw blood three times. Twice with attempts to start PIV and the three time with a butterfly needle. [facility] unable to get sufficient blood return.

## 2018-07-11 NOTE — ED Notes (Signed)
Patient refusing all x-rays and CT scans at this time. Pt states "I just want to rest a while. Please leave me alone". PA Lorelle FormosaHanna made aware. Pt brought back to room.
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
## 2018-07-11 NOTE — ED Notes (Signed)
Phlebotomy to attempt blood draw  

## 2018-07-11 NOTE — ED Notes (Signed)
I gave critical I stat CG4 result to PA Muthersbaugh H.
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
## 2018-07-12 ENCOUNTER — Emergency Department (HOSPITAL_COMMUNITY): Payer: Medicare Other

## 2018-07-12 DIAGNOSIS — S0083XA Contusion of other part of head, initial encounter: Secondary | ICD-10-CM | POA: Diagnosis not present

## 2018-07-12 LAB — COMPREHENSIVE METABOLIC PANEL
ALBUMIN: 4.3 g/dL (ref 3.5–5.0)
ALT: 17 U/L (ref 0–44)
ANION GAP: 12 (ref 5–15)
AST: 21 U/L (ref 15–41)
Alkaline Phosphatase: 78 U/L (ref 38–126)
BILIRUBIN TOTAL: 0.5 mg/dL (ref 0.3–1.2)
BUN: 8 mg/dL (ref 6–20)
CO2: 23 mmol/L (ref 22–32)
Calcium: 9.1 mg/dL (ref 8.9–10.3)
Chloride: 110 mmol/L (ref 98–111)
Creatinine, Ser: 0.79 mg/dL (ref 0.61–1.24)
GFR calc Af Amer: >60 mL/min (ref >60)
GFR calc non Af Amer: >60 mL/min (ref >60)
GLUCOSE: 110 mg/dL — AB (ref 70–99)
Potassium: 4 mmol/L (ref 3.5–5.1)
SODIUM: 145 mmol/L (ref 135–145)
TOTAL PROTEIN: 7.6 g/dL (ref 6.5–8.1)

## 2018-07-12 LAB — ETHANOL: Alcohol, Ethyl (B): 256 mg/dL — ABNORMAL HIGH (ref <10)

## 2018-07-12 LAB — I-STAT CG4 LACTIC ACID, ED: Lactic Acid, Venous: 1.38 mmol/L (ref 0.5–1.9)

## 2018-07-12 MED ORDER — IOPAMIDOL (ISOVUE-300) INJECTION 61%
INTRAVENOUS | Status: AC
  Filled 2018-07-12: qty 100

## 2018-07-12 MED ORDER — IOPAMIDOL (ISOVUE-300) INJECTION 61%: 100.0000 mL | Freq: Once | INTRAVENOUS | Status: DC | PRN

## 2018-07-12 MED ORDER — AMOXICILLIN-POT CLAVULANATE 875-125 MG PO TABS: 1.0000 | ORAL_TABLET | Freq: Two times a day (BID) | ORAL | 0 refills | Status: DC

## 2018-07-12 MED ORDER — TETANUS-DIPHTH-ACELL PERTUSSIS 5-2.5-18.5 LF-MCG/0.5 IM SUSP
0.5000 mL | Freq: Once | INTRAMUSCULAR | Status: DC
  Filled 2018-07-12: qty 0.5
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
## 2018-07-12 NOTE — Discharge Instructions (Addendum)
1. Medications: augmentin, usual home medications 2. Treatment: rest, drink plenty of fluids,  3. Follow Up: Please followup with your primary doctor in 2-3 days for discussion of your diagnoses and further evaluation after today's visit; if you do not have a primary care doctor use the resource guide provided to find one; Please return to the ER for new or worsening symptoms, vision changes, passing out, signs of infection

## 2018-07-12 NOTE — ED Notes (Signed)
Patient given water to sip on. Patient did not want anything to drink at this time.

## 2018-07-12 NOTE — ED Notes (Signed)
Pt refusing to let staff draw blood, to do his CT scans, to put BP cuff and 5 lead on. Pt is argumentative and uncooperative. "Pt stated he wants to sleep and we can get our stuff tomorrow."

## 2018-08-02 ENCOUNTER — Emergency Department (HOSPITAL_COMMUNITY): Payer: Medicare Other

## 2018-08-02 ENCOUNTER — Encounter (HOSPITAL_COMMUNITY): Payer: Self-pay | Admitting: Emergency Medicine

## 2018-08-02 ENCOUNTER — Inpatient Hospital Stay (HOSPITAL_COMMUNITY)
Admission: EM | Admit: 2018-08-02 | Discharge: 2018-08-06 | DRG: 287 | Disposition: A | Discharge status: Home / Self Care | Payer: Medicare Other | Attending: Internal Medicine | Admitting: Internal Medicine

## 2018-08-02 DIAGNOSIS — K469 Unspecified abdominal hernia without obstruction or gangrene: Secondary | ICD-10-CM | POA: Diagnosis present

## 2018-08-02 DIAGNOSIS — F251 Schizoaffective disorder, depressive type: Secondary | ICD-10-CM | POA: Diagnosis present

## 2018-08-02 DIAGNOSIS — I25110 Atherosclerotic heart disease of native coronary artery with unstable angina pectoris: Secondary | ICD-10-CM | POA: Diagnosis not present

## 2018-08-02 DIAGNOSIS — R0789 Other chest pain: Secondary | ICD-10-CM | POA: Diagnosis not present

## 2018-08-02 DIAGNOSIS — F17200 Nicotine dependence, unspecified, uncomplicated: Secondary | ICD-10-CM | POA: Diagnosis present

## 2018-08-02 DIAGNOSIS — Z8249 Family history of ischemic heart disease and other diseases of the circulatory system: Secondary | ICD-10-CM

## 2018-08-02 DIAGNOSIS — D649 Anemia, unspecified: Secondary | ICD-10-CM

## 2018-08-02 DIAGNOSIS — I420 Dilated cardiomyopathy: Secondary | ICD-10-CM | POA: Diagnosis present

## 2018-08-02 DIAGNOSIS — I200 Unstable angina: Secondary | ICD-10-CM | POA: Diagnosis present

## 2018-08-02 DIAGNOSIS — F1410 Cocaine abuse, uncomplicated: Secondary | ICD-10-CM | POA: Diagnosis present

## 2018-08-02 DIAGNOSIS — F17210 Nicotine dependence, cigarettes, uncomplicated: Secondary | ICD-10-CM | POA: Diagnosis present

## 2018-08-02 DIAGNOSIS — R079 Chest pain, unspecified: Secondary | ICD-10-CM | POA: Diagnosis present

## 2018-08-02 DIAGNOSIS — I255 Ischemic cardiomyopathy: Secondary | ICD-10-CM | POA: Diagnosis present

## 2018-08-02 DIAGNOSIS — I10 Essential (primary) hypertension: Secondary | ICD-10-CM | POA: Diagnosis present

## 2018-08-02 LAB — I-STAT TROPONIN, ED: TROPONIN I, POC: 0.01 ng/mL (ref 0.00–0.08)

## 2018-08-02 MED ORDER — IPRATROPIUM-ALBUTEROL 0.5-2.5 (3) MG/3ML IN SOLN
3.0000 mL | Freq: Once | RESPIRATORY_TRACT | Status: AC
  Administered 2018-08-03: 3 mL via RESPIRATORY_TRACT
  Filled 2018-08-02: qty 3

## 2018-08-02 NOTE — ED Triage Notes (Signed)
   Patient BIB EMS for chest pain and SOB.  Patient was walking around 2130 when he started to have tightness in his chest that went to both shoulders.  Patient states he has no cardiac hx.  Patient was given 324 ASA and 1 nitro with relief.  Pain 8/10.  Patient A&O x4.  Patient denies any drug use but states he had one beer this morning.

## 2018-08-02 NOTE — ED Provider Notes (Signed)
[facility] EMERGENCY DEPARTMENT Provider Note   CSN: [id number] Arrival date & time: 08/02/18  2308     History   Chief Complaint Chief Complaint  Patient presents with  . Chest Pain  . Shortness of Breath    HPI [name] [name] is a 57 y.o. male.  The history is provided by the patient.  He has history of hypertension and bipolar disorder and comes in because of episode of chest pain and dyspnea.  He described a tight feeling in his chest and a cough which has been nonproductive although he states he does feel like there is a metal piece stuck in his chest.  He denies fever or chills.  There has been no nausea or vomiting.  He denies diaphoresis.  EMS gave him aspirin and nitroglycerin with significant relief.  He does not recall having had similar episodes in the past.  He is a cigarette smoker.  He denies history of diabetes or hyperlipidemia.  There is a family history of premature coronary atherosclerosis.  Past Medical History:  Diagnosis Date  . Arthritis   . Bipolar 1 disorder (H[facility])   . Depression   . Hypertension   . Schizophrenia [facility])     Patient Active Problem List   Diagnosis Date Noted  . Chest pain 06/13/2016  . Essential hypertension 06/13/2016  . Smoking 06/13/2016  . Family history of heart attack   . Schizoaffective disorder, depressive type (HCC) 09/08/2015    Past Surgical History:  Procedure Laterality Date  . CHOLECYSTECTOMY N/A 12/14/2014   Procedure: LAPAROSCOPIC CHOLECYSTECTOMY WITH INTRAOPERATIVE CHOLANGIOGRAM;  Surgeon: Harriette Bouillon, MD;  Location: [facility] OR;  Service: General;  Laterality: N/A;  . HERNIA REPAIR    . NO PAST SURGERIES          Home Medications    Prior to Admission medications   Medication Sig Start Date End Date Taking? Authorizing Provider  amLODipine (NORVASC) 5 MG tablet Take 1 tablet (5 mg total) by mouth daily. Patient not taking: Reported on 06/17/2018 06/13/16   Cathren Harsh, MD    amoxicillin-clavulanate (AUGMENTIN) 875-125 MG tablet Take 1 tablet by mouth 2 (two) times daily. One po bid x 7 days 07/12/18   Muthersbaugh, Boyd Kerbs  aspirin EC 81 MG EC tablet Take 1 tablet (81 mg total) by mouth daily. Patient not taking: Reported on 06/17/2018 06/14/16   Rai, Delene Ruffini, MD  atorvastatin (LIPITOR) 20 MG tablet Take 1 tablet (20 mg total) by mouth at bedtime. Patient not taking: Reported on 06/17/2018 06/13/16   Rai, Delene Ruffini, MD  FLUoxetine (PROZAC) 10 MG capsule Take 10 mg by mouth daily. 05/02/18   [provider]  gabapentin (NEURONTIN) 600 MG tablet Take 600 mg by mouth 3 (three) times daily.     [provider]  lisinopril (PRINIVIL,ZESTRIL) 20 MG tablet Take 1 tablet (20 mg total) by mouth daily. Patient not taking: Reported on 06/17/2018 06/13/16   Rai, Delene Ruffini, MD  lisinopril-hydrochlorothiazide (PRINZIDE,ZESTORETIC) 20-25 MG tablet Take 1 tablet by mouth daily.    [provider]  QUEtiapine (SEROQUEL) 100 MG tablet Take 100 mg by mouth daily. 05/02/18   [provider]    Family History Family History  Problem Relation Age of Onset  . Heart attack Mother 88  . Heart attack Father 42    Social History Social History   Tobacco Use  . Smoking status: Current Every Day Smoker    Packs/day: 1.00  Years: 35.00    Pack years: 35.00  . Smokeless tobacco: Never Used  Substance Use Topics  . Alcohol use: No  . Drug use: Yes    Types: Marijuana     Allergies   Patient has no known allergies.   Review of Systems Review of Systems  All other systems reviewed and are negative.    Physical Exam Updated Vital Signs BP 120/74   Pulse (!) 101   Temp 98.2 F (36.8 C) (Oral)   Resp 16   SpO2 95%   Physical Exam  Nursing note and vitals reviewed.  57 year old male, resting comfortably and in no acute distress. Vital signs are significant for borderline elevated heart rate. Oxygen saturation is 95%, which is  normal. Head is normocephalic and atraumatic. PERRLA, EOMI. Oropharynx is clear. Neck is nontender and supple without adenopathy or JVD. Back is nontender and there is no CVA tenderness. Lungs have a slightly prolonged exhalation phase without any overt wheezes.  There are bibasilar rales.  There are no rhonchi. Chest is nontender. Heart has regular rate and rhythm without murmur. Abdomen is soft, flat, nontender without masses or hepatosplenomegaly and peristalsis is normoactive. Extremities have no cyanosis or edema, full range of motion is present. Skin is warm and dry without rash. Neurologic: Mental status is normal, cranial nerves are intact, there are no motor or sensory deficits.  ED Treatments / Results  Labs (all labs ordered are listed, but only abnormal results are displayed) Labs Reviewed  BASIC METABOLIC PANEL - Abnormal; Notable for the following components:      Result Value   CO2 20 (*)    All other components within normal limits  CBC - Abnormal; Notable for the following components:   Hemoglobin 11.7 (*)    HCT 36.5 (*)    All other components within normal limits  BRAIN NATRIURETIC PEPTIDE  RAPID URINE DRUG SCREEN, HOSP PERFORMED  HIV ANTIBODY (ROUTINE TESTING W REFLEX)  TROPONIN I  TROPONIN I  TROPONIN I  I-STAT TROPONIN, ED    EKG EKG Interpretation  Date/Time:  Saturday August 02 2018 23:12:03 EDT Ventricular Rate:  106 PR Interval:    QRS Duration: 98 QT Interval:  339 QTC Calculation: 451 R Axis:   78 Text Interpretation:  Sinus tachycardia Probable left atrial enlargement Left ventricular hypertrophy Borderline T abnormalities, inferior leads ST elevation, consider anterior injury When compared with ECG of 06/17/2018, No significant change was found Confirmed by Dione Booze ([id number]) on 08/02/2018 11:15:28 PM   Radiology Dg Chest 2 View  Result Date: 08/02/2018 CLINICAL DATA:  57 y/o  M; chest pain and shortness of breath today. EXAM: CHEST -  2 VIEW COMPARISON:  07/12/2018 chest radiograph. FINDINGS: Stable heart size and mediastinal contours are within normal limits. Both lungs are clear. Right upper quadrant surgical clips, presumably cholecystectomy. Mild degenerative changes of the thoracic spine. IMPRESSION: No acute pulmonary process identified. Electronically Signed   By: Mitzi Hansen M.D.   On: 08/02/2018 23:56    Procedures Procedures   Medications Ordered in ED Medications - No data to display   Initial Impression / Assessment and Plan / ED Course  I have reviewed the triage vital signs and the nursing notes.  Pertinent labs & imaging results that were available during my care of the patient were reviewed by me and considered in my medical decision making (see chart for details).  Chest discomfort and dizziness concerning for underlying coronary artery disease.  Old  records are reviewed, and he had been admitted for chest pain evaluation 2 years ago and was supposed to get a cardiac catheterization but he refused at that time.  He is noted to have history of cocaine abuse in the past.  Will need to check urine drug screen.  With rales heard, will check BNP (although chest x-ray shows no evidence of CHF).  We will also give a therapeutic trial of albuterol with ipratropium.  Heart score is 5 which puts him at moderate risk for major adverse cardiac events.  Anticipate need for hospital admission for serial troponins.  Laboratory work-up shows hemoglobin of 11.7 which is a 2 g drop.  BNP is normal.  Following albuterol with ipratropium, patient is sleeping.  Initial troponin is normal.  Case is discussed with Dr. Antionette Charpyd of [facility], who agrees to admit the patient.  Final Clinical Impressions(s) / ED Diagnoses   Final diagnoses:  Chest discomfort  Normochromic normocytic anemia    ED Discharge Orders    None       Dione BoozeGlick, Andrews Tener, MD 08/03/18 [phone]

## 2018-08-03 ENCOUNTER — Encounter (HOSPITAL_COMMUNITY): Payer: Self-pay | Admitting: Family Medicine

## 2018-08-03 ENCOUNTER — Other Ambulatory Visit: Payer: Self-pay

## 2018-08-03 ENCOUNTER — Observation Stay (HOSPITAL_BASED_OUTPATIENT_CLINIC_OR_DEPARTMENT_OTHER): Payer: Medicare Other

## 2018-08-03 DIAGNOSIS — I10 Essential (primary) hypertension: Secondary | ICD-10-CM | POA: Diagnosis not present

## 2018-08-03 DIAGNOSIS — R079 Chest pain, unspecified: Secondary | ICD-10-CM

## 2018-08-03 DIAGNOSIS — I25110 Atherosclerotic heart disease of native coronary artery with unstable angina pectoris: Secondary | ICD-10-CM | POA: Diagnosis not present

## 2018-08-03 DIAGNOSIS — F251 Schizoaffective disorder, depressive type: Secondary | ICD-10-CM | POA: Diagnosis not present

## 2018-08-03 LAB — CBC
HCT: 36.5 % — ABNORMAL LOW (ref 39.0–52.0)
Hemoglobin: 11.7 g/dL — ABNORMAL LOW (ref 13.0–17.0)
MCH: 27.2 pg (ref 26.0–34.0)
MCHC: 32.1 g/dL (ref 30.0–36.0)
MCV: 84.9 fL (ref 78.0–100.0)
PLATELETS: 277 10*3/uL (ref 150–400)
RBC: 4.3 MIL/uL (ref 4.22–5.81)
RDW: 14.1 % (ref 11.5–15.5)
WBC: 5.8 10*3/uL (ref 4.0–10.5)

## 2018-08-03 LAB — ECHOCARDIOGRAM COMPLETE
Height: 67 in
Weight: 2352 oz

## 2018-08-03 LAB — BASIC METABOLIC PANEL
Anion gap: 15 (ref 5–15)
BUN: 13 mg/dL (ref 6–20)
CALCIUM: 9.3 mg/dL (ref 8.9–10.3)
CHLORIDE: 106 mmol/L (ref 98–111)
CO2: 20 mmol/L — AB (ref 22–32)
CREATININE: 1.12 mg/dL (ref 0.61–1.24)
GFR calc non Af Amer: >60 mL/min (ref >60)
Glucose, Bld: 83 mg/dL (ref 70–99)
Potassium: 3.8 mmol/L (ref 3.5–5.1)
SODIUM: 141 mmol/L (ref 135–145)

## 2018-08-03 LAB — RAPID URINE DRUG SCREEN, HOSP PERFORMED
AMPHETAMINES: NOT DETECTED
Barbiturates: NOT DETECTED
Benzodiazepines: NOT DETECTED
Cocaine: POSITIVE — AB
OPIATES: NOT DETECTED
TETRAHYDROCANNABINOL: POSITIVE — AB

## 2018-08-03 LAB — HIV ANTIBODY (ROUTINE TESTING W REFLEX): HIV SCREEN 4TH GENERATION: NONREACTIVE

## 2018-08-03 LAB — TROPONIN I
Troponin I: <0.03 ng/mL (ref <0.03)
Troponin I: <0.03 ng/mL (ref <0.03)
Troponin I: <0.03 ng/mL (ref <0.03)

## 2018-08-03 LAB — BRAIN NATRIURETIC PEPTIDE: B Natriuretic Peptide: 24.1 pg/mL (ref 0.0–100.0)

## 2018-08-03 MED ORDER — GABAPENTIN 600 MG PO TABS
600.0000 mg | ORAL_TABLET | Freq: Three times a day (TID) | ORAL | Status: DC
  Administered 2018-08-03 – 2018-08-06 (×10): 600 mg via ORAL
  Filled 2018-08-03 (×10): qty 1

## 2018-08-03 MED ORDER — ONDANSETRON HCL 4 MG/2ML IJ SOLN: 4.0000 mg | Freq: Four times a day (QID) | INTRAMUSCULAR | Status: DC | PRN

## 2018-08-03 MED ORDER — ASPIRIN EC 81 MG PO TBEC
81.0000 mg | DELAYED_RELEASE_TABLET | Freq: Every day | ORAL | Status: DC
  Administered 2018-08-04 – 2018-08-06 (×3): 81 mg via ORAL
  Filled 2018-08-03 (×3): qty 1

## 2018-08-03 MED ORDER — ENOXAPARIN SODIUM 40 MG/0.4ML ~~LOC~~ SOLN
40.0000 mg | SUBCUTANEOUS | Status: DC
  Administered 2018-08-03 – 2018-08-05 (×3): 40 mg via SUBCUTANEOUS
  Filled 2018-08-03 (×3): qty 0.4

## 2018-08-03 MED ORDER — LISINOPRIL-HYDROCHLOROTHIAZIDE 20-25 MG PO TABS: 1.0000 | ORAL_TABLET | Freq: Every day | ORAL | Status: DC

## 2018-08-03 MED ORDER — NICOTINE 21 MG/24HR TD PT24
21.0000 mg | MEDICATED_PATCH | Freq: Every day | TRANSDERMAL | Status: DC
  Administered 2018-08-03 – 2018-08-05 (×3): 21 mg via TRANSDERMAL
  Filled 2018-08-03 (×4): qty 1

## 2018-08-03 MED ORDER — ATORVASTATIN CALCIUM 20 MG PO TABS
20.0000 mg | ORAL_TABLET | Freq: Every day | ORAL | Status: DC
  Administered 2018-08-04 – 2018-08-05 (×2): 20 mg via ORAL
  Filled 2018-08-03 (×2): qty 1

## 2018-08-03 MED ORDER — HYDROCHLOROTHIAZIDE 25 MG PO TABS
25.0000 mg | ORAL_TABLET | Freq: Every day | ORAL | Status: DC
  Administered 2018-08-03 – 2018-08-05 (×3): 25 mg via ORAL
  Filled 2018-08-03 (×3): qty 1

## 2018-08-03 MED ORDER — MORPHINE SULFATE (PF) 2 MG/ML IV SOLN
2.0000 mg | INTRAVENOUS | Status: DC | PRN
  Administered 2018-08-04 (×3): 4 mg via INTRAVENOUS
  Filled 2018-08-03 (×3): qty 2

## 2018-08-03 MED ORDER — LISINOPRIL 20 MG PO TABS
20.0000 mg | ORAL_TABLET | Freq: Every day | ORAL | Status: DC
  Administered 2018-08-03 – 2018-08-05 (×3): 20 mg via ORAL
  Filled 2018-08-03 (×3): qty 1

## 2018-08-03 MED ORDER — ASPIRIN EC 81 MG PO TBEC
81.0000 mg | DELAYED_RELEASE_TABLET | Freq: Every day | ORAL | Status: DC
  Filled 2018-08-03: qty 1

## 2018-08-03 MED ORDER — NITROGLYCERIN 0.4 MG SL SUBL: 0.4000 mg | SUBLINGUAL_TABLET | SUBLINGUAL | Status: DC | PRN

## 2018-08-03 MED ORDER — ACETAMINOPHEN 325 MG PO TABS: 650.0000 mg | ORAL_TABLET | ORAL | Status: DC | PRN

## 2018-08-03 MED ORDER — QUETIAPINE FUMARATE 50 MG PO TABS
100.0000 mg | ORAL_TABLET | Freq: Every day | ORAL | Status: DC
  Administered 2018-08-05 – 2018-08-06 (×2): 100 mg via ORAL
  Filled 2018-08-03 (×3): qty 2
  Filled 2018-08-03 (×3): qty 1

## 2018-08-03 MED ORDER — FLUOXETINE HCL 10 MG PO CAPS
10.0000 mg | ORAL_CAPSULE | Freq: Every day | ORAL | Status: DC
  Administered 2018-08-04 – 2018-08-06 (×3): 10 mg via ORAL
  Filled 2018-08-03 (×4): qty 1

## 2018-08-03 MED ORDER — QUETIAPINE FUMARATE 100 MG PO TABS
100.0000 mg | ORAL_TABLET | Freq: Every day | ORAL | Status: DC
  Filled 2018-08-03: qty 2
  Filled 2018-08-03: qty 1

## 2018-08-03 NOTE — Progress Notes (Signed)
Pt again offered his medication after eating which was his previous refusal. Still declines

## 2018-08-03 NOTE — Progress Notes (Signed)
Patient admitted early this morning with recurrent chest pain.  Symptoms are concerning in the past was offered cardiac catheterization but declined.  He was lost to follow-up.  He now presents with urine drug screen positive for cocaine and persistent intermittent chest pain which is concerning.  Cardiology will obtain an echocardiogram and Myoview is scheduled for tomorrow.

## 2018-08-03 NOTE — Progress Notes (Signed)
Pt resting quietly. Denies complaint or pain. NAD noted.
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
## 2018-08-03 NOTE — Progress Notes (Signed)
Pt states concerns about his group home spot and states "They do not know where I am. ."  Pt is unable to provide numbersand no answer at numbers found on internet. Will contact SW for assist.

## 2018-08-03 NOTE — H&P (Signed)
History and Physical    [name] [name] [id number] DOB: 09/09/1961 DOA: 08/02/2018  PCP: Clovis RileyMitchell, L.August Saucerean, MD   Patient coming from: Home   Chief Complaint: Chest pain, SOB  HPI: [name] [name] is a 57 y.o. male with medical history significant for bipolar disorder, hypertension, tobacco and illicit substance abuse, and family history of premature CAD, now presenting to the emergency department for evaluation of chest pain.  Patient reports that he was in his usual state of health until yesterday evening when he developed the acute onset of chest pain.  He was walking at the time, describes the pain as sharp, 8/10 in severity, and associated with shortness of breath.  Pain has been waxing and waning, but no alleviating or exacerbating factors are identified.  He reports associated shortness of breath, also waxing and waning, denies any significant cough, and denies fevers or chills.  Patient denies leg swelling or tenderness, personal or family history of DVT or PE, prolonged immobilization, or hemoptysis.  He has had similar symptoms and was admitted with chest pain 2 years ago, cardiology consultant recommended catheterization, that the patient refused and failed to follow-up.  He is prescribed a statin and daily baby aspirin, but has not been taking these.  He takes his ACE inhibitor sporadically.  EMS was called and the patient received 324 mg of aspirin and 1 nitroglycerin prior to arrival in the ED.  He reports no change in his symptoms with the nitroglycerin.  ED Course: Upon arrival to the ED, patient is found to be afebrile, saturating well on room air, slightly tachycardic, and with stable blood pressure.  EKG features a sinus tachycardia with rate 106, LVH, and nonspecific ST and T wave abnormality, not significantly changed from prior.  Chest x-ray is negative for acute cardiopulmonary disease.  Chemistry panel is unremarkable and CBC is notable for a mild normocytic anemia.  Initial troponin  is negative and BNP is normal.  Patient remains hemodynamically stable, in no apparent respiratory distress, and will be observed for ongoing evaluation and management of chest pain.  Review of Systems:  All other systems reviewed and apart from HPI, are negative.  Past Medical History:  Diagnosis Date  . Arthritis   . Bipolar 1 disorder (HCC)   . Depression   . Hypertension   . Schizophrenia [facility](HCC)     Past Surgical History:  Procedure Laterality Date  . CHOLECYSTECTOMY N/A 12/14/2014   Procedure: LAPAROSCOPIC CHOLECYSTECTOMY WITH INTRAOPERATIVE CHOLANGIOGRAM;  Surgeon: Harriette Bouillonhomas Cornett, MD;  Location: [facility] OR;  Service: General;  Laterality: N/A;  . HERNIA REPAIR    . NO PAST SURGERIES       reports that he has been smoking. He has a 35.00 pack-year smoking history. He has never used smokeless tobacco. He reports that he has current or past drug history. Drug: Marijuana. He reports that he does not drink alcohol.  No Known Allergies  Family History  Problem Relation Age of Onset  . Heart attack Mother 5540  . Heart attack Father 3350     Prior to Admission medications   Medication Sig Start Date End Date Taking? Authorizing Provider  FLUoxetine (PROZAC) 10 MG capsule Take 10 mg by mouth daily. 05/02/18  Yes [provider]  gabapentin (NEURONTIN) 600 MG tablet Take 600 mg by mouth 3 (three) times daily.    Yes [provider]  lisinopril-hydrochlorothiazide (PRINZIDE,ZESTORETIC) 20-25 MG tablet Take 1 tablet by mouth daily.   Yes [provider]  QUEtiapine (SEROQUEL) 100  MG tablet Take 100 mg by mouth daily. 05/02/18  Yes [provider]  amLODipine (NORVASC) 5 MG tablet Take 1 tablet (5 mg total) by mouth daily. Patient not taking: Reported on 06/17/2018 06/13/16   Cathren Harsh, MD  aspirin EC 81 MG EC tablet Take 1 tablet (81 mg total) by mouth daily. Patient not taking: Reported on 06/17/2018 06/14/16   Rai, Delene Ruffini, MD  atorvastatin (LIPITOR) 20  MG tablet Take 1 tablet (20 mg total) by mouth at bedtime. Patient not taking: Reported on 06/17/2018 06/13/16   Cathren Harsh, MD    Physical Exam: Vitals:   08/02/18 2315 08/02/18 2330 08/03/18 0015 08/03/18 0115  BP: 120/74 117/74 126/74 105/65  Pulse: (!) 101 98 99 92  Resp: 16 20 20 20   Temp:      TempSrc:      SpO2: 90% 91% 94% 97%     Constitutional: Sleeping, easily roused, NAD  Eyes: PERTLA, lids and conjunctivae normal ENMT: Mucous membranes are moist. Posterior pharynx clear of any exudate or lesions.   Neck: normal, supple, no masses, no thyromegaly Respiratory: clear to auscultation bilaterally, no wheezing, no crackles. Normal respiratory effort.    Cardiovascular: S1 & S2 heard, regular rate and rhythm. No extremity edema.  Abdomen: No distension, no tenderness, soft. Bowel sounds normal.  Musculoskeletal: no clubbing / cyanosis. No joint deformity upper and lower extremities.   Skin: no significant rashes, lesions, ulcers. Warm, dry, well-perfused. Neurologic: CN 2-12 grossly intact. Sensation intact. Strength 5/5 in all 4 limbs.  Psychiatric: Sleeping, easily roused and oriented x 3. Pleasant and cooperative.    Labs on Admission: I have personally reviewed following labs and imaging studies  CBC: Recent Labs  Lab 08/02/18 2319  WBC 5.8  HGB 11.7*  HCT 36.5*  MCV 84.9  PLT 277   Basic Metabolic Panel: Recent Labs  Lab 08/02/18 2319  NA 141  K 3.8  CL 106  CO2 20*  GLUCOSE 83  BUN 13  CREATININE 1.12  CALCIUM 9.3   GFR: CrCl cannot be calculated (Unknown ideal weight.). Liver Function Tests: No results for input(s): AST, ALT, ALKPHOS, BILITOT, PROT, ALBUMIN in the last 168 hours. No results for input(s): LIPASE, AMYLASE in the last 168 hours. No results for input(s): AMMONIA in the last 168 hours. Coagulation Profile: No results for input(s): INR, PROTIME in the last 168 hours. Cardiac Enzymes: No results for input(s): CKTOTAL, CKMB,  CKMBINDEX, TROPONINI in the last 168 hours. BNP (last 3 results) No results for input(s): PROBNP in the last 8760 hours. HbA1C: No results for input(s): HGBA1C in the last 72 hours. CBG: No results for input(s): GLUCAP in the last 168 hours. Lipid Profile: No results for input(s): CHOL, HDL, LDLCALC, TRIG, CHOLHDL, LDLDIRECT in the last 72 hours. Thyroid Function Tests: No results for input(s): TSH, T4TOTAL, FREET4, T3FREE, THYROIDAB in the last 72 hours. Anemia Panel: No results for input(s): VITAMINB12, FOLATE, FERRITIN, TIBC, IRON, RETICCTPCT in the last 72 hours. Urine analysis:    Component Value Date/Time   COLORURINE COLORLESS (A) 07/11/2018 2231   APPEARANCEUR CLEAR 07/11/2018 2231   LABSPEC 1.002 (L) 07/11/2018 2231   PHURINE 6.0 07/11/2018 2231   GLUCOSEU NEGATIVE 07/11/2018 2231   HGBUR NEGATIVE 07/11/2018 2231   BILIRUBINUR NEGATIVE 07/11/2018 2231   KETONESUR NEGATIVE 07/11/2018 2231   PROTEINUR NEGATIVE 07/11/2018 2231   UROBILINOGEN 0.2 09/16/2014 1756   NITRITE NEGATIVE 07/11/2018 2231   LEUKOCYTESUR NEGATIVE 07/11/2018 2231  Sepsis Labs: @LABRCNTIP (procalcitonin:4,lacticidven:4) )No results found for this or any previous visit (from the past 240 hour(s)).   Radiological Exams on Admission: Dg Chest 2 View  Result Date: 08/02/2018 CLINICAL DATA:  57 y/o  M; chest pain and shortness of breath today. EXAM: CHEST - 2 VIEW COMPARISON:  07/12/2018 chest radiograph. FINDINGS: Stable heart size and mediastinal contours are within normal limits. Both lungs are clear. Right upper quadrant surgical clips, presumably cholecystectomy. Mild degenerative changes of the thoracic spine. IMPRESSION: No acute pulmonary process identified. Electronically Signed   By: Mitzi Hansen M.D.   On: 08/02/2018 23:56    EKG: Independently reviewed. Sinus tachycardia (rate 106), LVH, non-specific ST and T-wave abnormalities, similar to prior.   Assessment/Plan   1. Chest  pain  - Presents with chest pain, unchanged with NTG prior to arrival  - EKG with non-specific ST & T-wave abnormalities that appear similar to prior, initial troponin is negative, and CXR unremarkable  - He was treated with ASA 324 mg by EMS prior to arrival in ED  - Risk-factors include FHx of early CAD in both parents, tobacco abuse, and HTN - Cardiology recommended cath in August 2017 but patient refused at that time and failed to follow-up  - Continue cardiac monitoring, check serial troponin measurements, repeat EKG, resume ASA and Lipitor, continue ACE, avoid beta-blocker for now pending UDS given hx of cocaine abuse   2. Hypertension  - BP at goal  - Continue lisinopril-HCTZ    3. Depression, anxiety  - Continue Seroquel and Prozac    DVT prophylaxis: Lovenox Code Status: Full  Family Communication: Discussed with patient  Consults called: None Admission status: Observation     Briscoe Deutscher, MD [facility] Pager [phone]  If 7PM-7AM, please contact night-coverage [email or web address] Password [facility]  08/03/2018, 3:03 AM

## 2018-08-03 NOTE — ED Notes (Signed)
Pt unable to urinate. Gave urinal. Pt will call out when they feel the urge to go.

## 2018-08-03 NOTE — Progress Notes (Signed)
  Echocardiogram 2D Echocardiogram has been performed.  Gerda Dissrthur L Keishla Oyer 08/03/2018, 12:41 PM

## 2018-08-03 NOTE — Progress Notes (Signed)
[employer] states they made contact with pt s. Group home and personal states they will hold spot for pt. Pt made aware and states that his group home personal have contacted him and he is much relieved. Pt eating lunch and tolerating well. Denies pain. Denies complaint

## 2018-08-03 NOTE — Progress Notes (Signed)
Pt sleeping resp even and non labored. 

## 2018-08-03 NOTE — Plan of Care (Signed)
Pt arrived on floor, settled & oriented to room and unit. Pt calm and in NAD. Progressing positively.

## 2018-08-03 NOTE — Progress Notes (Signed)
Pt sleeping with non labored resp.

## 2018-08-03 NOTE — Progress Notes (Signed)
Security contacted for assist through [employer] to make contact with Group home.

## 2018-08-03 NOTE — Progress Notes (Signed)
Gown changed for pt comfort. Pt declines bath at this time.

## 2018-08-03 NOTE — Progress Notes (Signed)
Pt rouses easily from sleep. Resp even and non labored.NAD noted. Pt denies c/o atpresent.

## 2018-08-04 ENCOUNTER — Observation Stay (HOSPITAL_COMMUNITY): Payer: Medicare Other

## 2018-08-04 DIAGNOSIS — R072 Precordial pain: Secondary | ICD-10-CM

## 2018-08-04 DIAGNOSIS — I200 Unstable angina: Secondary | ICD-10-CM | POA: Diagnosis not present

## 2018-08-04 DIAGNOSIS — R079 Chest pain, unspecified: Secondary | ICD-10-CM | POA: Diagnosis not present

## 2018-08-04 DIAGNOSIS — I420 Dilated cardiomyopathy: Secondary | ICD-10-CM

## 2018-08-04 DIAGNOSIS — I255 Ischemic cardiomyopathy: Secondary | ICD-10-CM | POA: Diagnosis present

## 2018-08-04 LAB — NM MYOCAR MULTI W/SPECT W/WALL MOTION / EF
CHL RATE OF PERCEIVED EXERTION: 0
CSEPED: 0 min
CSEPHR: 59 %
Estimated workload: 1 METS
Exercise duration (sec): 0 s
MPHR: 165 {beats}/min
Peak HR: 98 {beats}/min
Rest HR: 90 {beats}/min

## 2018-08-04 LAB — TROPONIN I

## 2018-08-04 MED ORDER — REGADENOSON 0.4 MG/5ML IV SOLN
0.4000 mg | Freq: Once | INTRAVENOUS | Status: AC
  Administered 2018-08-04: 0.4 mg via INTRAVENOUS
  Filled 2018-08-04: qty 5

## 2018-08-04 MED ORDER — NITROGLYCERIN 2 % TD OINT
1.0000 [in_us] | TOPICAL_OINTMENT | Freq: Three times a day (TID) | TRANSDERMAL | Status: DC
  Administered 2018-08-04 – 2018-08-06 (×5): 1 [in_us] via TOPICAL
  Filled 2018-08-04: qty 30
  Filled 2018-08-04 (×3): qty 1

## 2018-08-04 MED ORDER — TECHNETIUM TC 99M TETROFOSMIN IV KIT
30.0000 | PACK | Freq: Once | INTRAVENOUS | Status: AC | PRN
  Administered 2018-08-04: 30 via INTRAVENOUS

## 2018-08-04 MED ORDER — REGADENOSON 0.4 MG/5ML IV SOLN
INTRAVENOUS | Status: AC
  Filled 2018-08-04: qty 5

## 2018-08-04 MED ORDER — TECHNETIUM TC 99M TETROFOSMIN IV KIT
10.0000 | PACK | Freq: Once | INTRAVENOUS | Status: AC | PRN
  Administered 2018-08-04: 10 via INTRAVENOUS

## 2018-08-04 NOTE — Progress Notes (Signed)
Progress Note  Patient Name: [name] [name] Date of Encounter: 08/04/2018  Primary Cardiologist:   No primary care provider on file.   Subjective   No chest pain.  He does have some tenderness to palpation and movement in his abdomen.  He wonders if he "pulled something"  Inpatient Medications    Scheduled Meds: . aspirin EC  81 mg Oral Daily  . atorvastatin  20 mg Oral q1800  . enoxaparin (LOVENOX) injection  40 mg Subcutaneous Q24H  . FLUoxetine  10 mg Oral Daily  . gabapentin  600 mg Oral TID  . lisinopril  20 mg Oral Daily   And  . hydrochlorothiazide  25 mg Oral Daily  . nicotine  21 mg Transdermal Daily  . QUEtiapine  100 mg Oral Daily  . regadenoson       Continuous Infusions:  PRN Meds: acetaminophen, morphine injection, nitroGLYCERIN, ondansetron (ZOFRAN) IV   Vital Signs    Vitals:   08/04/18 1008 08/04/18 1011 08/04/18 1013 08/04/18 1016  BP: 118/83 123/87 126/80 120/73  Pulse: 60 68 87 82  Resp:      Temp:      TempSrc:      SpO2:      Weight:      Height:       No intake or output data in the 24 hours ending 08/04/18 1102 Filed Weights   08/03/18 0627  Weight: 66.7 kg    Telemetry    NSR - Personally Reviewed  ECG    NA - Personally Reviewed  Physical Exam   GEN: No acute distress.   Neck: No  JVD Cardiac: RRR, no murmurs, rubs, or gallops.  Respiratory: Clear  to auscultation bilaterally. GI: Soft, nontender, non-distended  MS: No  edema; No deformity. Neuro:  Nonfocal  Psych: Normal affect   Labs    Chemistry Recent Labs  Lab 08/02/18 2319  NA 141  K 3.8  CL 106  CO2 20*  GLUCOSE 83  BUN 13  CREATININE 1.12  CALCIUM 9.3  GFRNONAA >60  GFRAA >60  ANIONGAP 15     Hematology Recent Labs  Lab 08/02/18 2319  WBC 5.8  RBC 4.30  HGB 11.7*  HCT 36.5*  MCV 84.9  MCH 27.2  MCHC 32.1  RDW 14.1  PLT 277    Cardiac Enzymes Recent Labs  Lab 08/03/18 0313 08/03/18 0930 08/03/18 1513  TROPONINI <0.03 <0.03  <0.03    Recent Labs  Lab 08/02/18 2326  TROPIPOC 0.01     BNP Recent Labs  Lab 08/03/18 0003  BNP 24.1     DDimer No results for input(s): DDIMER in the last 168 hours.   Radiology    Dg Chest 2 View  Result Date: 08/02/2018 CLINICAL DATA:  57 y/o  M; chest pain and shortness of breath today. EXAM: CHEST - 2 VIEW COMPARISON:  07/12/2018 chest radiograph. FINDINGS: Stable heart size and mediastinal contours are within normal limits. Both lungs are clear. Right upper quadrant surgical clips, presumably cholecystectomy. Mild degenerative changes of the thoracic spine. IMPRESSION: No acute pulmonary process identified. Electronically Signed   By: Mitzi Hansen M.D.   On: 08/02/2018 23:56    Cardiac Studies   Lexiscan Myoview results pending.   Patient Profile     57 y.o. male PMH of bipolar, HTN, tobacco use and polysubstance abuse who presented with chest pain.   Assessment & Plan    CHEST PAIN:  Unable to complete a POET (Plain  Old Exercise Treadmill) because of knee pain.  Sent for [employer] Myoview.  Results pending.  No further chest pain.  Home if stress test negative.   HTN:  The blood pressure is at target. No change in medications is indicated. We will continue with therapeutic lifestyle changes (TLC).  For questions or updates, please contact [facility] Please consult [email or web address] for contact info under Cardiology/STEMI.   Signed, Rollene RotundaJames Briel Gallicchio, MD  08/04/2018, 11:02 AM

## 2018-08-04 NOTE — Progress Notes (Signed)
Pt still with chest pain, has been having morphine with help.  Will add NTG paste as BP is soft, and re check torponins.  They have been negative previously.

## 2018-08-04 NOTE — H&P (Signed)
History and Physical    [name] [name] ZOX:[id number] DOB: October 25, 1961 DOA: 08/02/2018  PCP: Clovis Riley, L.August Saucer, MD  Patient coming from: Home admitted to observation now being admitted inpatient  I have personally briefly reviewed patient's old medical records in [facility]  Chief Complaint: Recurrent chest pain  HPI: [name] [name] is a 57 y.o. male with medical history significant of [name] [name] is a 57 y.o. male with medical history significant for bipolar disorder, hypertension, tobacco and illicit substance abuse, and family history of premature CAD, now presenting to the emergency department for evaluation of chest pain.  Patient reports that he was in his usual state of health until evening 2 nights ago when he developed the acute onset of chest pain.  He was walking at the time, describes the pain as sharp, 8/10 in severity, and associated with shortness of breath.  Pain has been waxing and waning, but no alleviating or exacerbating factors are identified.  He reports associated shortness of breath, also waxing and waning, denies any significant cough, and denies fevers or chills.  Patient denies leg swelling or tenderness, personal or family history of DVT or PE, prolonged immobilization, or hemoptysis.  He has had similar symptoms and was admitted with chest pain 2 years ago, cardiology consultant recommended catheterization, that the patient refused and failed to follow-up.  He is prescribed a statin and daily baby aspirin, but has not been taking these.  He takes his ACE inhibitor sporadically.  EMS was called and the patient received 324 mg of aspirin and 1 nitroglycerin prior to arrival in the ED.  He reports no change in his symptoms with the nitroglycerin. Was placed in observation and seen in consultation by cardiology who recommended a nuclear medicine Myoview study.  An echocardiogram was also obtained which showed mildly dilated left ventricle with an EF of 50 to 55%.  When the  Myoview was obtained it showed EF of 33% and some fixed inferior lateral defects but some areas of perfusion at risk.  Cardiology has recommended admission for cardiac catheterization.   Review of Systems: As per HPI otherwise all other systems reviewed and  negative.    Past Medical History:  Diagnosis Date  . Arthritis   . Bipolar 1 disorder (HCC)   . Depression   . Hypertension   . Schizophrenia [facility])     Past Surgical History:  Procedure Laterality Date  . CHOLECYSTECTOMY N/A 12/14/2014   Procedure: LAPAROSCOPIC CHOLECYSTECTOMY WITH INTRAOPERATIVE CHOLANGIOGRAM;  Surgeon: Harriette Bouillon, MD;  Location: [facility] OR;  Service: General;  Laterality: N/A;  . HERNIA REPAIR    . NO PAST SURGERIES      Social History   Social History Narrative  . Not on file     reports that he has been smoking. He has a 35.00 pack-year smoking history. He has never used smokeless tobacco. He reports that he has current or past drug history. Drugs: Marijuana and Cocaine. He reports that he does not drink alcohol.  No Known Allergies  Family History  Problem Relation Age of Onset  . Heart attack Mother 61  . Heart attack Father 2     Prior to Admission medications   Medication Sig Start Date End Date Taking? Authorizing Provider  FLUoxetine (PROZAC) 10 MG capsule Take 10 mg by mouth daily. 05/02/18  Yes [provider]  gabapentin (NEURONTIN) 600 MG tablet Take 600 mg by mouth 3 (three) times daily.    Yes [provider]  lisinopril-hydrochlorothiazide Marcell Anger)  20-25 MG tablet Take 1 tablet by mouth daily.   Yes [provider]  QUEtiapine (SEROQUEL) 100 MG tablet Take 100 mg by mouth daily. 05/02/18  Yes [provider]  amLODipine (NORVASC) 5 MG tablet Take 1 tablet (5 mg total) by mouth daily. Patient not taking: Reported on 06/17/2018 06/13/16   Cathren Harsh, MD  aspirin EC 81 MG EC tablet Take 1 tablet (81 mg total) by mouth daily. Patient not  taking: Reported on 06/17/2018 06/14/16   Rai, Delene Ruffini, MD  atorvastatin (LIPITOR) 20 MG tablet Take 1 tablet (20 mg total) by mouth at bedtime. Patient not taking: Reported on 06/17/2018 06/13/16   Cathren Harsh, MD    Physical Exam:  Constitutional: NAD, calm, comfortable Vitals:   08/04/18 1008 08/04/18 1011 08/04/18 1013 08/04/18 1016  BP: 118/83 123/87 126/80 120/73  Pulse: 60 68 87 82  Resp:      Temp:      TempSrc:      SpO2:      Weight:      Height:       Eyes: PERRL, lids and conjunctivae normal ENMT: Mucous membranes are moist. Posterior pharynx clear of any exudate or lesions.Normal dentition.  Neck: normal, supple, no masses, no thyromegaly Respiratory: clear to auscultation bilaterally, no wheezing, no crackles. Normal respiratory effort. No accessory muscle use.  Cardiovascular: Regular rate and rhythm, no murmurs / rubs / gallops. No extremity edema. 2+ pedal pulses. No carotid bruits.  Abdomen: no tenderness, no masses palpated. No hepatosplenomegaly. Bowel sounds positive.  Musculoskeletal: no clubbing / cyanosis. No joint deformity upper and lower extremities. Good ROM, no contractures. Normal muscle tone.  Skin: no rashes, lesions, ulcers. No induration Neurologic: CN 2-12 grossly intact. Sensation intact, DTR normal. Strength 5/5 in all 4.  Psychiatric: Normal judgment and insight. Alert and oriented x 3. Normal mood.    Labs on Admission: I have personally reviewed following labs and imaging studies  CBC: Recent Labs  Lab 08/02/18 2319  WBC 5.8  HGB 11.7*  HCT 36.5*  MCV 84.9  PLT 277   Basic Metabolic Panel: Recent Labs  Lab 08/02/18 2319  NA 141  K 3.8  CL 106  CO2 20*  GLUCOSE 83  BUN 13  CREATININE 1.12  CALCIUM 9.3   GFR: Estimated Creatinine Clearance: 69.7 mL/min (by C-G formula based on SCr of 1.12 mg/dL). Cardiac Enzymes: Recent Labs  Lab 08/03/18 0313 08/03/18 0930 08/03/18 1513  TROPONINI <0.03 <0.03 <0.03   Urine  analysis:    Component Value Date/Time   COLORURINE COLORLESS (A) 07/11/2018 2231   APPEARANCEUR CLEAR 07/11/2018 2231   LABSPEC 1.002 (L) 07/11/2018 2231   PHURINE 6.0 07/11/2018 2231   GLUCOSEU NEGATIVE 07/11/2018 2231   HGBUR NEGATIVE 07/11/2018 2231   BILIRUBINUR NEGATIVE 07/11/2018 2231   KETONESUR NEGATIVE 07/11/2018 2231   PROTEINUR NEGATIVE 07/11/2018 2231   UROBILINOGEN 0.2 09/16/2014 1756   NITRITE NEGATIVE 07/11/2018 2231   LEUKOCYTESUR NEGATIVE 07/11/2018 2231    Radiological Exams on Admission: Dg Chest 2 View  Result Date: 08/02/2018 CLINICAL DATA:  57 y/o  M; chest pain and shortness of breath today. EXAM: CHEST - 2 VIEW COMPARISON:  07/12/2018 chest radiograph. FINDINGS: Stable heart size and mediastinal contours are within normal limits. Both lungs are clear. Right upper quadrant surgical clips, presumably cholecystectomy. Mild degenerative changes of the thoracic spine. IMPRESSION: No acute pulmonary process identified. Electronically Signed   By: Buzzy Han.D.  On: 08/02/2018 23:56   Nm Myocar Multi W/spect W/wall Motion / Ef  Result Date: 08/04/2018  There was no ST segment deviation noted during stress.  No T wave inversion was noted during stress.  This is an intermediate risk study.  The left ventricular ejection fraction is moderately decreased (30-44%).  1. EF 33%, diffuse hypokinesis. 2. Primarily fixed, medium-sized, mild basal inferolateral and basal to apical inferior perfusion defect.  This suggests possible prior infarction with peri-infarct ischemia (versus diaphragmatic artifact as hypokinesis is diffuse and not limited to inferior wall). Intermediate risk study.   Dg Abd Acute W/chest  Result Date: 08/04/2018 CLINICAL DATA:  Abdominal hernia. EXAM: DG ABDOMEN ACUTE W/ 1V CHEST COMPARISON:  Chest x-ray dated 08/02/2018. FINDINGS: Single-view of the chest: Heart size and mediastinal contours are within normal limits. Lungs are clear. No  pleural effusions seen. Osseous structures about the chest are unremarkable. Old healed fracture of the LEFT fourth rib. Supine and upright views of the abdomen: Bowel gas pattern is nonobstructive. No evidence of soft tissue mass or abnormal fluid collection. No evidence of free intraperitoneal air. No evidence of renal or ureteral calculi. Cholecystectomy clips in the RIGHT upper quadrant. No acute or suspicious osseous finding. IMPRESSION: 1. Negative abdominal radiographs. Nonobstructive bowel gas pattern. 2. No acute cardiopulmonary abnormality. No evidence of pneumonia or pulmonary edema. Electronically Signed   By: Bary RichardStan  Maynard M.D.   On: 08/04/2018 13:11    EKG: Independently reviewed.  Sinus tachycardia, left atrial enlargement and nonspecific ST-T wave abnormalities  Assessment/Plan Principal Problem:   Unstable angina (HCC) Active Problems:   Essential hypertension   Ischemic dilated cardiomyopathy (HCC)   Schizoaffective disorder, depressive type (HCC)   Smoking   1.   Unstable angina - Presents with chest pain, unchanged with NTG prior to arrival  - EKG with non-specific ST & T-wave abnormalities that appear similar to prior, initial troponin is negative, and CXR unremarkable  - He was treated with ASA 324 mg by EMS prior to arrival in ED  - Risk-factors include FHx of early CAD in both parents, tobacco abuse, and HTN - Cardiology recommended cath in August 2017 but patient refused at that time and failed to follow-up  Echocardiogram showed ischemic dilated cardiomyopathy and a Myoview showed an EF of 30% with at risk myocardium.  Patient will proceed to cardiac catheterization per recommendation of cardiology.  2. Hypertension  - BP at goal  - Continue lisinopril-HCTZ  3.  Ischemic dilated cardiomyopathy: We will continue present medication management for now.  Ideally at discharge will start patient on an ACE or an ARB if possible.  Would also recommend use of a statin,  aspirin, and beta-blocker if indicated.  4.  Schizoaffective disorder depressive type: Continue Seroquel and Prozac.  Patient lives at a halfway house    3. Depression, anxiety  - Continue Seroquel and Prozac     DVT prophylaxis: Lovenox Code Status: Full code Family Communication: Patient retains capacity no family available Disposition Plan: Back to prior living situation Consults called: Cardiology Admission status: Convert from observation to inpatient due to diagnosis of unstable angina   Lahoma Crockerheresa C Sheehan MD FACP [facility] Pager [phone]  If 7PM-7AM, please contact night-coverage [email or web address] Password [email or web address]  08/04/2018, 3:45 PM

## 2018-08-04 NOTE — Progress Notes (Signed)
Pt could not walk on treadmill due to Knee issues.  nuc study was changed to lexiscan.

## 2018-08-04 NOTE — Progress Notes (Signed)
Nuc study is abnormal with peri-infarction ischemia.   Discussed with Dr. Daiva NakayamaJ. Hochrein plan for cath -- we will be by to discuss with pt.

## 2018-08-04 NOTE — Progress Notes (Signed)
Pt transported to nuc med for stress test.

## 2018-08-04 NOTE — Care Management Obs Status (Addendum)
MEDICARE OBSERVATION STATUS NOTIFICATION   Patient Details  Name: [name] [name] MRN: [id number] Date of Birth: 02-03-1961   Medicare Observation Status Notification Given:  Yes   Colleen CanNatalie Dantonio Justen RN, BSN, NCM-BC, ACM-RN [phone] 08/04/2018, 3:21 PM

## 2018-08-04 NOTE — Progress Notes (Signed)
Reviewed the results of his stress test with the patient.   His stress test was abnl, with scar and peri-infarct ischemia. Results below.  The risks and benefits of a cardiac catheterization including, but not limited to, death, stroke, MI, kidney damage and bleeding were discussed with the patient who indicates understanding and agrees to proceed.   He is scheduled for tomorrow at 10:30 am w/ Dr Katrinka BlazingSmith.   Theodore DemarkRhonda Barrett, PA-C 08/04/2018 4:41 PM Beeper [phone]  Nm Myocar Multi W/spect W/wall Motion / Ef  Result Date: 08/04/2018  There was no ST segment deviation noted during stress.  No T wave inversion was noted during stress.  This is an intermediate risk study.  The left ventricular ejection fraction is moderately decreased (30-44%).  1. EF 33%, diffuse hypokinesis. 2. Primarily fixed, medium-sized, mild basal inferolateral and basal to apical inferior perfusion defect.  This suggests possible prior infarction with peri-infarct ischemia (versus diaphragmatic artifact as hypokinesis is diffuse and not limited to inferior wall). Intermediate risk study.

## 2018-08-04 NOTE — Care Management Note (Signed)
Case Management Note  Patient Details  Name: [name] [name] MRN: [id number] Date of Birth: 10-16-1961  Subjective/Objective: 57 y.o. male PMH of bipolar, HTN, tobacco use and polysubstance abuse who presented with chest pain.                  Action/Plan: Patient met with patient to discuss transitional needs. Patient indicated being a resident of [facility] for [facility] treatment. PCP: Dr. Alroy Dust; Pharmacy of choice: [facility], [place]. Patient verbalized [facility] confirmed his spot would be held during his hospitalization, with daughter to transport home once stable. No further needs from CM at this time; will continue to follow.   Expected Discharge Date:  08/04/18               Expected Discharge Plan:  Group Home([facility])  In-House Referral:  NA  Discharge planning Services  CM Consult  Post Acute Care Choice:  NA Choice offered to:  NA  DME Arranged:  N/A DME Agency:  NA  HH Arranged:  NA HH Agency:  NA  Status of Service:  In process, will continue to follow  If discussed at [place] Length of Stay Meetings, dates discussed:    Additional Comments:  Midge Minium RN, BSN, NCM-BC, ACM-RN [phone] 08/04/2018, 3:29 PM

## 2018-08-04 NOTE — Progress Notes (Signed)
nuc study stress portion only has been done without complication.

## 2018-08-05 ENCOUNTER — Encounter (HOSPITAL_COMMUNITY)
Admission: EM | Disposition: A | Discharge status: Home / Self Care | Payer: Self-pay | Source: Home / Self Care | Attending: Internal Medicine

## 2018-08-05 DIAGNOSIS — R0789 Other chest pain: Secondary | ICD-10-CM | POA: Diagnosis present

## 2018-08-05 DIAGNOSIS — K469 Unspecified abdominal hernia without obstruction or gangrene: Secondary | ICD-10-CM | POA: Diagnosis present

## 2018-08-05 DIAGNOSIS — D649 Anemia, unspecified: Secondary | ICD-10-CM | POA: Diagnosis present

## 2018-08-05 DIAGNOSIS — F17210 Nicotine dependence, cigarettes, uncomplicated: Secondary | ICD-10-CM | POA: Diagnosis present

## 2018-08-05 DIAGNOSIS — I10 Essential (primary) hypertension: Secondary | ICD-10-CM | POA: Diagnosis present

## 2018-08-05 DIAGNOSIS — I255 Ischemic cardiomyopathy: Secondary | ICD-10-CM | POA: Diagnosis present

## 2018-08-05 DIAGNOSIS — I200 Unstable angina: Secondary | ICD-10-CM

## 2018-08-05 DIAGNOSIS — I25110 Atherosclerotic heart disease of native coronary artery with unstable angina pectoris: Principal | ICD-10-CM

## 2018-08-05 DIAGNOSIS — R079 Chest pain, unspecified: Secondary | ICD-10-CM | POA: Diagnosis present

## 2018-08-05 DIAGNOSIS — I420 Dilated cardiomyopathy: Secondary | ICD-10-CM | POA: Diagnosis present

## 2018-08-05 DIAGNOSIS — F1410 Cocaine abuse, uncomplicated: Secondary | ICD-10-CM | POA: Diagnosis present

## 2018-08-05 DIAGNOSIS — F251 Schizoaffective disorder, depressive type: Secondary | ICD-10-CM | POA: Diagnosis present

## 2018-08-05 DIAGNOSIS — Z8249 Family history of ischemic heart disease and other diseases of the circulatory system: Secondary | ICD-10-CM | POA: Diagnosis not present

## 2018-08-05 HISTORY — PX: LEFT HEART CATH AND CORONARY ANGIOGRAPHY: CATH118249

## 2018-08-05 LAB — CBC
HCT: 40.5 % (ref 39.0–52.0)
Hemoglobin: 12.7 g/dL — ABNORMAL LOW (ref 13.0–17.0)
MCH: 26.8 pg (ref 26.0–34.0)
MCHC: 31.4 g/dL (ref 30.0–36.0)
MCV: 85.6 fL (ref 78.0–100.0)
PLATELETS: 269 10*3/uL (ref 150–400)
RBC: 4.73 MIL/uL (ref 4.22–5.81)
RDW: 13.6 % (ref 11.5–15.5)
WBC: 3.8 10*3/uL — AB (ref 4.0–10.5)

## 2018-08-05 LAB — TROPONIN I: Troponin I: <0.03 ng/mL (ref <0.03)

## 2018-08-05 LAB — PROTIME-INR
INR: 0.93
PROTHROMBIN TIME: 12.4 s (ref 11.4–15.2)

## 2018-08-05 LAB — CREATININE, SERUM
CREATININE: 1.02 mg/dL (ref 0.61–1.24)
GFR calc non Af Amer: >60 mL/min (ref >60)

## 2018-08-05 SURGERY — LEFT HEART CATH AND CORONARY ANGIOGRAPHY
Anesthesia: LOCAL

## 2018-08-05 MED ORDER — LIDOCAINE HCL (PF) 1 % IJ SOLN
INTRAMUSCULAR | Status: DC | PRN
  Administered 2018-08-05: 2 mL

## 2018-08-05 MED ORDER — HEPARIN (PORCINE) IN NACL 1000-0.9 UT/500ML-% IV SOLN
INTRAVENOUS | Status: DC | PRN
  Administered 2018-08-05 (×2): 500 mL

## 2018-08-05 MED ORDER — ACETAMINOPHEN 325 MG PO TABS: 650.0000 mg | ORAL_TABLET | ORAL | Status: DC | PRN

## 2018-08-05 MED ORDER — SODIUM CHLORIDE 0.9 % IV SOLN: 250.0000 mL | INTRAVENOUS | Status: DC | PRN

## 2018-08-05 MED ORDER — SODIUM CHLORIDE 0.9 % IV SOLN
INTRAVENOUS | Status: AC | PRN
  Administered 2018-08-05: 250 mL via INTRAVENOUS

## 2018-08-05 MED ORDER — LIDOCAINE HCL (PF) 1 % IJ SOLN
INTRAMUSCULAR | Status: AC
  Filled 2018-08-05: qty 30

## 2018-08-05 MED ORDER — SODIUM CHLORIDE 0.9 % IV SOLN: INTRAVENOUS | Status: AC

## 2018-08-05 MED ORDER — FENTANYL CITRATE (PF) 100 MCG/2ML IJ SOLN
INTRAMUSCULAR | Status: DC | PRN
  Administered 2018-08-05: 50 ug via INTRAVENOUS
  Administered 2018-08-05: 25 ug via INTRAVENOUS

## 2018-08-05 MED ORDER — VERAPAMIL HCL 2.5 MG/ML IV SOLN
INTRAVENOUS | Status: DC | PRN
  Administered 2018-08-05: 10 mL via INTRA_ARTERIAL

## 2018-08-05 MED ORDER — HEPARIN SODIUM (PORCINE) 1000 UNIT/ML IJ SOLN
INTRAMUSCULAR | Status: DC | PRN
  Administered 2018-08-05: 3500 [IU] via INTRAVENOUS

## 2018-08-05 MED ORDER — HEPARIN (PORCINE) IN NACL 1000-0.9 UT/500ML-% IV SOLN
INTRAVENOUS | Status: AC
  Filled 2018-08-05: qty 1000

## 2018-08-05 MED ORDER — MIDAZOLAM HCL 2 MG/2ML IJ SOLN
INTRAMUSCULAR | Status: DC | PRN
  Administered 2018-08-05: 2 mg via INTRAVENOUS

## 2018-08-05 MED ORDER — VERAPAMIL HCL 2.5 MG/ML IV SOLN
INTRAVENOUS | Status: AC
  Filled 2018-08-05: qty 2

## 2018-08-05 MED ORDER — SODIUM CHLORIDE 0.9% FLUSH
3.0000 mL | Freq: Two times a day (BID) | INTRAVENOUS | Status: DC
  Administered 2018-08-05 – 2018-08-06 (×2): 3 mL via INTRAVENOUS

## 2018-08-05 MED ORDER — SODIUM CHLORIDE 0.9% FLUSH: 3.0000 mL | INTRAVENOUS | Status: DC | PRN

## 2018-08-05 MED ORDER — HEPARIN SODIUM (PORCINE) 5000 UNIT/ML IJ SOLN
5000.0000 [IU] | Freq: Three times a day (TID) | INTRAMUSCULAR | Status: DC
  Administered 2018-08-06: 5000 [IU] via SUBCUTANEOUS
  Filled 2018-08-05: qty 1

## 2018-08-05 MED ORDER — ONDANSETRON HCL 4 MG/2ML IJ SOLN: 4.0000 mg | Freq: Four times a day (QID) | INTRAMUSCULAR | Status: DC | PRN

## 2018-08-05 MED ORDER — MIDAZOLAM HCL 2 MG/2ML IJ SOLN
INTRAMUSCULAR | Status: AC
  Filled 2018-08-05: qty 2

## 2018-08-05 MED ORDER — FENTANYL CITRATE (PF) 100 MCG/2ML IJ SOLN
INTRAMUSCULAR | Status: AC
  Filled 2018-08-05: qty 2

## 2018-08-05 MED ORDER — LORAZEPAM 2 MG/ML IJ SOLN
1.0000 mg | Freq: Once | INTRAMUSCULAR | Status: AC
  Administered 2018-08-05: 1 mg via INTRAVENOUS
  Filled 2018-08-05: qty 1

## 2018-08-05 SURGICAL SUPPLY — 9 items
CATH 5FR JL3.5 JR4 ANG PIG MP (CATHETERS) ×2 IMPLANT
DEVICE RAD COMP TR BAND LRG (VASCULAR PRODUCTS) ×2 IMPLANT
GLIDESHEATH SLEND SS 6F .021 (SHEATH) ×2 IMPLANT
GUIDEWIRE INQWIRE 1.5J.035X260 (WIRE) ×1 IMPLANT
INQWIRE 1.5J .035X260CM (WIRE) ×2
KIT HEART LEFT (KITS) ×2 IMPLANT
PACK CARDIAC CATHETERIZATION (CUSTOM PROCEDURE TRAY) ×2 IMPLANT
TRANSDUCER W/STOPCOCK (MISCELLANEOUS) ×2 IMPLANT
TUBING CIL FLEX 10 FLL-RA (TUBING) ×2 IMPLANT

## 2018-08-05 NOTE — Progress Notes (Signed)
Pt transported to cath lab.  

## 2018-08-05 NOTE — Progress Notes (Signed)
Received patient from cath lab with TR band to right radial; patient alert and oriented x4, all vital signs stable. Patient very anxious/agitated. Patient noncompliant with extremity restrictions despite repeated education from staff. Patient noncompliant with safety measures and putting on own clothes. Patient irritable and verbally aggressive towards staff. Patient expressing concern about belongings that he states were left on 5C, specifically a bar of soap. This RN searched patient's prior room, 5C-08, and did not find any belongings.

## 2018-08-05 NOTE — Progress Notes (Signed)
Asked by case manager to place an order to make pt an inpatient. Apparently, this was done earlier today but the order "disappeared" on transfer. Order placed.  KJKG, NP [facility]

## 2018-08-05 NOTE — Progress Notes (Signed)
PROGRESS NOTE    [name] [name]  ZOX:[id number] DOB: Jun 19, 1961 DOA: 08/02/2018 PCP: Patient, No Pcp Per    Brief Narrative: [name] Sproutnthony Kinslow is a 57 y.o. male with medical history significant of [name] [name] a 57 y.o.malewith medical history significant forbipolar disorder, hypertension, tobacco and illicit substance abuse, and family history of premature CAD, now presenting to the emergency department for evaluation of chest pain.  He has had similar symptoms and was admitted with chest pain 2 years ago, cardiology consultant recommended catheterization, that the patient refused and failed to follow-up. He is prescribed a statin and daily baby aspirin, but has not been taking these. He takes his ACE inhibitor sporadically.  Patient has been seen by cardiology (Dr. Antoine PocheHochrein) cardiogram showed mildly dilated left ventricle with an ejection fraction of 50 to 55% and patient proceeded to a nuclear Myoview.  Virtually that showed an ejection fraction of 33% and fixed inferior lateral defects.  Patient is to undergo cardiac catheterization today.  Assessment & Plan:   Principal Problem:   Unstable angina (HCC) Active Problems:   Essential hypertension   Ischemic dilated cardiomyopathy (HCC)   Schizoaffective disorder, depressive type (HCC)   Smoking   1.  Unstable angina -Presents with chest pain, unchanged with NTG prior to arrival -EKG with non-specific ST & T-wave abnormalities that appear similar to prior, initial troponin is negative, and CXR unremarkable -He was treated with ASA 324 mg by EMS prior to arrival in ED -Risk-factors include FHx of early CAD in both parents, tobacco abuse, and HTN -Cardiology recommended cath in August 2017 but patient refused at that time and failed to follow-up Echocardiogram showed ischemic dilated cardiomyopathy and a Myoview showed an EF of 30% with at risk myocardium.  Patient will proceed to cardiac catheterization per recommendation of  cardiology today.  2.Hypertension -BP at goal -Continue lisinopril-HCTZ  3.  Ischemic dilated cardiomyopathy: We will continue present medication management for now.  Ideally at discharge will start patient on an ACE or an ARB if possible.  Would also recommend use of a statin, aspirin, and beta-blocker if indicated.  4.  Schizoaffective disorder depressive type: Continue Seroquel and Prozac.  Patient lives at a halfway house.  His thinking can be very tangential and often it is difficult to communicate complex issues with him in terms that he can understand and retain area 5.Depression, anxiety -Continue Seroquel and Prozac  6.  Tobacco abuse: Patient counseled to quit smoking.  I spent 4 minutes doing this.   DVT prophylaxis: Lovenox Code Status: Full code Family Communication: Patient retains capacity no family available Disposition Plan: Back to prior living situation Consults called: Cardiology Admission status: Convert from observation to inpatient due to diagnosis of unstable angina    Procedures:   Echocardiogram: Mildly dilated left ventricle with EF of 50 to 55%  Myoview: EF of 33% physics inferior lateral defects but some areas of perfusion at risk.  Cardiac catheterization scheduled for today   Subjective: Notes further chest pain.  Awaiting cardiac cath.  To relieve that his housing situation is stable.  Objective: Vitals:   08/04/18 1016 08/04/18 1551 08/04/18 1820 08/04/18 2220  BP: 120/73 115/81 118/79 107/66  Pulse: 82 68 63 62  Resp:  16 16 15   Temp:  97.7 F (36.5 C) 98.1 F (36.7 C) 97.8 F (36.6 C)  TempSrc:  Oral Oral Oral  SpO2:  100% 100% 100%  Weight:      Height:       No  intake or output data in the 24 hours ending 08/05/18 1007 Filed Weights   08/03/18 0627  Weight: 66.7 kg    Examination:  General exam: Appears calm and comfortable  Respiratory system: Clear to auscultation. Respiratory effort  normal. Cardiovascular system: S1 & S2 heard, RRR. No JVD, murmurs, rubs, gallops or clicks. No pedal edema. Gastrointestinal system: Abdomen is nondistended, soft and nontender. No organomegaly or masses felt. Normal bowel sounds heard. Central nervous system: Alert and oriented. No focal neurological deficits. Extremities: Symmetric 5 x 5 power. Skin: No rashes, lesions or ulcers Psychiatry: Judgement and insight appear normal. Mood & affect appropriate.     Data Reviewed: I have personally reviewed following labs and imaging studies  CBC: Recent Labs  Lab 08/02/18 2319  WBC 5.8  HGB 11.7*  HCT 36.5*  MCV 84.9  PLT 277   Basic Metabolic Panel: Recent Labs  Lab 08/02/18 2319  NA 141  K 3.8  CL 106  CO2 20*  GLUCOSE 83  BUN 13  CREATININE 1.12  CALCIUM 9.3   GFR: Estimated Creatinine Clearance: 69.7 mL/min (by C-G formula based on SCr of 1.12 mg/dL). Liver Function Tests: No results for input(s): AST, ALT, ALKPHOS, BILITOT, PROT, ALBUMIN in the last 168 hours. No results for input(s): LIPASE, AMYLASE in the last 168 hours. No results for input(s): AMMONIA in the last 168 hours. Coagulation Profile: Recent Labs  Lab 08/05/18 0236  INR 0.93   Cardiac Enzymes: Recent Labs  Lab 08/03/18 0930 08/03/18 1513 08/04/18 2017 08/05/18 0236 08/05/18 0756  TROPONINI <0.03 <0.03 <0.03 <0.03 <0.03   BNP (last 3 results) No results for input(s): PROBNP in the last 8760 hours. HbA1C: No results for input(s): HGBA1C in the last 72 hours. CBG: No results for input(s): GLUCAP in the last 168 hours. Lipid Profile: No results for input(s): CHOL, HDL, LDLCALC, TRIG, CHOLHDL, LDLDIRECT in the last 72 hours. Thyroid Function Tests: No results for input(s): TSH, T4TOTAL, FREET4, T3FREE, THYROIDAB in the last 72 hours. Anemia Panel: No results for input(s): VITAMINB12, FOLATE, FERRITIN, TIBC, IRON, RETICCTPCT in the last 72 hours. Sepsis Labs: No results for input(s):  PROCALCITON, LATICACIDVEN in the last 168 hours.  No results found for this or any previous visit (from the past 240 hour(s)).       Radiology Studies: Nm Myocar Multi W/spect W/wall Motion / Ef  Result Date: 08/04/2018  There was no ST segment deviation noted during stress.  No T wave inversion was noted during stress.  This is an intermediate risk study.  The left ventricular ejection fraction is moderately decreased (30-44%).  1. EF 33%, diffuse hypokinesis. 2. Primarily fixed, medium-sized, mild basal inferolateral and basal to apical inferior perfusion defect.  This suggests possible prior infarction with peri-infarct ischemia (versus diaphragmatic artifact as hypokinesis is diffuse and not limited to inferior wall). Intermediate risk study.   Dg Abd Acute W/chest  Result Date: 08/04/2018 CLINICAL DATA:  Abdominal hernia. EXAM: DG ABDOMEN ACUTE W/ 1V CHEST COMPARISON:  Chest x-ray dated 08/02/2018. FINDINGS: Single-view of the chest: Heart size and mediastinal contours are within normal limits. Lungs are clear. No pleural effusions seen. Osseous structures about the chest are unremarkable. Old healed fracture of the LEFT fourth rib. Supine and upright views of the abdomen: Bowel gas pattern is nonobstructive. No evidence of soft tissue mass or abnormal fluid collection. No evidence of free intraperitoneal air. No evidence of renal or ureteral calculi. Cholecystectomy clips in the RIGHT upper quadrant. No acute  or suspicious osseous finding. IMPRESSION: 1. Negative abdominal radiographs. Nonobstructive bowel gas pattern. 2. No acute cardiopulmonary abnormality. No evidence of pneumonia or pulmonary edema. Electronically Signed   By: Bary Richard M.D.   On: 08/04/2018 13:11        Scheduled Meds: . aspirin EC  81 mg Oral Daily  . atorvastatin  20 mg Oral q1800  . enoxaparin (LOVENOX) injection  40 mg Subcutaneous Q24H  . FLUoxetine  10 mg Oral Daily  . gabapentin  600 mg Oral TID   . lisinopril  20 mg Oral Daily   And  . hydrochlorothiazide  25 mg Oral Daily  . nicotine  21 mg Transdermal Daily  . nitroGLYCERIN  1 inch Topical Q8H  . QUEtiapine  100 mg Oral Daily   Continuous Infusions:   LOS: 0 days    Time spent: 35 minutes    Lahoma Crocker, MD, FACP [facility] Pager [phone]  If 7PM-7AM, please contact night-coverage [email or web address] Password [email or web address] 08/05/2018, 10:07 AM

## 2018-08-05 NOTE — Plan of Care (Signed)
  Problem: Education: Goal: Knowledge of General Education information will improve Description Pt's L.CP is improving currently 3/10. Pt educated on Cardiac Cath  Outcome: Progressing   Problem: Health Behavior/Discharge Planning: Goal: Ability to manage health-related needs will improve Outcome: Progressing   Problem: Clinical Measurements: Goal: Ability to maintain clinical measurements within normal limits will improve Outcome: Progressing Goal: Will remain free from infection Outcome: Progressing Goal: Diagnostic test results will improve Outcome: Progressing Goal: Respiratory complications will improve Outcome: Progressing Goal: Cardiovascular complication will be avoided Outcome: Progressing   Problem: Activity: Goal: Risk for activity intolerance will decrease Outcome: Progressing   Problem: Nutrition: Goal: Adequate nutrition will be maintained Outcome: Progressing   Problem: Coping: Goal: Level of anxiety will decrease Outcome: Progressing   Problem: Elimination: Goal: Will not experience complications related to bowel motility Outcome: Progressing Goal: Will not experience complications related to urinary retention Outcome: Progressing   Problem: Pain Managment: Goal: General experience of comfort will improve Outcome: Progressing   Problem: Safety: Goal: Ability to remain free from injury will improve Outcome: Progressing   Problem: Skin Integrity: Goal: Risk for impaired skin integrity will decrease Outcome: Progressing

## 2018-08-05 NOTE — Interval H&P Note (Signed)
Cath Lab Visit (complete for each Cath Lab visit)  Clinical Evaluation Leading to the Procedure:   ACS: Yes.    Non-ACS:    Anginal Classification: CCS IV  Anti-ischemic medical therapy: Minimal Therapy (1 class of medications)  Non-Invasive Test Results: No non-invasive testing performed  Prior CABG: No previous CABG      History and Physical Interval Note:  08/05/2018 4:12 PM  [name] [name]  has presented today for surgery, with the diagnosis of positive stress  The various methods of treatment have been discussed with the patient and family. After consideration of risks, benefits and other options for treatment, the patient has consented to  Procedure(s): LEFT HEART CATH AND CORONARY ANGIOGRAPHY (N/A) as a surgical intervention .  The patient's history has been reviewed, patient examined, no change in status, stable for surgery.  I have reviewed the patient's chart and labs.  Questions were answered to the patient's satisfaction.     Lance MussJayadeep Varanasi

## 2018-08-05 NOTE — H&P (View-Only) (Signed)
Progress Note  Patient Name: [name] [name] Date of Encounter: 08/05/2018  Primary Cardiologist:   No primary care provider on file.   Subjective   No chest pain.  No SOB  Inpatient Medications    Scheduled Meds: . aspirin EC  81 mg Oral Daily  . atorvastatin  20 mg Oral q1800  . enoxaparin (LOVENOX) injection  40 mg Subcutaneous Q24H  . FLUoxetine  10 mg Oral Daily  . gabapentin  600 mg Oral TID  . lisinopril  20 mg Oral Daily   And  . hydrochlorothiazide  25 mg Oral Daily  . nicotine  21 mg Transdermal Daily  . nitroGLYCERIN  1 inch Topical Q8H  . QUEtiapine  100 mg Oral Daily   Continuous Infusions:  PRN Meds: acetaminophen, morphine injection, nitroGLYCERIN, ondansetron (ZOFRAN) IV   Vital Signs    Vitals:   08/04/18 1016 08/04/18 1551 08/04/18 1820 08/04/18 2220  BP: 120/73 115/81 118/79 107/66  Pulse: 82 68 63 62  Resp:  16 16 15   Temp:  97.7 F (36.5 C) 98.1 F (36.7 C) 97.8 F (36.6 C)  TempSrc:  Oral Oral Oral  SpO2:  100% 100% 100%  Weight:      Height:       No intake or output data in the 24 hours ending 08/05/18 0958 Filed Weights   08/03/18 0627  Weight: 66.7 kg    Telemetry    NSR - Personally Reviewed  ECG    NA - Personally Reviewed  Physical Exam   GEN: No  acute distress.   Neck: No  JVD Cardiac: RRR, no murmurs, rubs, or gallops.  Respiratory: Clear   to auscultation bilaterally. GI: Soft, nontender, non-distended, normal bowel sounds  MS:    No edema; No deformity. Neuro:   Nonfocal  Psych: Oriented and appropriate   Labs    Chemistry Recent Labs  Lab 08/02/18 2319  NA 141  K 3.8  CL 106  CO2 20*  GLUCOSE 83  BUN 13  CREATININE 1.12  CALCIUM 9.3  GFRNONAA >60  GFRAA >60  ANIONGAP 15     Hematology Recent Labs  Lab 08/02/18 2319  WBC 5.8  RBC 4.30  HGB 11.7*  HCT 36.5*  MCV 84.9  MCH 27.2  MCHC 32.1  RDW 14.1  PLT 277    Cardiac Enzymes Recent Labs  Lab 08/03/18 1513 08/04/18 2017  08/05/18 0236 08/05/18 0756  TROPONINI <0.03 <0.03 <0.03 <0.03    Recent Labs  Lab 08/02/18 2326  TROPIPOC 0.01     BNP Recent Labs  Lab 08/03/18 0003  BNP 24.1     DDimer No results for input(s): DDIMER in the last 168 hours.   Radiology    Nm Myocar Multi W/spect W/wall Motion / Ef  Result Date: 08/04/2018  There was no ST segment deviation noted during stress.  No T wave inversion was noted during stress.  This is an intermediate risk study.  The left ventricular ejection fraction is moderately decreased (30-44%).  1. EF 33%, diffuse hypokinesis. 2. Primarily fixed, medium-sized, mild basal inferolateral and basal to apical inferior perfusion defect.  This suggests possible prior infarction with peri-infarct ischemia (versus diaphragmatic artifact as hypokinesis is diffuse and not limited to inferior wall). Intermediate risk study.   Dg Abd Acute W/chest  Result Date: 08/04/2018 CLINICAL DATA:  Abdominal hernia. EXAM: DG ABDOMEN ACUTE W/ 1V CHEST COMPARISON:  Chest x-ray dated 08/02/2018. FINDINGS: Single-view of the chest: Heart size and  mediastinal contours are within normal limits. Lungs are clear. No pleural effusions seen. Osseous structures about the chest are unremarkable. Old healed fracture of the LEFT fourth rib. Supine and upright views of the abdomen: Bowel gas pattern is nonobstructive. No evidence of soft tissue mass or abnormal fluid collection. No evidence of free intraperitoneal air. No evidence of renal or ureteral calculi. Cholecystectomy clips in the RIGHT upper quadrant. No acute or suspicious osseous finding. IMPRESSION: 1. Negative abdominal radiographs. Nonobstructive bowel gas pattern. 2. No acute cardiopulmonary abnormality. No evidence of pneumonia or pulmonary edema. Electronically Signed   By: Bary Richard M.D.   On: 08/04/2018 13:11    Cardiac Studies    Lexiscan Myoview   There was no ST segment deviation noted during stress.  No T wave  inversion was noted during stress.  This is an intermediate risk study.  The left ventricular ejection fraction is moderately decreased (30-44%).   1. EF 33%, diffuse hypokinesis.  2. Primarily fixed, medium-sized, mild basal inferolateral and basal to apical inferior perfusion defect.  This suggests possible prior infarction with peri-infarct ischemia (versus diaphragmatic artifact as hypokinesis is diffuse and not limited to inferior wall).    Echo  - Left ventricle: The cavity size was mildly dilated. Wall   thickness was normal. Systolic function was normal. The estimated   ejection fraction was in the range of 50% to 55%. - Mitral valve: There was mild regurgitation.   Patient Profile     57 y.o. male PMH of bipolar, HTN, tobacco use and polysubstance abuse who presented with chest pain.   Assessment & Plan    CHEST PAIN:  Abnormal stress test as above.  Cath today.  The patient understands that risks included but are not limited to stroke (1 in 1000), death (1 in 1000), kidney failure [usually temporary] (1 in 500), bleeding (1 in 200), allergic reaction [possibly serious] (1 in 200).  The patient understands and agrees to proceed.   HTN:  The blood pressure is well controlled.    POLYSUBSTANCE ABUSE:  Educate as to the role that cocaine might play in the presentation.    For questions or updates, please contact [facility] Please consult [email or web address] for contact info under Cardiology/STEMI.   Signed, Rollene Rotunda, MD  08/05/2018, 9:58 AM

## 2018-08-05 NOTE — Progress Notes (Signed)
Patient sisters  [name] HausenBetty [name] [phone] [name] MouldFlorence [name] [phone]

## 2018-08-05 NOTE — Progress Notes (Signed)
 Progress Note  Patient Name: [name] [name] Date of Encounter: 08/05/2018  Primary Cardiologist:   No primary care provider on file.   Subjective   No chest pain.  No SOB  Inpatient Medications    Scheduled Meds: . aspirin EC  81 mg Oral Daily  . atorvastatin  20 mg Oral q1800  . enoxaparin (LOVENOX) injection  40 mg Subcutaneous Q24H  . FLUoxetine  10 mg Oral Daily  . gabapentin  600 mg Oral TID  . lisinopril  20 mg Oral Daily   And  . hydrochlorothiazide  25 mg Oral Daily  . nicotine  21 mg Transdermal Daily  . nitroGLYCERIN  1 inch Topical Q8H  . QUEtiapine  100 mg Oral Daily   Continuous Infusions:  PRN Meds: acetaminophen, morphine injection, nitroGLYCERIN, ondansetron (ZOFRAN) IV   Vital Signs    Vitals:   08/04/18 1016 08/04/18 1551 08/04/18 1820 08/04/18 2220  BP: 120/73 115/81 118/79 107/66  Pulse: 82 68 63 62  Resp:  16 16 15  Temp:  97.7 F (36.5 C) 98.1 F (36.7 C) 97.8 F (36.6 C)  TempSrc:  Oral Oral Oral  SpO2:  100% 100% 100%  Weight:      Height:       No intake or output data in the 24 hours ending 08/05/18 0958 Filed Weights   08/03/18 0627  Weight: 66.7 kg    Telemetry    NSR - Personally Reviewed  ECG    NA - Personally Reviewed  Physical Exam   GEN: No  acute distress.   Neck: No  JVD Cardiac: RRR, no murmurs, rubs, or gallops.  Respiratory: Clear   to auscultation bilaterally. GI: Soft, nontender, non-distended, normal bowel sounds  MS:    No edema; No deformity. Neuro:   Nonfocal  Psych: Oriented and appropriate   Labs    Chemistry Recent Labs  Lab 08/02/18 2319  NA 141  K 3.8  CL 106  CO2 20*  GLUCOSE 83  BUN 13  CREATININE 1.12  CALCIUM 9.3  GFRNONAA >60  GFRAA >60  ANIONGAP 15     Hematology Recent Labs  Lab 08/02/18 2319  WBC 5.8  RBC 4.30  HGB 11.7*  HCT 36.5*  MCV 84.9  MCH 27.2  MCHC 32.1  RDW 14.1  PLT 277    Cardiac Enzymes Recent Labs  Lab 08/03/18 1513 08/04/18 2017  08/05/18 0236 08/05/18 0756  TROPONINI <0.03 <0.03 <0.03 <0.03    Recent Labs  Lab 08/02/18 2326  TROPIPOC 0.01     BNP Recent Labs  Lab 08/03/18 0003  BNP 24.1     DDimer No results for input(s): DDIMER in the last 168 hours.   Radiology    Nm Myocar Multi W/spect W/wall Motion / Ef  Result Date: 08/04/2018  There was no ST segment deviation noted during stress.  No T wave inversion was noted during stress.  This is an intermediate risk study.  The left ventricular ejection fraction is moderately decreased (30-44%).  1. EF 33%, diffuse hypokinesis. 2. Primarily fixed, medium-sized, mild basal inferolateral and basal to apical inferior perfusion defect.  This suggests possible prior infarction with peri-infarct ischemia (versus diaphragmatic artifact as hypokinesis is diffuse and not limited to inferior wall). Intermediate risk study.   Dg Abd Acute W/chest  Result Date: 08/04/2018 CLINICAL DATA:  Abdominal hernia. EXAM: DG ABDOMEN ACUTE W/ 1V CHEST COMPARISON:  Chest x-ray dated 08/02/2018. FINDINGS: Single-view of the chest: Heart size and   mediastinal contours are within normal limits. Lungs are clear. No pleural effusions seen. Osseous structures about the chest are unremarkable. Old healed fracture of the LEFT fourth rib. Supine and upright views of the abdomen: Bowel gas pattern is nonobstructive. No evidence of soft tissue mass or abnormal fluid collection. No evidence of free intraperitoneal air. No evidence of renal or ureteral calculi. Cholecystectomy clips in the RIGHT upper quadrant. No acute or suspicious osseous finding. IMPRESSION: 1. Negative abdominal radiographs. Nonobstructive bowel gas pattern. 2. No acute cardiopulmonary abnormality. No evidence of pneumonia or pulmonary edema. Electronically Signed   By: Stan  Maynard M.D.   On: 08/04/2018 13:11    Cardiac Studies    Lexiscan Myoview   There was no ST segment deviation noted during stress.  No T wave  inversion was noted during stress.  This is an intermediate risk study.  The left ventricular ejection fraction is moderately decreased (30-44%).   1. EF 33%, diffuse hypokinesis.  2. Primarily fixed, medium-sized, mild basal inferolateral and basal to apical inferior perfusion defect.  This suggests possible prior infarction with peri-infarct ischemia (versus diaphragmatic artifact as hypokinesis is diffuse and not limited to inferior wall).    Echo  - Left ventricle: The cavity size was mildly dilated. Wall   thickness was normal. Systolic function was normal. The estimated   ejection fraction was in the range of 50% to 55%. - Mitral valve: There was mild regurgitation.   Patient Profile     57 y.o. male PMH of bipolar, HTN, tobacco use and polysubstance abuse who presented with chest pain.   Assessment & Plan    CHEST PAIN:  Abnormal stress test as above.  Cath today.  The patient understands that risks included but are not limited to stroke (1 in 1000), death (1 in 1000), kidney failure [usually temporary] (1 in 500), bleeding (1 in 200), allergic reaction [possibly serious] (1 in 200).  The patient understands and agrees to proceed.   HTN:  The blood pressure is well controlled.    POLYSUBSTANCE ABUSE:  Educate as to the role that cocaine might play in the presentation.    For questions or updates, please contact [facility] Please consult [email or web address] for contact info under Cardiology/STEMI.   Signed, Takeisha Cianci, MD  08/05/2018, 9:58 AM    

## 2018-08-06 ENCOUNTER — Encounter (HOSPITAL_COMMUNITY): Payer: Self-pay | Admitting: Interventional Cardiology

## 2018-08-06 DIAGNOSIS — F251 Schizoaffective disorder, depressive type: Secondary | ICD-10-CM

## 2018-08-06 DIAGNOSIS — I10 Essential (primary) hypertension: Secondary | ICD-10-CM

## 2018-08-06 DIAGNOSIS — D649 Anemia, unspecified: Secondary | ICD-10-CM

## 2018-08-06 MED ORDER — AMLODIPINE BESYLATE 5 MG PO TABS: 5.0000 mg | ORAL_TABLET | Freq: Every day | ORAL | 3 refills | Status: DC

## 2018-08-06 MED ORDER — LISINOPRIL-HYDROCHLOROTHIAZIDE 20-25 MG PO TABS: 1.0000 | ORAL_TABLET | Freq: Every day | ORAL | 0 refills | Status: DC

## 2018-08-06 MED ORDER — CLOPIDOGREL BISULFATE 75 MG PO TABS
75.0000 mg | ORAL_TABLET | Freq: Every day | ORAL | Status: DC
  Administered 2018-08-06: 75 mg via ORAL
  Filled 2018-08-06: qty 1

## 2018-08-06 MED ORDER — ATORVASTATIN CALCIUM 20 MG PO TABS: 20.0000 mg | ORAL_TABLET | Freq: Every day | ORAL | 3 refills | Status: DC

## 2018-08-06 MED ORDER — AMLODIPINE BESYLATE 5 MG PO TABS
5.0000 mg | ORAL_TABLET | Freq: Every day | ORAL | Status: DC
  Administered 2018-08-06: 5 mg via ORAL
  Filled 2018-08-06: qty 1

## 2018-08-06 MED ORDER — CLOPIDOGREL BISULFATE 75 MG PO TABS: 75.0000 mg | ORAL_TABLET | Freq: Every day | ORAL | 0 refills | Status: DC

## 2018-08-06 MED ORDER — QUETIAPINE FUMARATE 100 MG PO TABS: 100.0000 mg | ORAL_TABLET | Freq: Every day | ORAL | 0 refills | Status: DC

## 2018-08-06 NOTE — Progress Notes (Signed)
Rollene Rotunda, MD  Laurann Montana, PA-C        Need follow up two weeks APP       Follow up with Joni Reining, NP  [facility] [place] location - Wednesday Aug 20, 2018 9:00 AM.  Appt info added to AVS.   Ronie Spies PA-C
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
## 2018-08-06 NOTE — Progress Notes (Signed)
Progress Note  Patient Name: [name] [name] Date of Encounter: 08/06/2018  Primary Cardiologist:   Rollene Rotunda, MD   Subjective   No chest pain.  No SOB.  He has LLQ abdominal tenderness  Inpatient Medications    Scheduled Meds: . aspirin EC  81 mg Oral Daily  . atorvastatin  20 mg Oral q1800  . FLUoxetine  10 mg Oral Daily  . gabapentin  600 mg Oral TID  . heparin  5,000 Units Subcutaneous Q8H  . lisinopril  20 mg Oral Daily   And  . hydrochlorothiazide  25 mg Oral Daily  . nicotine  21 mg Transdermal Daily  . nitroGLYCERIN  1 inch Topical Q8H  . QUEtiapine  100 mg Oral Daily  . sodium chloride flush  3 mL Intravenous Q12H   Continuous Infusions: . sodium chloride     PRN Meds: sodium chloride, acetaminophen, morphine injection, nitroGLYCERIN, ondansetron (ZOFRAN) IV, sodium chloride flush   Vital Signs    Vitals:   08/05/18 1713 08/05/18 1729 08/05/18 2127 08/06/18 0538  BP: 119/68  96/81 106/70  Pulse: (!) 56  92 91  Resp: 14  18 16   Temp:  (!) 97.5 F (36.4 C) 98.2 F (36.8 C) 98.3 F (36.8 C)  TempSrc:  Oral Oral Oral  SpO2: 100%  100% 99%  Weight:    74.9 kg  Height:  5\' 7"  (1.702 m)      Intake/Output Summary (Last 24 hours) at 08/06/2018 1025 Last data filed at 08/06/2018 0600 Gross per 24 hour  Intake 560.12 ml  Output 0 ml  Net 560.12 ml   Filed Weights   08/03/18 0627 08/06/18 0538  Weight: 66.7 kg 74.9 kg    Telemetry    NSR - Personally Reviewed  ECG    NA - Personally Reviewed  Physical Exam   GEN: No  acute distress.   Neck: No  JVD Cardiac: RRR, no murmurs, rubs, or gallops.  Respiratory: Clear  to auscultation bilaterally. GI: Soft, nontender, non-distended, normal bowel sounds  MS:  No edema; No deformity. Neuro:   Nonfocal  Psych: Oriented and appropriate    Labs    Chemistry Recent Labs  Lab 08/02/18 2319 08/05/18 0756  NA 141  --   K 3.8  --   CL 106  --   CO2 20*  --   GLUCOSE 83  --   BUN 13  --     CREATININE 1.12 1.02  CALCIUM 9.3  --   GFRNONAA >60 >60  GFRAA >60 >60  ANIONGAP 15  --      Hematology Recent Labs  Lab 08/02/18 2319 08/05/18 0756  WBC 5.8 3.8*  RBC 4.30 4.73  HGB 11.7* 12.7*  HCT 36.5* 40.5  MCV 84.9 85.6  MCH 27.2 26.8  MCHC 32.1 31.4  RDW 14.1 13.6  PLT 277 269    Cardiac Enzymes Recent Labs  Lab 08/03/18 1513 08/04/18 2017 08/05/18 0236 08/05/18 0756  TROPONINI <0.03 <0.03 <0.03 <0.03    Recent Labs  Lab 08/02/18 2326  TROPIPOC 0.01     BNP Recent Labs  Lab 08/03/18 0003  BNP 24.1     DDimer No results for input(s): DDIMER in the last 168 hours.   Radiology    Nm Myocar Multi W/spect W/wall Motion / Ef  Result Date: 08/04/2018  There was no ST segment deviation noted during stress.  No T wave inversion was noted during stress.  This is an intermediate risk  study.  The left ventricular ejection fraction is moderately decreased (30-44%).  1. EF 33%, diffuse hypokinesis. 2. Primarily fixed, medium-sized, mild basal inferolateral and basal to apical inferior perfusion defect.  This suggests possible prior infarction with peri-infarct ischemia (versus diaphragmatic artifact as hypokinesis is diffuse and not limited to inferior wall). Intermediate risk study.   Dg Abd Acute W/chest  Result Date: 08/04/2018 CLINICAL DATA:  Abdominal hernia. EXAM: DG ABDOMEN ACUTE W/ 1V CHEST COMPARISON:  Chest x-ray dated 08/02/2018. FINDINGS: Single-view of the chest: Heart size and mediastinal contours are within normal limits. Lungs are clear. No pleural effusions seen. Osseous structures about the chest are unremarkable. Old healed fracture of the LEFT fourth rib. Supine and upright views of the abdomen: Bowel gas pattern is nonobstructive. No evidence of soft tissue mass or abnormal fluid collection. No evidence of free intraperitoneal air. No evidence of renal or ureteral calculi. Cholecystectomy clips in the RIGHT upper quadrant. No acute or  suspicious osseous finding. IMPRESSION: 1. Negative abdominal radiographs. Nonobstructive bowel gas pattern. 2. No acute cardiopulmonary abnormality. No evidence of pneumonia or pulmonary edema. Electronically Signed   By: Bary Richard M.D.   On: 08/04/2018 13:11    Cardiac Studies    Lexiscan Myoview   There was no ST segment deviation noted during stress.  No T wave inversion was noted during stress.  This is an intermediate risk study.  The left ventricular ejection fraction is moderately decreased (30-44%).   1. EF 33%, diffuse hypokinesis.  2. Primarily fixed, medium-sized, mild basal inferolateral and basal to apical inferior perfusion defect.  This suggests possible prior infarction with peri-infarct ischemia (versus diaphragmatic artifact as hypokinesis is diffuse and not limited to inferior wall).    Echo  - Left ventricle: The cavity size was mildly dilated. Wall   thickness was normal. Systolic function was normal. The estimated   ejection fraction was in the range of 50% to 55%. - Mitral valve: There was mild regurgitation.  Cath  Mid RCA lesion is 25% stenosed.  Post Atrio lesion is 25% stenosed.  Anamolous origin of the circumflex from the right cusp. Dist Cx lesion is 50% stenosed, possible vasospasm as it improved during the cath.  The left ventricular systolic function is normal.  LV end diastolic pressure is low. LVEDP 5 mm Hg.  The left ventricular ejection fraction is 55-65% by visual estimate.  There is no aortic valve stenosis.  Mild, diffuse nonobstructive disease in the RCA and LAD.   Avoid cocaine.  Treat for vasospasm.  Aggressive preventive therapy.   Patient Profile     57 y.o. male PMH of bipolar, HTN, tobacco use and polysubstance abuse who presented with chest pain.   Assessment & Plan    CAD:  Distal vessel as above.  OK to send home from our standpoint.  I have reviewed the films and discussed with Dr. Eldridge Dace.  I would suggest  ASA and Plavix.  Lipitor.   I will also add Norvasc.    HTN:  The blood pressure is well controlled.   I will add Norvasc and stop hydralazine and HCTZ.    POLYSUBSTANCE ABUSE:  Educated as to the role that cocaine might play in the presentation.   We discussed this today.  ABDOMINAL PAIN:  Per primary team.   For questions or updates, please contact [facility] Please consult [email or web address] for contact info under Cardiology/STEMI.   Signed, Rollene Rotunda, MD  08/06/2018, 10:25 AM

## 2018-08-06 NOTE — Clinical Social Work Note (Signed)
Clinical Social Work Assessment  Patient Details  Name: [name] [name] MRN: [id number] Date of Birth: 01/30/1961  Date of referral:  08/06/18               Reason for consult:  Community Resources, Transportation, Substance Use/ETOH Abuse, Mental Health Concerns                Permission sought to share information with:  Other Permission granted to share information::  Yes, Verbal Permission Granted  Name::     [facility] [place]  Agency::     Relationship::     Contact Information:     Housing/Transportation Living arrangements for the past 2 months:  Apartment Source of Information:  Patient Patient Interpreter Needed:  None Criminal Activity/Legal Involvement Pertinent to Current Situation/Hospitalization:  No - Comment as needed Significant Relationships:  Adult Children, Other Family Members Lives with:  Self Do you feel safe going back to the place where you live?  Yes Need for family participation in patient care:  No (Coment)  Care giving concerns: Patient from [facility] program. CSW consulted for substance use resources; patient positive for cocaine on admission.   Social Worker assessment / plan: CSW met with patient at bedside. Patient alert and oriented. CSW introduced self and reason for consult. CSW assessed patient's living situation and substance use. Patient reported he has been staying in an apartment through [facility] for about three months. He does not want to continue living there and has been looking for alternate housing; he is on the waiting list for housing through the [facility] [facility].   CSW reflected that patient tested positive for cocaine and patient acknowledged this. Patient reported he goes to class for substance use and mental health issues at [facility] 5 days per week. Patient was interested in other community resources for substance use treatment, as he is trying to leave the [facility] program. CSW provided list of  inpatient and outpatient treatment resources. Patient requested CSW make referral to [facility] in [place]. CSW to make referral to [facility]. MD did advise patient that he would be discharged today and CSW requested contact information for [facility] to use to follow up with patient.  CSW will sign off, as patient discharging today and no additional needs identified.  Employment status:  Disabled (Comment on whether or not currently receiving Disability) Insurance information:  Medicaid In State PT Recommendations:  Not assessed at this time Information / Referral to community resources:  Outpatient Substance Abuse Treatment Options, Residential Substance Abuse Treatment Options, Outpatient Psychiatric Care (Comment Required), [facility]  Patient/Family's Response to care: Patient appreciative of care and resources.  Patient/Family's Understanding of and Emotional Response to Diagnosis, Current Treatment, and Prognosis: Patient with understanding of his condition. Plans to return to [facility] today and continue looking for alternate housing and substance use treatment.  Emotional Assessment Appearance:  Appears stated age Attitude/Demeanor/Rapport:  Engaged Affect (typically observed):  Calm, Accepting, Appropriate, Pleasant Orientation:  Oriented to Self, Oriented to Place, Oriented to  Time, Oriented to Situation Alcohol / Substance use:  Illicit Drugs Psych involvement (Current and /or in the community):  No (Comment)  Discharge Needs  Concerns to be addressed:  Substance Abuse Concerns, Homelessness, Mental Health Concerns Readmission within the last 30 days:  No Current discharge risk:  Psychiatric Illness, Homeless, Substance Abuse Barriers to Discharge:  No Barriers Identified    , LCSW 08/06/2018, 11:02 AM  

## 2018-08-19 NOTE — Progress Notes (Deleted)
Cardiology Office Note   Date:  08/19/2018   ID:  [name] [name], DOB [birth year], MRN [id number]  PCP:  Patient, No Pcp Per  Cardiologist:  Hochrein  No chief complaint on file.    History of Present Illness: [name] [name] is a 57 y.o. male who presents for post hospital follow up after admission for chest pain. He has a history of Bipolar, HTN, polysubstance abuse, and ongoing tobacco abuse. He was recommended for cardiac cath which revealed non-obstructive CAD, he was continued on primary prevention, advised to stop polysubstance abuse and to stop smoking. He was also complaining of abdominal pain, which was found to be related to non-incarcerated abdominal hernia.     Past Medical History:  Diagnosis Date  . Arthritis   . Bipolar 1 disorder (HCC)   . Depression   . Hypertension   . Schizophrenia [facility])     Past Surgical History:  Procedure Laterality Date  . CHOLECYSTECTOMY N/A 12/14/2014   Procedure: LAPAROSCOPIC CHOLECYSTECTOMY WITH INTRAOPERATIVE CHOLANGIOGRAM;  Surgeon: Harriette Bouillon, MD;  Location: [facility] OR;  Service: General;  Laterality: N/A;  . HERNIA REPAIR    . LEFT HEART CATH AND CORONARY ANGIOGRAPHY N/A 08/05/2018   Procedure: LEFT HEART CATH AND CORONARY ANGIOGRAPHY;  Surgeon: Corky Crafts, MD;  Location: [facility] INVASIVE CV LAB;  Service: Cardiovascular;  Laterality: N/A;  . NO PAST SURGERIES       Current Outpatient Medications  Medication Sig Dispense Refill  . amLODipine (NORVASC) 5 MG tablet Take 1 tablet (5 mg total) by mouth daily. 30 tablet 3  . aspirin EC 81 MG EC tablet Take 1 tablet (81 mg total) by mouth daily. (Patient not taking: Reported on 06/17/2018) 30 tablet 3  . atorvastatin (LIPITOR) 20 MG tablet Take 1 tablet (20 mg total) by mouth at bedtime. 30 tablet 3  . clopidogrel (PLAVIX) 75 MG tablet Take 1 tablet (75 mg total) by mouth daily. 30 tablet 0  . FLUoxetine (PROZAC) 10 MG capsule Take 10 mg by mouth daily.  0  . gabapentin (NEURONTIN) 600 MG  tablet Take 600 mg by mouth 3 (three) times daily.     . QUEtiapine (SEROQUEL) 100 MG tablet Take 1 tablet (100 mg total) by mouth daily. 30 tablet 0   No current facility-administered medications for this visit.     Allergies:   Patient has no known allergies.    Social History:  The patient  reports that he has been smoking. He has a 35.00 pack-year smoking history. He has never used smokeless tobacco. He reports that he has current or past drug history. Drugs: Marijuana and Cocaine. He reports that he does not drink alcohol.   Family History:  The patient's family history includes Heart attack (age of onset: 104) in his mother; Heart attack (age of onset: 29) in his father.    ROS: All other systems are reviewed and negative. Unless otherwise mentioned in H&P    PHYSICAL EXAM: VS:  There were no vitals taken for this visit. , BMI There is no height or weight on file to calculate BMI. GEN: Well nourished, well developed, in no acute distress HEENT: normal Neck: no JVD, carotid bruits, or masses Cardiac: ***RRR; no murmurs, rubs, or gallops,no edema  Respiratory:  Clear to auscultation bilaterally, normal work of breathing GI: soft, nontender, nondistended, + BS MS: no deformity or atrophy Skin: warm and dry, no rash Neuro:  Strength and sensation are intact Psych: euthymic mood, full affect   EKG:  EKG {ACTION; IS/IS [id number]} ordered today. The ekg ordered today demonstrates ***   Recent Labs: 07/11/2018: ALT 17 08/02/2018: BUN 13; Potassium 3.8; Sodium 141 08/03/2018: B Natriuretic Peptide 24.1 08/05/2018: Creatinine, Ser 1.02; Hemoglobin 12.7; Platelets 269    Lipid Panel    Component Value Date/Time   CHOL 179 06/13/2016 0730   TRIG 52 06/13/2016 0730   HDL 47 06/13/2016 0730   CHOLHDL 3.8 06/13/2016 0730   VLDL 10 06/13/2016 0730   LDLCALC 122 (H) 06/13/2016 0730      Wt Readings from Last 3 Encounters:  08/06/18 165 lb 3.2 oz (74.9 kg)  07/11/18 160 lb  (72.6 kg)  05/26/18 160 lb (72.6 kg)      Other studies Reviewed: Lexiscan Myoview   There was no ST segment deviation noted during stress.  No T wave inversion was noted during stress.  This is an intermediate risk study.  The left ventricular ejection fraction is moderately decreased (30-44%).  1. EF 33%, diffuse hypokinesis.  2. Primarily fixed, medium-sized, mild basal inferolateral and basal to apical inferior perfusion defect. This suggests possible prior infarction with peri-infarct ischemia (versus diaphragmatic artifact as hypokinesis is diffuse and not limited to inferior wall).    Echo  - Left ventricle: The cavity size was mildly dilated. Wall thickness was normal. Systolic function was normal. The estimated ejection fraction was in the range of 50% to 55%. - Mitral valve: There was mild regurgitation.  Cath  Mid RCA lesion is 25% stenosed.  Post Atrio lesion is 25% stenosed.  Anamolous origin of the circumflex from the right cusp. Dist Cx lesion is 50% stenosed, possible vasospasm as it improved during the cath.  The left ventricular systolic function is normal.  LV end diastolic pressure is low. LVEDP 5 mm Hg.  The left ventricular ejection fraction is 55-65% by visual estimate.  There is no aortic valve stenosis.  Mild, diffuse nonobstructive disease in the RCA and LAD.  Avoid cocaine. Treat for vasospasm. Aggressive preventive therapy.   ASSESSMENT AND PLAN:  1.  ***   Current medicines are reviewed at length with the patient today.    Labs/ tests ordered today include: *** Bettey Mare. Liborio Nixon, ANP, AACC   08/19/2018 7:22 AM    [facility] [address] Office [phone] Fax [phone]

## 2018-08-20 ENCOUNTER — Ambulatory Visit: Payer: Medicare Other | Admitting: Adult Health

## 2018-08-21 ENCOUNTER — Encounter: Payer: Self-pay | Admitting: *Deleted

## 2018-09-12 ENCOUNTER — Emergency Department (HOSPITAL_COMMUNITY): Payer: Medicare Other

## 2018-09-12 ENCOUNTER — Encounter (HOSPITAL_COMMUNITY): Payer: Self-pay | Admitting: Emergency Medicine

## 2018-09-12 ENCOUNTER — Emergency Department (HOSPITAL_COMMUNITY)
Admission: EM | Admit: 2018-09-12 | Discharge: 2018-09-13 | Disposition: A | Discharge status: Home / Self Care | Payer: Medicare Other | Attending: Emergency Medicine | Admitting: Emergency Medicine

## 2018-09-12 ENCOUNTER — Other Ambulatory Visit: Payer: Self-pay

## 2018-09-12 DIAGNOSIS — F209 Schizophrenia, unspecified: Secondary | ICD-10-CM | POA: Insufficient documentation

## 2018-09-12 DIAGNOSIS — F1911 Other psychoactive substance abuse, in remission: Secondary | ICD-10-CM

## 2018-09-12 DIAGNOSIS — R55 Syncope and collapse: Secondary | ICD-10-CM

## 2018-09-12 DIAGNOSIS — R079 Chest pain, unspecified: Secondary | ICD-10-CM | POA: Insufficient documentation

## 2018-09-12 DIAGNOSIS — I10 Essential (primary) hypertension: Secondary | ICD-10-CM | POA: Insufficient documentation

## 2018-09-12 DIAGNOSIS — F1410 Cocaine abuse, uncomplicated: Secondary | ICD-10-CM

## 2018-09-12 DIAGNOSIS — F17210 Nicotine dependence, cigarettes, uncomplicated: Secondary | ICD-10-CM | POA: Insufficient documentation

## 2018-09-12 DIAGNOSIS — Z79899 Other long term (current) drug therapy: Secondary | ICD-10-CM | POA: Insufficient documentation

## 2018-09-12 LAB — CBC
HEMATOCRIT: 43.5 % (ref 39.0–52.0)
Hemoglobin: 13.9 g/dL (ref 13.0–17.0)
MCH: 27.5 pg (ref 26.0–34.0)
MCHC: 32 g/dL (ref 30.0–36.0)
MCV: 86 fL (ref 80.0–100.0)
NRBC: 0 % (ref 0.0–0.2)
Platelets: 301 10*3/uL (ref 150–400)
RBC: 5.06 MIL/uL (ref 4.22–5.81)
RDW: 13.8 % (ref 11.5–15.5)
WBC: 6.8 10*3/uL (ref 4.0–10.5)

## 2018-09-12 LAB — URINALYSIS, ROUTINE W REFLEX MICROSCOPIC
BILIRUBIN URINE: NEGATIVE
GLUCOSE, UA: NEGATIVE mg/dL
HGB URINE DIPSTICK: NEGATIVE
KETONES UR: NEGATIVE mg/dL
Leukocytes, UA: NEGATIVE
Nitrite: NEGATIVE
Protein, ur: NEGATIVE mg/dL
Specific Gravity, Urine: 1.002 — ABNORMAL LOW (ref 1.005–1.030)
pH: 6 (ref 5.0–8.0)

## 2018-09-12 LAB — I-STAT TROPONIN, ED: Troponin i, poc: 0.01 ng/mL (ref 0.00–0.08)

## 2018-09-12 NOTE — ED Notes (Signed)
Pt wanting to leave from triage room to go to phone and vending machine.  Asked pt to wait until labs are drawn.

## 2018-09-12 NOTE — ED Triage Notes (Addendum)
Pt to triage via [facility]> c/o dizziness x 2 days that got worse while standing in line at [employer].  Near syncopal episode.  Reports L sided neck pain x 1 week and soreness to L chest.  Denies nausea. Denies SOB.  No neuro deficits noted on triage exam.  Ambulatory without difficulty.

## 2018-09-13 ENCOUNTER — Inpatient Hospital Stay (HOSPITAL_COMMUNITY)
Admission: AD | Admit: 2018-09-13 | Discharge: 2018-09-16 | DRG: 897 | Disposition: A | Discharge status: Home / Self Care | Payer: Medicare Other | Source: Intra-hospital | Attending: Psychiatry | Admitting: Psychiatry

## 2018-09-13 ENCOUNTER — Other Ambulatory Visit: Payer: Self-pay

## 2018-09-13 ENCOUNTER — Encounter (HOSPITAL_COMMUNITY): Payer: Self-pay | Admitting: Emergency Medicine

## 2018-09-13 ENCOUNTER — Encounter (HOSPITAL_COMMUNITY): Payer: Self-pay

## 2018-09-13 ENCOUNTER — Emergency Department (HOSPITAL_COMMUNITY)
Admission: EM | Admit: 2018-09-13 | Discharge: 2018-09-13 | Disposition: A | Discharge status: Other Acute Inpatient Hospital | Payer: Medicare Other | Source: Home / Self Care | Attending: Emergency Medicine | Admitting: Emergency Medicine

## 2018-09-13 DIAGNOSIS — F17210 Nicotine dependence, cigarettes, uncomplicated: Secondary | ICD-10-CM | POA: Diagnosis present

## 2018-09-13 DIAGNOSIS — Z79899 Other long term (current) drug therapy: Secondary | ICD-10-CM | POA: Insufficient documentation

## 2018-09-13 DIAGNOSIS — Z23 Encounter for immunization: Secondary | ICD-10-CM | POA: Diagnosis present

## 2018-09-13 DIAGNOSIS — F251 Schizoaffective disorder, depressive type: Secondary | ICD-10-CM | POA: Diagnosis present

## 2018-09-13 DIAGNOSIS — F329 Major depressive disorder, single episode, unspecified: Secondary | ICD-10-CM | POA: Insufficient documentation

## 2018-09-13 DIAGNOSIS — F14959 Cocaine use, unspecified with cocaine-induced psychotic disorder, unspecified: Secondary | ICD-10-CM | POA: Diagnosis present

## 2018-09-13 DIAGNOSIS — F17200 Nicotine dependence, unspecified, uncomplicated: Secondary | ICD-10-CM | POA: Insufficient documentation

## 2018-09-13 DIAGNOSIS — Z046 Encounter for general psychiatric examination, requested by authority: Secondary | ICD-10-CM

## 2018-09-13 DIAGNOSIS — R45851 Suicidal ideations: Secondary | ICD-10-CM | POA: Diagnosis present

## 2018-09-13 DIAGNOSIS — Z9114 Patient's other noncompliance with medication regimen: Secondary | ICD-10-CM

## 2018-09-13 DIAGNOSIS — Z765 Malingerer [conscious simulation]: Secondary | ICD-10-CM | POA: Diagnosis not present

## 2018-09-13 DIAGNOSIS — I10 Essential (primary) hypertension: Secondary | ICD-10-CM | POA: Insufficient documentation

## 2018-09-13 DIAGNOSIS — R079 Chest pain, unspecified: Secondary | ICD-10-CM | POA: Diagnosis present

## 2018-09-13 DIAGNOSIS — M1990 Unspecified osteoarthritis, unspecified site: Secondary | ICD-10-CM | POA: Diagnosis present

## 2018-09-13 DIAGNOSIS — Z7902 Long term (current) use of antithrombotics/antiplatelets: Secondary | ICD-10-CM

## 2018-09-13 DIAGNOSIS — Y903 Blood alcohol level of 60-79 mg/100 ml: Secondary | ICD-10-CM | POA: Diagnosis present

## 2018-09-13 DIAGNOSIS — F14159 Cocaine abuse with cocaine-induced psychotic disorder, unspecified: Secondary | ICD-10-CM | POA: Diagnosis present

## 2018-09-13 DIAGNOSIS — F22 Delusional disorders: Secondary | ICD-10-CM | POA: Insufficient documentation

## 2018-09-13 DIAGNOSIS — R440 Auditory hallucinations: Secondary | ICD-10-CM

## 2018-09-13 DIAGNOSIS — F319 Bipolar disorder, unspecified: Secondary | ICD-10-CM | POA: Diagnosis present

## 2018-09-13 DIAGNOSIS — Z8249 Family history of ischemic heart disease and other diseases of the circulatory system: Secondary | ICD-10-CM | POA: Diagnosis not present

## 2018-09-13 DIAGNOSIS — F419 Anxiety disorder, unspecified: Secondary | ICD-10-CM | POA: Diagnosis present

## 2018-09-13 DIAGNOSIS — F1290 Cannabis use, unspecified, uncomplicated: Secondary | ICD-10-CM | POA: Diagnosis not present

## 2018-09-13 LAB — COMPREHENSIVE METABOLIC PANEL
ALBUMIN: 4.4 g/dL (ref 3.5–5.0)
ALK PHOS: 80 U/L (ref 38–126)
ALT: 17 U/L (ref 0–44)
ANION GAP: 9 (ref 5–15)
AST: 21 U/L (ref 15–41)
BILIRUBIN TOTAL: 0.6 mg/dL (ref 0.3–1.2)
BUN: 10 mg/dL (ref 6–20)
CALCIUM: 9.4 mg/dL (ref 8.9–10.3)
CO2: 20 mmol/L — ABNORMAL LOW (ref 22–32)
Chloride: 109 mmol/L (ref 98–111)
Creatinine, Ser: 0.91 mg/dL (ref 0.61–1.24)
Glucose, Bld: 73 mg/dL (ref 70–99)
POTASSIUM: 4 mmol/L (ref 3.5–5.1)
Sodium: 138 mmol/L (ref 135–145)
TOTAL PROTEIN: 7.7 g/dL (ref 6.5–8.1)

## 2018-09-13 LAB — CBC
HCT: 43.6 % (ref 39.0–52.0)
HEMOGLOBIN: 13.5 g/dL (ref 13.0–17.0)
MCH: 26.6 pg (ref 26.0–34.0)
MCHC: 31 g/dL (ref 30.0–36.0)
MCV: 85.8 fL (ref 80.0–100.0)
Platelets: 355 10*3/uL (ref 150–400)
RBC: 5.08 MIL/uL (ref 4.22–5.81)
RDW: 13.9 % (ref 11.5–15.5)
WBC: 6.7 10*3/uL (ref 4.0–10.5)
nRBC: 0 % (ref 0.0–0.2)

## 2018-09-13 LAB — BASIC METABOLIC PANEL
Anion gap: 9 (ref 5–15)
BUN: 11 mg/dL (ref 6–20)
CALCIUM: 9.6 mg/dL (ref 8.9–10.3)
CHLORIDE: 103 mmol/L (ref 98–111)
CO2: 24 mmol/L (ref 22–32)
CREATININE: 0.96 mg/dL (ref 0.61–1.24)
GFR calc non Af Amer: >60 mL/min (ref >60)
Glucose, Bld: 101 mg/dL — ABNORMAL HIGH (ref 70–99)
Potassium: 4.3 mmol/L (ref 3.5–5.1)
Sodium: 136 mmol/L (ref 135–145)

## 2018-09-13 LAB — RAPID URINE DRUG SCREEN, HOSP PERFORMED
Amphetamines: NOT DETECTED
Barbiturates: NOT DETECTED
Benzodiazepines: NOT DETECTED
COCAINE: POSITIVE — AB
OPIATES: NOT DETECTED
TETRAHYDROCANNABINOL: NOT DETECTED

## 2018-09-13 LAB — ETHANOL: ALCOHOL ETHYL (B): 71 mg/dL — AB (ref <10)

## 2018-09-13 LAB — ACETAMINOPHEN LEVEL

## 2018-09-13 LAB — SALICYLATE LEVEL

## 2018-09-13 MED ORDER — GABAPENTIN 600 MG PO TABS
600.0000 mg | ORAL_TABLET | Freq: Three times a day (TID) | ORAL | Status: DC
  Administered 2018-09-13 (×2): 600 mg via ORAL
  Filled 2018-09-13 (×3): qty 1

## 2018-09-13 MED ORDER — ALUM & MAG HYDROXIDE-SIMETH 200-200-20 MG/5ML PO SUSP: 30.0000 mL | ORAL | Status: DC | PRN

## 2018-09-13 MED ORDER — QUETIAPINE FUMARATE 50 MG PO TABS
100.0000 mg | ORAL_TABLET | Freq: Every day | ORAL | Status: DC
  Administered 2018-09-13: 100 mg via ORAL
  Filled 2018-09-13: qty 2
  Filled 2018-09-13: qty 1

## 2018-09-13 MED ORDER — PNEUMOCOCCAL VAC POLYVALENT 25 MCG/0.5ML IJ INJ
0.5000 mL | INJECTION | INTRAMUSCULAR | Status: AC
  Administered 2018-09-14: 0.5 mL via INTRAMUSCULAR

## 2018-09-13 MED ORDER — ATORVASTATIN CALCIUM 20 MG PO TABS
20.0000 mg | ORAL_TABLET | Freq: Every day | ORAL | Status: DC
  Administered 2018-09-13 – 2018-09-15 (×3): 20 mg via ORAL
  Filled 2018-09-13: qty 1
  Filled 2018-09-13: qty 2
  Filled 2018-09-13 (×4): qty 1

## 2018-09-13 MED ORDER — ZIPRASIDONE MESYLATE 20 MG IM SOLR: 20.0000 mg | INTRAMUSCULAR | Status: DC | PRN

## 2018-09-13 MED ORDER — NICOTINE 21 MG/24HR TD PT24
21.0000 mg | MEDICATED_PATCH | Freq: Every day | TRANSDERMAL | Status: DC
  Administered 2018-09-13: 21 mg via TRANSDERMAL
  Filled 2018-09-13: qty 1

## 2018-09-13 MED ORDER — FLUOXETINE HCL 10 MG PO CAPS
10.0000 mg | ORAL_CAPSULE | Freq: Every day | ORAL | Status: DC
  Administered 2018-09-14 – 2018-09-16 (×3): 10 mg via ORAL
  Filled 2018-09-13 (×5): qty 1

## 2018-09-13 MED ORDER — MAGNESIUM HYDROXIDE 400 MG/5ML PO SUSP: 30.0000 mL | Freq: Every day | ORAL | Status: DC | PRN

## 2018-09-13 MED ORDER — RISPERIDONE 2 MG PO TBDP
2.0000 mg | ORAL_TABLET | Freq: Three times a day (TID) | ORAL | Status: DC | PRN
  Filled 2018-09-13: qty 1

## 2018-09-13 MED ORDER — QUETIAPINE FUMARATE 100 MG PO TABS
100.0000 mg | ORAL_TABLET | Freq: Every day | ORAL | Status: DC
  Administered 2018-09-14 – 2018-09-15 (×2): 100 mg via ORAL
  Filled 2018-09-13 (×3): qty 1

## 2018-09-13 MED ORDER — AMLODIPINE BESYLATE 5 MG PO TABS
5.0000 mg | ORAL_TABLET | Freq: Every day | ORAL | Status: DC
  Administered 2018-09-13: 5 mg via ORAL
  Filled 2018-09-13: qty 1

## 2018-09-13 MED ORDER — ATORVASTATIN CALCIUM 20 MG PO TABS: 20.0000 mg | ORAL_TABLET | Freq: Every day | ORAL | Status: DC

## 2018-09-13 MED ORDER — HYDROXYZINE HCL 25 MG PO TABS: 25.0000 mg | ORAL_TABLET | Freq: Three times a day (TID) | ORAL | Status: DC | PRN

## 2018-09-13 MED ORDER — FLUOXETINE HCL 10 MG PO CAPS
10.0000 mg | ORAL_CAPSULE | Freq: Every day | ORAL | Status: DC
  Administered 2018-09-13: 10 mg via ORAL
  Filled 2018-09-13 (×2): qty 1

## 2018-09-13 MED ORDER — CLOPIDOGREL BISULFATE 75 MG PO TABS
75.0000 mg | ORAL_TABLET | Freq: Every day | ORAL | Status: DC
  Administered 2018-09-14 – 2018-09-16 (×3): 75 mg via ORAL
  Filled 2018-09-13 (×5): qty 1

## 2018-09-13 MED ORDER — ACETAMINOPHEN 325 MG PO TABS: 650.0000 mg | ORAL_TABLET | ORAL | Status: DC | PRN

## 2018-09-13 MED ORDER — LORAZEPAM 1 MG PO TABS: 1.0000 mg | ORAL_TABLET | ORAL | Status: DC | PRN

## 2018-09-13 MED ORDER — CLOPIDOGREL BISULFATE 75 MG PO TABS
75.0000 mg | ORAL_TABLET | Freq: Every day | ORAL | Status: DC
  Administered 2018-09-13: 75 mg via ORAL
  Filled 2018-09-13: qty 1

## 2018-09-13 MED ORDER — ONDANSETRON HCL 4 MG PO TABS: 4.0000 mg | ORAL_TABLET | Freq: Three times a day (TID) | ORAL | Status: DC | PRN

## 2018-09-13 MED ORDER — GABAPENTIN 600 MG PO TABS
600.0000 mg | ORAL_TABLET | Freq: Three times a day (TID) | ORAL | Status: DC
  Administered 2018-09-14 (×2): 600 mg via ORAL
  Filled 2018-09-13 (×7): qty 1

## 2018-09-13 MED ORDER — AMLODIPINE BESYLATE 5 MG PO TABS
5.0000 mg | ORAL_TABLET | Freq: Every day | ORAL | Status: DC
  Administered 2018-09-14 – 2018-09-16 (×3): 5 mg via ORAL
  Filled 2018-09-13 (×5): qty 1

## 2018-09-13 MED ORDER — TRAZODONE HCL 50 MG PO TABS
50.0000 mg | ORAL_TABLET | Freq: Every evening | ORAL | Status: DC | PRN
  Administered 2018-09-13 – 2018-09-14 (×2): 50 mg via ORAL
  Filled 2018-09-13 (×2): qty 1

## 2018-09-13 MED ORDER — INFLUENZA VAC SPLIT QUAD 0.5 ML IM SUSY
0.5000 mL | PREFILLED_SYRINGE | INTRAMUSCULAR | Status: AC
  Administered 2018-09-14: 0.5 mL via INTRAMUSCULAR
  Filled 2018-09-13: qty 0.5

## 2018-09-13 MED ORDER — ACETAMINOPHEN 325 MG PO TABS
650.0000 mg | ORAL_TABLET | Freq: Four times a day (QID) | ORAL | Status: DC | PRN
  Administered 2018-09-14 (×2): 650 mg via ORAL
  Filled 2018-09-13 (×2): qty 2

## 2018-09-13 NOTE — ED Notes (Signed)
Pt had woke to eat breakfast earlier this am and returned to sleeping. Pt has ambulated to bathroom x 2 w/o difficulty. Offered pt snack d/t was sleeping during snack time - declined. Pt voiced understanding of Medical Clearance Pt Policy form - copy given.

## 2018-09-13 NOTE — Progress Notes (Addendum)
Patient ID: [name] [name], male   DOB: [birth year], 57 y.o.   MRN: [id number] S- Pt seen for review after assessment last night.Reports auditory and visual ahallucinations with paranoia. Says he stopped his meds 6 months ago .Hallucinations began 2 days ago with heavy cocaine use. Pt has dx of Bipolar and Schizophrenia on chart O-Behavior-psychologically comprehensible      Orientation-Full       Affect- Flat       Perception-continues to be altered by c/o hallucination and fear of being harmed or harming someone       Thought- as noted above.Willing to come to hospital for stabilization and help with coxcaine abuse/? 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

## 2018-09-13 NOTE — ED Notes (Signed)
Pt given snacks as requested - [place] sandwich, graham crackers, crackers, peanut butter, and Sprite.

## 2018-09-13 NOTE — ED Notes (Signed)
Patient verbalizes understanding of discharge instructions. Opportunity for questioning and answers were provided. Armband removed by staff, pt discharged from ED home via POV.  

## 2018-09-13 NOTE — Progress Notes (Signed)
Did not attend group 

## 2018-09-13 NOTE — ED Triage Notes (Signed)
Reports being schizophrenic. Has been having auditory and visual hallucinations for a couple of days.  Has not taken medications for a couple of days.

## 2018-09-13 NOTE — ED Provider Notes (Signed)
[facility] EMERGENCY DEPARTMENT Provider Note   CSN: [id number] Arrival date & time: 09/12/18  2250     History   Chief Complaint Chief Complaint  Patient presents with  . Dizziness  . Near Syncope    HPI [name] [name] is a 57 y.o. male.  Patient with history of schizophrenia, HTN, bipolar and depression presents by EMS with concern for near syncopal episode that occurred earlier this evening. He reports to me he has been passing out over the last several days. He is having left sided neck discomfort that radiates to the chest but not always with the dizziness or passing out. He reports SOB. No cough or fever, no nausea or vomiting. He states he has not been eating or drinking per his usual. He states he had a cardiac cath recently and had stents placed.   The history is provided by the patient. No language interpreter was used.  Dizziness  Associated symptoms: chest pain and shortness of breath   Associated symptoms: no nausea and no vomiting   Near Syncope  Associated symptoms include chest pain and shortness of breath. Pertinent negatives include no abdominal pain.    Past Medical History:  Diagnosis Date  . Arthritis   . Bipolar 1 disorder (HCC)   . Depression   . Hypertension   . Schizophrenia [facility])     Patient Active Problem List   Diagnosis Date Noted  . Chest pain 08/05/2018  . Ischemic dilated cardiomyopathy (HCC) 08/04/2018  . Unstable angina (HCC) 06/13/2016  . Essential hypertension 06/13/2016  . Smoking 06/13/2016  . Family history of heart attack   . Schizoaffective disorder, depressive type (HCC) 09/08/2015    Past Surgical History:  Procedure Laterality Date  . CHOLECYSTECTOMY N/A 12/14/2014   Procedure: LAPAROSCOPIC CHOLECYSTECTOMY WITH INTRAOPERATIVE CHOLANGIOGRAM;  Surgeon: Harriette Bouillon, MD;  Location: [facility] OR;  Service: General;  Laterality: N/A;  . HERNIA REPAIR    . LEFT HEART CATH AND CORONARY ANGIOGRAPHY N/A 08/05/2018   Procedure: LEFT HEART CATH AND CORONARY ANGIOGRAPHY;  Surgeon: Corky Crafts, MD;  Location: [facility] INVASIVE CV LAB;  Service: Cardiovascular;  Laterality: N/A;  . NO PAST SURGERIES          Home Medications    Prior to Admission medications   Medication Sig Start Date End Date Taking? Authorizing Provider  amLODipine (NORVASC) 5 MG tablet Take 1 tablet (5 mg total) by mouth daily. 08/06/18   Marinda Elk, MD  aspirin EC 81 MG EC tablet Take 1 tablet (81 mg total) by mouth daily. Patient not taking: Reported on 06/17/2018 06/14/16   Rai, Delene Ruffini, MD  atorvastatin (LIPITOR) 20 MG tablet Take 1 tablet (20 mg total) by mouth at bedtime. 08/06/18   Marinda Elk, MD  clopidogrel (PLAVIX) 75 MG tablet Take 1 tablet (75 mg total) by mouth daily. 08/06/18   Marinda Elk, MD  FLUoxetine (PROZAC) 10 MG capsule Take 10 mg by mouth daily. 05/02/18   [provider]  gabapentin (NEURONTIN) 600 MG tablet Take 600 mg by mouth 3 (three) times daily.     [provider]  QUEtiapine (SEROQUEL) 100 MG tablet Take 1 tablet (100 mg total) by mouth daily. 08/06/18   Marinda Elk, MD    Family History Family History  Problem Relation Age of Onset  . Heart attack Mother 19  . Heart attack Father 72    Social History Social History   Tobacco Use  . Smoking  status: Current Every Day Smoker    Packs/day: 1.00    Years: 35.00    Pack years: 35.00  . Smokeless tobacco: Never Used  Substance Use Topics  . Alcohol use: No  . Drug use: Yes    Types: Marijuana, Cocaine     Allergies   Patient has no known allergies.   Review of Systems Review of Systems  Constitutional: Negative for chills and fever.  HENT: Negative.   Respiratory: Positive for shortness of breath.   Cardiovascular: Positive for chest pain and near-syncope.  Gastrointestinal: Negative for abdominal pain, nausea and vomiting.  Musculoskeletal: Positive for neck pain (See HPI.).    Skin: Negative.   Neurological: Positive for dizziness and syncope.     Physical Exam Updated Vital Signs BP (!) 144/89 (BP Location: Right Arm)   Pulse 95   Temp 97.9 F (36.6 C) (Oral)   Resp 16   SpO2 100%   Physical Exam  Constitutional: He is oriented to person, place, and time. He appears well-developed and well-nourished.  HENT:  Head: Normocephalic.  Neck: Normal range of motion. Neck supple.  Cardiovascular: Normal rate and regular rhythm.  No murmur heard. Pulmonary/Chest: Effort normal and breath sounds normal. He has no wheezes. He has no rales.  Abdominal: Soft. Bowel sounds are normal. There is no tenderness. There is no rebound and no guarding.  Musculoskeletal: Normal range of motion. He exhibits no edema.  Neurological: He is alert and oriented to person, place, and time.  Skin: Skin is warm and dry. No rash noted.  Psychiatric: He has a normal mood and affect.  Nursing note and vitals reviewed.    ED Treatments / Results  Labs (all labs ordered are listed, but only abnormal results are displayed) Labs Reviewed  BASIC METABOLIC PANEL - Abnormal; Notable for the following components:      Result Value   Glucose, Bld 101 (*)    All other components within normal limits  URINALYSIS, ROUTINE W REFLEX MICROSCOPIC - Abnormal; Notable for the following components:   Color, Urine COLORLESS (*)    Specific Gravity, Urine 1.002 (*)    All other components within normal limits  CBC  I-STAT TROPONIN, ED   Results for orders placed or performed during the hospital encounter of 09/12/18  Basic metabolic panel  Result Value Ref Range   Sodium 136 135 - 145 mmol/L   Potassium 4.3 3.5 - 5.1 mmol/L   Chloride 103 98 - 111 mmol/L   CO2 24 22 - 32 mmol/L   Glucose, Bld 101 (H) 70 - 99 mg/dL   BUN 11 6 - 20 mg/dL   Creatinine, Ser 1.61 0.61 - 1.24 mg/dL   Calcium 9.6 8.9 - 09.6 mg/dL   GFR calc non Af Amer >60 >60 mL/min   GFR calc Af Amer >60 >60 mL/min    Anion gap 9 5 - 15  CBC  Result Value Ref Range   WBC 6.8 4.0 - 10.5 K/uL   RBC 5.06 4.22 - 5.81 MIL/uL   Hemoglobin 13.9 13.0 - 17.0 g/dL   HCT 04.5 40.9 - 81.1 %   MCV 86.0 80.0 - 100.0 fL   MCH 27.5 26.0 - 34.0 pg   MCHC 32.0 30.0 - 36.0 g/dL   RDW 91.4 78.2 - 95.6 %   Platelets 301 150 - 400 K/uL   nRBC 0.0 0.0 - 0.2 %  Urinalysis, Routine w reflex microscopic  Result Value Ref Range   Color, Urine  COLORLESS (A) YELLOW   APPearance CLEAR CLEAR   Specific Gravity, Urine 1.002 (L) 1.005 - 1.030   pH 6.0 5.0 - 8.0   Glucose, UA NEGATIVE NEGATIVE mg/dL   Hgb urine dipstick NEGATIVE NEGATIVE   Bilirubin Urine NEGATIVE NEGATIVE   Ketones, ur NEGATIVE NEGATIVE mg/dL   Protein, ur NEGATIVE NEGATIVE mg/dL   Nitrite NEGATIVE NEGATIVE   Leukocytes, UA NEGATIVE NEGATIVE  I-stat troponin, ED  Result Value Ref Range   Troponin i, poc 0.01 0.00 - 0.08 ng/mL   Comment 3            EKG None  Radiology Dg Chest 2 View  Result Date: 09/12/2018 CLINICAL DATA:  57 year old male with chest pain. EXAM: CHEST - 2 VIEW COMPARISON:  Chest radiograph dated 08/04/2018 FINDINGS: The heart size and mediastinal contours are within normal limits. Both lungs are clear. The visualized skeletal structures are unremarkable. IMPRESSION: No active cardiopulmonary disease. Electronically Signed   By: Elgie Collard M.D.   On: 09/12/2018 23:38    Procedures Procedures (including critical care time)  Medications Ordered in ED Medications - No data to display   Initial Impression / Assessment and Plan / ED Course  I have reviewed the triage vital signs and the nursing notes.  Pertinent labs & imaging results that were available during my care of the patient were reviewed by me and considered in my medical decision making (see chart for details).     Patient presents with complaint of dizziness and passing out over the last couple of days. He reports to EMS he called them for near syncope.    Chart reviewed. Cardiac cath on 08/05/18 showed minimally obstructing disease with diagnosis of 'vasospasm" with instruction to avoid cocaine use. ECHO was negative at that time as well.   Labs today are unremarkable. EKG has no changes from previous. CXR is clear. He can be discharged home with PCP follow up. Referrals provided.   Final Clinical Impressions(s) / ED Diagnoses   Final diagnoses:  None   1. Nonspecific chest pain  ED Discharge Orders    None       Elpidio Anis, PA-C 09/13/18 0256    Ward, Layla Maw, DO 09/13/18 [phone]

## 2018-09-13 NOTE — ED Notes (Signed)
Pt voiced understanding and agreement w/tx plan - accepted to [facility] - signed consent form - copy faxed to [facility], copy sent to Medical Records, and original placed in envelope for [facility]. ALL belongings - 2 labeled belongings bags - [place] - NO Valuables noted - Pt aware.

## 2018-09-13 NOTE — ED Provider Notes (Signed)
[facility] EMERGENCY DEPARTMENT Provider Note   CSN: [id number] Arrival date & time: 09/13/18  0324     History   Chief Complaint Chief Complaint  Patient presents with  . Suicidal  . Hallucinations    HPI [name] [name] is a 57 y.o. male.  Patient presents to the emergency department for evaluation of auditory hallucinations.  Patient has a history of schizophrenia.  He reports that he has forgotten to take his medicine for several days and now is hearing command auditory hallucinations.  Voices are telling him to harm himself and others.  Patient reports that he is suicidal and has a plan to jump in front of a speeding train.     Past Medical History:  Diagnosis Date  . Arthritis   . Bipolar 1 disorder (HCC)   . Depression   . Hypertension   . Schizophrenia [facility])     Patient Active Problem List   Diagnosis Date Noted  . Chest pain 08/05/2018  . Ischemic dilated cardiomyopathy (HCC) 08/04/2018  . Unstable angina (HCC) 06/13/2016  . Essential hypertension 06/13/2016  . Smoking 06/13/2016  . Family history of heart attack   . Schizoaffective disorder, depressive type (HCC) 09/08/2015    Past Surgical History:  Procedure Laterality Date  . CHOLECYSTECTOMY N/A 12/14/2014   Procedure: LAPAROSCOPIC CHOLECYSTECTOMY WITH INTRAOPERATIVE CHOLANGIOGRAM;  Surgeon: Harriette Bouillon, MD;  Location: [facility] OR;  Service: General;  Laterality: N/A;  . HERNIA REPAIR    . LEFT HEART CATH AND CORONARY ANGIOGRAPHY N/A 08/05/2018   Procedure: LEFT HEART CATH AND CORONARY ANGIOGRAPHY;  Surgeon: Corky Crafts, MD;  Location: [facility] INVASIVE CV LAB;  Service: Cardiovascular;  Laterality: N/A;  . NO PAST SURGERIES          Home Medications    Prior to Admission medications   Medication Sig Start Date End Date Taking? Authorizing Provider  amLODipine (NORVASC) 5 MG tablet Take 1 tablet (5 mg total) by mouth daily. 08/06/18   Marinda Elk, MD  aspirin EC 81 MG EC  tablet Take 1 tablet (81 mg total) by mouth daily. Patient not taking: Reported on 06/17/2018 06/14/16   Rai, Delene Ruffini, MD  atorvastatin (LIPITOR) 20 MG tablet Take 1 tablet (20 mg total) by mouth at bedtime. 08/06/18   Marinda Elk, MD  clopidogrel (PLAVIX) 75 MG tablet Take 1 tablet (75 mg total) by mouth daily. 08/06/18   Marinda Elk, MD  FLUoxetine (PROZAC) 10 MG capsule Take 10 mg by mouth daily. 05/02/18   [provider]  gabapentin (NEURONTIN) 600 MG tablet Take 600 mg by mouth 3 (three) times daily.     [provider]  QUEtiapine (SEROQUEL) 100 MG tablet Take 1 tablet (100 mg total) by mouth daily. 08/06/18   Marinda Elk, MD    Family History Family History  Problem Relation Age of Onset  . Heart attack Mother 56  . Heart attack Father 64    Social History Social History   Tobacco Use  . Smoking status: Current Every Day Smoker    Packs/day: 1.00    Years: 35.00    Pack years: 35.00  . Smokeless tobacco: Never Used  Substance Use Topics  . Alcohol use: No  . Drug use: Yes    Types: Marijuana, Cocaine     Allergies   Patient has no known allergies.   Review of Systems Review of Systems  Psychiatric/Behavioral: Positive for dysphoric mood, hallucinations and suicidal ideas.  All other systems reviewed and are negative.    Physical Exam Updated Vital Signs BP (!) 144/98 (BP Location: Right Arm)   Pulse 88   Temp 98.7 F (37.1 C) (Oral)   Resp 18   Ht 5\' 6"  (1.676 m)   Wt 63.5 kg   SpO2 99%   BMI 22.60 kg/m   Physical Exam  Constitutional: He is oriented to person, place, and time. He appears well-developed and well-nourished. No distress.  HENT:  Head: Normocephalic and atraumatic.  Right Ear: Hearing normal.  Left Ear: Hearing normal.  Nose: Nose normal.  Mouth/Throat: Oropharynx is clear and moist and mucous membranes are normal.  Eyes: Pupils are equal, round, and reactive to light. Conjunctivae and EOM  are normal.  Neck: Normal range of motion. Neck supple.  Cardiovascular: Regular rhythm, S1 normal and S2 normal. Exam reveals no gallop and no friction rub.  No murmur heard. Pulmonary/Chest: Effort normal and breath sounds normal. No respiratory distress. He exhibits no tenderness.  Abdominal: Soft. Normal appearance and bowel sounds are normal. There is no hepatosplenomegaly. There is no tenderness. There is no rebound, no guarding, no tenderness at McBurney's point and negative Murphy's sign. No hernia.  Musculoskeletal: Normal range of motion.  Neurological: He is alert and oriented to person, place, and time. He has normal strength. No cranial nerve deficit or sensory deficit. Coordination normal. GCS eye subscore is 4. GCS verbal subscore is 5. GCS motor subscore is 6.  Skin: Skin is warm, dry and intact. No rash noted. No cyanosis.  Psychiatric: His speech is normal and behavior is normal. Thought content is paranoid. He exhibits a depressed mood. He expresses homicidal and suicidal ideation. He expresses suicidal plans.  Nursing note and vitals reviewed.    ED Treatments / Results  Labs (all labs ordered are listed, but only abnormal results are displayed) Labs Reviewed  CBC  COMPREHENSIVE METABOLIC PANEL  ETHANOL  SALICYLATE LEVEL  ACETAMINOPHEN LEVEL  RAPID URINE DRUG SCREEN, HOSP PERFORMED    EKG None  Radiology Dg Chest 2 View  Result Date: 09/12/2018 CLINICAL DATA:  57 year old male with chest pain. EXAM: CHEST - 2 VIEW COMPARISON:  Chest radiograph dated 08/04/2018 FINDINGS: The heart size and mediastinal contours are within normal limits. Both lungs are clear. The visualized skeletal structures are unremarkable. IMPRESSION: No active cardiopulmonary disease. Electronically Signed   By: Elgie Collard M.D.   On: 09/12/2018 23:38    Procedures Procedures (including critical care time)  Medications Ordered in ED Medications - No data to display   Initial  Impression / Assessment and Plan / ED Course  I have reviewed the triage vital signs and the nursing notes.  Pertinent labs & imaging results that were available during my care of the patient were reviewed by me and considered in my medical decision making (see chart for details).     Patient with previous diagnosis of schizophrenia, off medications now having auditory hallucinations consisting of commands to harm himself and others.  Patient reports increased depression, suicidal ideation with plan to jump in front of a train.  Final Clinical Impressions(s) / ED Diagnoses   Final diagnoses:  Auditory hallucinations    ED Discharge Orders    None       Jonni Oelkers, Canary Brim, MD 09/13/18 [phone]
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
## 2018-09-13 NOTE — Progress Notes (Signed)
Patient ID: [name] [name], male   DOB: 11/30/1960, 57 y.o.   MRN: [id number] Per State regulations 482.30 this chart was reviewed for medical necessity with respect to the patient's admission/duration of stay.    Next review date: 09/17/18  Thurman Coyer, BSN, RN-BC  Case Manager

## 2018-09-13 NOTE — BH Assessment (Addendum)
Tele Assessment Note   Patient Name: [name] [name] MRN: [id number] Referring Physician: Gilda Crease, MD Location of Patient: [facility] Location of Provider: Behavioral Health TTS Department  [name] [name] is an 57 y.o. male who presents to the ED voluntarily. Pt initially presented to the ED due to having chest pains. Pt states while he was being d/c, he told ED staff that he was suicidal. TTS asked the pt if he had a plan to kill himself and he stated "a train." Pt states he is suicidal due to the voices in his head. Pt states "I just get tired of this." Pt states he has not been taking his medication because he forgets to take it. Pt states he lives in the [facility] and his medication is at the house. Pt states he has not taken the medication for several days.   Pt denies HI. TTS asked the pt to identify his current or past SA hx and pt states he has not used any substance for 2 months. Pt's labs are positive for cocaine and his BAL is 71 on arrival to ED. TTS asked the pt how the substances got into his system and he stated "I don't remember." Pt then asks this writer "am I in [employer]? They usually put me on the 5th floor. Am I going to [facility]? They told me I was going to [employer]. Where am I?" Pt appears preoccupied during the assessment and minimizing his drug use. Pt is unable to contract for safety at this time.   Per Donell Sievert, PA pt is recommended for continued observation due to his initial presentation being chest pains and not SI and PA recommends pt be reassessed by psych. EDP Pollina, Canary Brim, MD and pt's nurse Wynona Canes, RN have been advised.  Diagnosis: Schizophrenia   Past Medical History:  Past Medical History:  Diagnosis Date  . Arthritis   . Bipolar 1 disorder (HCC)   . Depression   . Hypertension   . Schizophrenia [facility])     Past Surgical History:  Procedure Laterality Date  . CHOLECYSTECTOMY N/A 12/14/2014   Procedure: LAPAROSCOPIC  CHOLECYSTECTOMY WITH INTRAOPERATIVE CHOLANGIOGRAM;  Surgeon: Harriette Bouillon, MD;  Location: [facility] OR;  Service: General;  Laterality: N/A;  . HERNIA REPAIR    . LEFT HEART CATH AND CORONARY ANGIOGRAPHY N/A 08/05/2018   Procedure: LEFT HEART CATH AND CORONARY ANGIOGRAPHY;  Surgeon: Corky Crafts, MD;  Location: [facility] INVASIVE CV LAB;  Service: Cardiovascular;  Laterality: N/A;  . NO PAST SURGERIES      Family History:  Family History  Problem Relation Age of Onset  . Heart attack Mother 23  . Heart attack Father 30    Social History:  reports that he has been smoking. He has a 35.00 pack-year smoking history. He has never used smokeless tobacco. He reports that he has current or past drug history. Drugs: Marijuana and Cocaine. He reports that he does not drink alcohol.  Additional Social History:  Alcohol / Drug Use Pain Medications: See MAR Prescriptions: See MAR Over the Counter: See MAR History of alcohol / drug use?: Yes Substance #1 Name of Substance 1: Alcohol 1 - Age of First Use: unknown 1 - Amount (size/oz): BAL 71 1 - Frequency: unknown 1 - Duration: ongoing 1 - Last Use / Amount: pt says he has not used in 2 months but labs are positive Substance #2 Name of Substance 2: cocaine 2 - Age of First Use: unknown 2 -  Amount (size/oz): varies 2 - Frequency: unknown 2 - Duration: ongoing 2 - Last Use / Amount: pt says he has not used in 2 months but labs are positive  CIWA: CIWA-Ar BP: (!) 144/98 Pulse Rate: 88 COWS:    Allergies: No Known Allergies  Home Medications:  (Not in a hospital admission)  OB/GYN Status:  No LMP for male patient.  General Assessment Data Location of Assessment: [facility] ED TTS Assessment: In system Is this a Tele or Face-to-Face Assessment?: Tele Assessment Is this an Initial Assessment or a Re-assessment for this encounter?: Initial Assessment Patient Accompanied by:: (alone) Language Other than English: No Living Arrangements: Other  (Comment)([facility]) What gender do you identify as?: Male Marital status: Single Pregnancy Status: No Living Arrangements: Group Home Can pt return to current living arrangement?: Yes Admission Status: Voluntary Is patient capable of signing voluntary admission?: Yes Referral Source: Self/Family/Friend Insurance type: Medicare     Crisis Care Plan Living Arrangements: Group Home Name of Psychiatrist: [facility] Name of Therapist: [facility]  Education Status Is patient currently in school?: No Is the patient employed, unemployed or receiving disability?: Receiving disability income  Risk to self with the past 6 months Suicidal Ideation: Yes-Currently Present Has patient been a risk to self within the past 6 months prior to admission? : No Suicidal Intent: No Has patient had any suicidal intent within the past 6 months prior to admission? : No Is patient at risk for suicide?: Yes Suicidal Plan?: Yes-Currently Present Has patient had any suicidal plan within the past 6 months prior to admission? : Yes Specify Current Suicidal Plan: pt states "a train" Access to Means: Yes Specify Access to Suicidal Means: pt has access to trains What has been your use of drugs/alcohol within the last 12 months?: pt denies however labs are positive for cocaine and alcohol  Previous Attempts/Gestures: No Triggers for Past Attempts: None known Intentional Self Injurious Behavior: None Family Suicide History: No Recent stressful life event(s): Other (Comment)(AH) Persecutory voices/beliefs?: Yes Depression: Yes Depression Symptoms: Insomnia, Feeling worthless/self pity Substance abuse history and/or treatment for substance abuse?: Yes Suicide prevention information given to non-admitted patients: Not applicable  Risk to Others within the past 6 months Homicidal Ideation: No Does patient have any lifetime risk of violence toward others beyond the six months prior to admission? : No Thoughts of Harm to  Others: No Current Homicidal Intent: No Current Homicidal Plan: No Access to Homicidal Means: No History of harm to others?: No Assessment of Violence: None Noted Does patient have access to weapons?: No Criminal Charges Pending?: No Does patient have a court date: No Is patient on probation?: No  Psychosis Hallucinations: Auditory, With command Delusions: None noted  Mental Status Report Appearance/Hygiene: In scrubs, Unremarkable Eye Contact: Good Motor Activity: Freedom of movement Speech: Logical/coherent Level of Consciousness: Alert Mood: Helpless Affect: Flat Anxiety Level: None Thought Processes: Relevant, Coherent Judgement: Impaired Orientation: Person Obsessive Compulsive Thoughts/Behaviors: None  Cognitive Functioning Concentration: Fair Memory: Remote Impaired, Recent Impaired Is patient IDD: No Insight: Poor Impulse Control: Poor Appetite: Good Have you had any weight changes? : No Change Sleep: Decreased Total Hours of Sleep: 2 Vegetative Symptoms: None  ADLScreening [facility] Assessment Services) Patient's cognitive ability adequate to safely complete daily activities?: Yes Patient able to express need for assistance with ADLs?: Yes Independently performs ADLs?: Yes (appropriate for developmental age)  Prior Inpatient Therapy Prior Inpatient Therapy: Yes Prior Therapy Dates: 2019 Prior Therapy Facilty/Provider(s): [facility] Reason for Treatment: SI, PSYCHOSIS, SA  Prior Outpatient Therapy Prior Outpatient Therapy: Yes Prior Therapy Dates: CURRENT Prior Therapy Facilty/Provider(s): [facility] Reason for Treatment: MED MANAGEMENT Does patient have an ACCT team?: No Does patient have Intensive In-House Services?  : No Does patient have [facility] services? : No Does patient have P4CC services?: No  ADL Screening (condition at time of admission) Patient's cognitive ability adequate to safely complete daily activities?: Yes Is the patient deaf or have difficulty  hearing?: No Does the patient have difficulty seeing, even when wearing glasses/contacts?: No Does the patient have difficulty concentrating, remembering, or making decisions?: No Patient able to express need for assistance with ADLs?: Yes Does the patient have difficulty dressing or bathing?: No Independently performs ADLs?: Yes (appropriate for developmental age) Does the patient have difficulty walking or climbing stairs?: No Weakness of Legs: None Weakness of Arms/Hands: None  Home Assistive Devices/Equipment Home Assistive Devices/Equipment: None    Abuse/Neglect Assessment (Assessment to be complete while patient is alone) Abuse/Neglect Assessment Can Be Completed: Yes Physical Abuse: Denies Verbal Abuse: Denies Sexual Abuse: Denies Exploitation of patient/patient's resources: Denies Self-Neglect: Denies     Merchant navy officer (For Healthcare) Does Patient Have a Medical Advance Directive?: No Would patient like information on creating a medical advance directive?: No - Patient declined          Disposition: Per Donell Sievert, PA pt is recommended for continued observation due to his initial presentation being chest pains and not SI and PA recommends pt be reassessed by psych. EDP Pollina, Canary Brim, MD and pt's nurse Wynona Canes, RN have been advised. Disposition Initial Assessment Completed for this Encounter: Yes Disposition of Patient: (OVERNIGHT OBS PENDING PSYCH ASSESSMENT) Patient refused recommended treatment: No  This service was provided via telemedicine using a 2-way, interactive audio and video technology.  Names of all persons participating in this telemedicine service and their role in this encounter. Name: Bernie Ransford Role: Patient  Name: Princess Bruins Role: TTS          Karolee Ohs 09/13/2018 6:25 AM

## 2018-09-13 NOTE — Discharge Instructions (Addendum)
Your labs, x-rays and EKG are all normal tonight. You can be discharged home. Follow up with your doctor for recheck this coming week. If you have no primary care physician, call the [facility] for an appointment.

## 2018-09-13 NOTE — BH Assessment (Signed)
[facility] Assessment Progress Note     Patient has been accepted to [facility] by Maryjean Morn, PA and can be admitted to Room 503-1 after 5 pm.  Dr Altamese [place] will be the attending physician.  Call report to [facility] Adult Unit at [phone]

## 2018-09-13 NOTE — Progress Notes (Signed)
Per Donell Sievert, PA pt is recommended for continued observation due to his initial presentation being chest pains and not SI and PA recommends pt be reassessed by psych. EDP Pollina, Canary Brim, MD and pt's nurse Wynona Canes, RN have been advised.  Princess Bruins, MSW, LCSW Therapeutic Triage Specialist  [phone]

## 2018-09-13 NOTE — ED Notes (Signed)
Pt complains of chest pain that started yesterday.

## 2018-09-13 NOTE — Tx Team (Signed)
Initial Treatment Plan 09/13/2018 8:22 PM [name] ZOX:[id number]    PATIENT STRESSORS: Medication change or noncompliance Substance abuse   PATIENT STRENGTHS: Average or above average intelligence Capable of independent living Communication skills Motivation for treatment/growth   PATIENT IDENTIFIED PROBLEMS:   "I want to get back on track"    " I need to stop taking that first drink"               DISCHARGE CRITERIA:  Improved stabilization in mood, thinking, and/or behavior Motivation to continue treatment in a less acute level of care Verbal commitment to aftercare and medication compliance  PRELIMINARY DISCHARGE PLAN: Attend aftercare/continuing care group Attend 12-step recovery group Return to previous living arrangement  PATIENT/FAMILY INVOLVEMENT: This treatment plan has been presented to and reviewed with the patient, [name] [name], [name] [name] patient has been given the opportunity to ask questions and make suggestions.  Shela Nevin, RN 09/13/2018, 8:22 PM

## 2018-09-13 NOTE — ED Notes (Signed)
Telepsych completed.  

## 2018-09-13 NOTE — ED Notes (Signed)
Pt noted to be labile. Refuses to answer is he is SI/HI. States "I just woke up and I don't know what I'm feeling right now. I just need to use the phone to call my house and let them know where I am". Pt asking for RN to retrieve the number from his pants pocket - given to pt. Pt then states he needs to get help w/detox and wants to be given resources. Pt called Winferd Humphrey w/"[facility]" and advised him he is in the hospital.

## 2018-09-14 DIAGNOSIS — F1290 Cannabis use, unspecified, uncomplicated: Secondary | ICD-10-CM

## 2018-09-14 DIAGNOSIS — F14959 Cocaine use, unspecified with cocaine-induced psychotic disorder, unspecified: Secondary | ICD-10-CM

## 2018-09-14 MED ORDER — GABAPENTIN 100 MG PO CAPS
100.0000 mg | ORAL_CAPSULE | Freq: Three times a day (TID) | ORAL | Status: DC
  Filled 2018-09-14 (×2): qty 1

## 2018-09-14 MED ORDER — GABAPENTIN 300 MG PO CAPS
600.0000 mg | ORAL_CAPSULE | Freq: Three times a day (TID) | ORAL | Status: DC
  Administered 2018-09-14 – 2018-09-16 (×6): 600 mg via ORAL
  Filled 2018-09-14 (×11): qty 2

## 2018-09-14 NOTE — [facility] Group Notes (Signed)
[facility] LCSW Group Therapy Note  Date/Time:  09/14/2018  11:00AM-12:00PM  Type of Therapy and Topic:  Group Therapy:  Music and Mood  Participation Level:  Did Not Attend   Description of Group: In this process group, members listened to a variety of genres of music and identified that different types of music evoke different responses.  Patients were encouraged to identify music that was soothing for them and music that was energizing for them.  Patients discussed how this knowledge can help with wellness and recovery in various ways including managing depression and anxiety as well as encouraging healthy sleep habits.    Therapeutic Goals: 1. Patients will explore the impact of different varieties of music on mood 2. Patients will verbalize the thoughts they have when listening to different types of music 3. Patients will identify music that is soothing to them as well as music that is energizing to them 4. Patients will discuss how to use this knowledge to assist in maintaining wellness and recovery 5. Patients will explore the use of music as a coping skill  Summary of Patient Progress:  N/A  Therapeutic Modalities: Solution Focused Brief Therapy Activity   Torre Schaumburg Grossman-Orr, LCSW    

## 2018-09-14 NOTE — Progress Notes (Signed)
D. Pt presents with a flat affect/ sad mood, but brightens during interactions- is friendly, calm and cooperative- Per pt's self inventory, pt rates his depression, hopelessness and anxiety an 8/8/3, respectively. Pt complains of right knee pain, 9/10 . Pt writes that his most important goal today is "talk with someone about treatment plan" and writes that he will "follow directions" to help him meet that goal. Pt currently denies SI/HI and AVH and agrees to contact staff before acting on any harmful thoughts.  A. Labs and vitals monitored. Pt provided with cold pack and prn tylenol for R knee pain. Pt compliant with medications. Pt supported emotionally and encouraged to express concerns and ask questions.   R. Pt remains safe with 15 minute checks. Will continue POC.

## 2018-09-14 NOTE — [facility] Group Notes (Signed)
[facility] Group Notes:  (Nursing)  Date:  09/14/2018  Time: 945 Type of Therapy:  Nurse Education  Participation Level:  Did Not Attend    Summary of Progress/Problems: Pt sleeping- resting because of R knee pain 9/10  Shela Nevin 09/14/2018, 10:14 AM

## 2018-09-14 NOTE — Plan of Care (Signed)
D:Patient has been observed in his room most of night. Patient is passive SI; however he does contract for safety. Patient has a flat affect. Patient is calm and cooperative. Patient states he has self harm thoughts.  A:Patient provided support and encouragement. Q 15 minute checks in progress and patient remains safe on unit. Patient adjusting to unit well. Patient encouraged to attend groups. Medications administered as prescribed. R:Patient is taking medications. Patient remains safe on unit. Patient contracts for safety.    Problem: Activity: Goal: Sleeping patterns will improve Outcome: Progressing   Problem: Safety: Goal: Periods of time without injury will increase Outcome: Progressing

## 2018-09-14 NOTE — [facility] Counselor (Signed)
Clinical Social Work Note  Patient agreed to do Psychosocial Assessment, but when he was asked the first few questions became irritable and stated he was "not prepared to answer these questions on a Sunday."  CSW offered to delay until Monday, and he stated this was his preference.  CSW team to follow up.  Ambrose Mantle, LCSW 09/14/2018, 3:25 PM

## 2018-09-14 NOTE — H&P (Addendum)
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 Chest pain [R07.9] 08/05/2018  . Ischemic dilated cardiomyopathy ([place]) [I25.5, I42.0] 08/04/2018  . Cocaine abuse ([place]) [F14.10] 04/30/2018  . Mild tetrahydrocannabinol (THC) abuse [F12.10] 04/30/2018  . Moderate episode of recurrent major depressive disorder ([place]) [F33.1] 04/30/2018  . Unstable angina ([place]) [I20.0] 06/13/2016  . Benign essential HTN [I10] 06/13/2016  . Smoking [F17.200] 06/13/2016  . Family history of heart attack [Z82.49]   . Schizoaffective disorder, depressive type ([place]) [F25.1] 09/08/2015  . Acute alcoholic intoxication in alcoholism with complication ([place]) [W23.762] 04/16/2015  . 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[place]) [G31.517] 04/15/2015  . Polysubstance dependence [facility]) [F19.20] 04/13/2015   History of Present Illness: Per assessment note: Jovan Colligan is an 57 y.o. male who presents to the ED voluntarily. Pt initially presented to the ED due to having chest pains. Pt states while he was being d/c, he told ED staff that he was suicidal. TTS asked the pt if he had a plan to kill himself and he stated "a train." Pt states he is suicidal due to the voices in his head. Pt states "I just get tired of this." Pt states he has not been taking his medication because he forgets to take it. Pt states he lives in the [facility] and his medication is at the house. Pt states he has not taken the medication for several days. Pt denies HI. TTS asked the pt to identify his current or past SA hx and pt states he has not used any substance for 2 months. Pt's labs are positive for cocaine and his BAL is 71 on arrival  to ED. TTS asked the pt how the substances got into his system and he stated "I don't remember." Pt then asks this writer "am I in [employer]? They usually put me on the 5th floor. Am I going to [facility] [place]? They told me I was going to [employer]. Where am I?" Pt appears preoccupied during the assessment and minimizing his drug use. Pt is unable to contract for safety at this time.    Evaluation: Petro Talent 57 year old African-American divorced male presents suicidal ideations due to recent relapse.  patient is awake alert and oriented x3.  Continues to report mild auditory hallucinations.  Patient was initiated on Seroquel prior to admission.  Reports taking and tolerating medications well.  Patient denies previous suicidal attempts.  Patient reports he is hopeful to receive additional resources for residential treatment facility as he reports a relapse as he states he was 60 days clean.  Patient reports he is hopeful to discharge back to the [facility] however states that he was using cocaine and alcohol and is unsure if he will be accepted back.  Patient continues to deny suicidal or homicidal ideations during this assessment.  Reports auditory hallucinations.  Patient reports previous inpatient admissions in [facility].  Support encouragement reassurance was provided  Associated Signs/Symptoms: Depression Symptoms:  depressed mood, difficulty concentrating, suicidal thoughts with specific plan, anxiety, (Hypo) Manic Symptoms:  Distractibility, Anxiety Symptoms:  Excessive Worry, Psychotic Symptoms:  Hallucinations: Auditory PTSD Symptoms: NA Total Time spent with patient: 15 minutes  Past Psychiatric History: Reports previous inpatient admissions [facility] and [place]. Reports she is followed  by a primary care provider for medication management.  Reports substance abuse cocaine and alcohol.  Is the patient at risk to self? Yes.    Has the patient been a risk to self  in the past 6 months? Yes.    Has the patient been a risk to self within the distant past? No.  Is the patient a risk to others? No.  Has the patient been a risk to others in the past 6 months? No.  Has the patient been a risk to others within the distant past? No.   Prior Inpatient Therapy:   Prior Outpatient Therapy:    Alcohol Screening:   Substance Abuse History in the last 12 months:  Yes.   Consequences of Substance Abuse: NA Previous Psychotropic Medications: Yes  Psychological Evaluations: Yes  Past Medical History:  Past Medical History:  Diagnosis Date  . Arthritis   . Bipolar 1 disorder ([place])   . Depression   . Hypertension   . Schizophrenia [facility])     Past Surgical History:  Procedure Laterality Date  . CHOLECYSTECTOMY N/A 12/14/2014   Procedure: LAPAROSCOPIC CHOLECYSTECTOMY WITH INTRAOPERATIVE CHOLANGIOGRAM;  Surgeon: Erroll Luna, MD;  Location: [place];  Service: General;  Laterality: N/A;  . HERNIA REPAIR    . LEFT HEART CATH AND CORONARY ANGIOGRAPHY N/A 08/05/2018   Procedure: LEFT HEART CATH AND CORONARY ANGIOGRAPHY;  Surgeon: Jettie Booze, MD;  Location: [facility];  Service: Cardiovascular;  Laterality: N/A;  . NO PAST SURGERIES     Family History:  Family History  Problem Relation Age of Onset  . Heart attack Mother 59  . Heart attack Father 22   Family Psychiatric  History: was denied Tobacco Screening:   Social History:  Social History   Substance and Sexual Activity  Alcohol Use No     Social History   Substance and Sexual Activity  Drug Use Yes  . Types: Marijuana, Cocaine    Additional Social History:                           Allergies:  No Known Allergies Lab Results:  Results for orders placed or performed during the hospital encounter of 09/13/18 (from the past 48 hour(s))  Rapid urine drug screen (hospital performed)     Status: Abnormal   Collection Time: 09/13/18  3:34 AM  Result Value Ref Range    Opiates NONE DETECTED NONE DETECTED   Cocaine POSITIVE (A) NONE DETECTED   Benzodiazepines NONE DETECTED NONE DETECTED   Amphetamines NONE DETECTED NONE DETECTED   Tetrahydrocannabinol NONE DETECTED NONE DETECTED   Barbiturates NONE DETECTED NONE DETECTED    Comment: (NOTE) DRUG SCREEN FOR MEDICAL PURPOSES ONLY.  IF CONFIRMATION IS NEEDED FOR ANY PURPOSE, NOTIFY LAB WITHIN 5 DAYS. LOWEST DETECTABLE LIMITS FOR URINE DRUG SCREEN Drug Class                     Cutoff (ng/mL) Amphetamine and metabolites    1000 Barbiturate and metabolites    200 Benzodiazepine                 951 Tricyclics and metabolites     300 Opiates and metabolites        300 Cocaine and metabolites        300 THC  50 Performed at [facility], [place] [address], [place], [place] [address]   Comprehensive metabolic panel     Status: Abnormal   Collection Time: 09/13/18  3:46 AM  Result Value Ref Range   Sodium 138 135 - 145 mmol/L   Potassium 4.0 3.5 - 5.1 mmol/L   Chloride 109 98 - 111 mmol/L   CO2 20 (L) 22 - 32 mmol/L   Glucose, Bld 73 70 - 99 mg/dL   BUN 10 6 - 20 mg/dL   Creatinine, Ser 0.91 0.61 - 1.24 mg/dL   Calcium 9.4 8.9 - 10.3 mg/dL   Total Protein 7.7 6.5 - 8.1 g/dL   Albumin 4.4 3.5 - 5.0 g/dL   AST 21 15 - 41 U/L   ALT 17 0 - 44 U/L   Alkaline Phosphatase 80 38 - 126 U/L   Total Bilirubin 0.6 0.3 - 1.2 mg/dL   GFR calc non Af Amer >60 >60 mL/min   GFR calc Af Amer >60 >60 mL/min    Comment: (NOTE) The eGFR has been calculated using the CKD EPI equation. This calculation has not been validated in all clinical situations. eGFR's persistently <60 mL/min signify possible Chronic Kidney Disease.    Anion gap 9 5 - 15    Comment: Performed at [facility] [address], [place], [place] [address]  Ethanol     Status: Abnormal   Collection Time: 09/13/18  3:46 AM  Result Value Ref Range   Alcohol, Ethyl (B) 71 (H) <10 mg/dL    Comment:  (NOTE) Lowest detectable limit for serum alcohol is 10 mg/dL. For medical purposes only. Performed at [facility], [place] [address] [place], [place] [address]   Salicylate level     Status: None   Collection Time: 09/13/18  3:46 AM  Result Value Ref Range   Salicylate Lvl <2.7 2.8 - 30.0 mg/dL    Comment: Performed at [facility] [address], [place], [place] [address]  Acetaminophen level     Status: Abnormal   Collection Time: 09/13/18  3:46 AM  Result Value Ref Range   Acetaminophen (Tylenol), Serum <10 (L) 10 - 30 ug/mL    Comment: (NOTE) Therapeutic concentrations vary significantly. A range of 10-30 ug/mL  may be an effective concentration for many patients. However, some  are best treated at concentrations outside of this range. Acetaminophen concentrations >150 ug/mL at 4 hours after ingestion  and >50 ug/mL at 12 hours after ingestion are often associated with  toxic reactions. Performed at [facility], [place] [address], [place] [address]   cbc     Status: None   Collection Time: 09/13/18  3:46 AM  Result Value Ref Range   WBC 6.7 4.0 - 10.5 K/uL   RBC 5.08 4.22 - 5.81 MIL/uL   Hemoglobin 13.5 13.0 - 17.0 g/dL   HCT 43.6 39.0 - 52.0 %   MCV 85.8 80.0 - 100.0 fL   MCH 26.6 26.0 - 34.0 pg   MCHC 31.0 30.0 - 36.0 g/dL   RDW 13.9 11.5 - 15.5 %   Platelets 355 150 - 400 K/uL   nRBC 0.0 0.0 - 0.2 %    Comment: Performed at [facility], [place] [address], [place], [place] [address]    Blood Alcohol level:  Lab Results  Component Value Date   ETH 71 (H) 09/13/2018   ETH 256 (H) 91/63/8466    Metabolic  Disorder Labs:  Lab Results  Component Value Date   HGBA1C 5.7 (H) 06/13/2016   MPG 117 06/13/2016   No results found for: PROLACTIN Lab Results  Component Value Date   CHOL 179 06/13/2016   TRIG 52 06/13/2016   HDL 47 06/13/2016   CHOLHDL 3.8 06/13/2016   VLDL 10 06/13/2016   LDLCALC 122 (H) 06/13/2016    Current  Medications: Current Facility-Administered Medications  Medication Dose Route Frequency Provider Last Rate Last Dose  . acetaminophen (TYLENOL) tablet 650 mg  650 mg Oral Q6H PRN Lindon Romp A, NP   650 mg at 09/14/18 0825  . alum & mag hydroxide-simeth (MAALOX/MYLANTA) 200-200-20 MG/5ML suspension 30 mL  30 mL Oral Q4H PRN Lindon Romp A, NP      . amLODipine (NORVASC) tablet 5 mg  5 mg Oral Daily Lindon Romp A, NP   5 mg at 09/14/18 0811  . atorvastatin (LIPITOR) tablet 20 mg  20 mg Oral QHS Lindon Romp A, NP   20 mg at 09/13/18 2232  . clopidogrel (PLAVIX) tablet 75 mg  75 mg Oral Daily Lindon Romp A, NP   75 mg at 09/14/18 0814  . FLUoxetine (PROZAC) capsule 10 mg  10 mg Oral Daily Lindon Romp A, NP   10 mg at 09/14/18 0813  . gabapentin (NEURONTIN) tablet 600 mg  600 mg Oral TID Lindon Romp A, NP   600 mg at 09/14/18 0813  . hydrOXYzine (ATARAX/VISTARIL) tablet 25 mg  25 mg Oral TID PRN Rozetta Nunnery, NP      . Influenza vac split quadrivalent PF (FLUARIX) injection 0.5 mL  0.5 mL Intramuscular Tomorrow-1000 Darlyne Russian E, PA-C      . magnesium hydroxide (MILK OF MAGNESIA) suspension 30 mL  30 mL Oral Daily PRN Lindon Romp A, NP      . pneumococcal 23 valent vaccine (PNU-IMMUNE) injection 0.5 mL  0.5 mL Intramuscular Tomorrow-1000 Dara Hoyer, PA-C      . QUEtiapine (SEROQUEL) tablet 100 mg  100 mg Oral Daily Lindon Romp A, NP   100 mg at 09/14/18 0813  . traZODone (DESYREL) tablet 50 mg  50 mg Oral QHS PRN Rozetta Nunnery, NP   50 mg at 09/13/18 2232   PTA Medications: Medications Prior to Admission  Medication Sig Dispense Refill Last Dose  . amLODipine (NORVASC) 5 MG tablet Take 1 tablet (5 mg total) by mouth daily. 30 tablet 3 Past Week at Unknown time  . aspirin EC 81 MG EC tablet Take 1 tablet (81 mg total) by mouth daily. (Patient not taking: Reported on 06/17/2018) 30 tablet 3 Not Taking at Unknown time  . atorvastatin (LIPITOR) 20 MG tablet Take 1 tablet (20 mg  total) by mouth at bedtime. 30 tablet 3 Past Week at Unknown time  . clopidogrel (PLAVIX) 75 MG tablet Take 1 tablet (75 mg total) by mouth daily. 30 tablet 0 Past Week at Unknown time  . FLUoxetine (PROZAC) 10 MG capsule Take 10 mg by mouth daily.  0 Past Week at Unknown time  . gabapentin (NEURONTIN) 600 MG tablet Take 600 mg by mouth 3 (three) times daily.    Past Week at Unknown time  . QUEtiapine (SEROQUEL) 100 MG tablet Take 1 tablet (100 mg total) by mouth daily. 30 tablet 0 Past Week at Unknown time    Musculoskeletal: Strength & Muscle Tone: within normal limits Gait & Station: normal Patient leans: N/A  Psychiatric Specialty Exam: Physical Exam  Nursing note and vitals reviewed. Constitutional: He appears well-developed.  Psychiatric: He has a normal mood and affect. His behavior is normal.    Review of Systems  Psychiatric/Behavioral: Positive for depression, hallucinations and substance abuse. Negative for suicidal ideas. The patient is nervous/anxious.   All other systems reviewed and are negative.   Blood pressure (!) 147/82, pulse 84, temperature 98.4 F (36.9 C), temperature source Oral, resp. rate 20, height '5\' 6"'  (1.676 m), weight 76.2 kg, SpO2 100 %.Body mass index is 27.12 kg/m.  General Appearance: Casual  Eye Contact:  Fair  Speech:  Clear and Coherent  Volume:  Normal  Mood:  Anxious  Affect:  Appropriate and Congruent  Thought Process:  Coherent  Orientation:  Full (Time, Place, and Person)  Thought Content:  Hallucinations: Auditory  Suicidal Thoughts:  Yes.  without intent/plan patient is able to contact for safety while on the unit.  Currently denying intent or plan during this assessment  Homicidal Thoughts:  No  Memory:  Immediate;   Fair Recent;   Fair  Judgement:  Fair  Insight:  Fair  Psychomotor Activity:  Normal  Concentration:  Concentration: Fair  Recall:  [employer] of Knowledge:  Fair  Language:  Fair  Akathisia:  No  Handed:  Right   AIMS (if indicated):     Assets:  Communication Skills Desire for Improvement Resilience Social Support Talents/Skills  ADL's:  Intact  Cognition:  WNL  Sleep:  Number of Hours: 7.5    Treatment Plan Summary: Daily contact with patient to assess and evaluate symptoms and progress in treatment and Medication management   Mood stabilization:  Continue Seroquel 100 mg p.o. Nightly  Continue Prozac 10 mg p.o. Daily Insomnia:  Continue trazodone 50 mg p.o. 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[place]) [place] Term Goal(s): Improvement in symptoms so as ready for discharge  Short Term Goals: Ability to verbalize feelings will improve, Ability to identify and develop effective coping behaviors will improve, Compliance with prescribed medications will improve and Ability to identify triggers associated with substance abuse/mental health issues will improve  Physician Treatment Plan for Secondary Diagnosis: Principal Problem:   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    Derrill Center, NP 11/3/201910:42 AM   I have discussed case with NP and have met with patient  Agree with NP note and assessment  57 year old , divorced, has an adult daughter, lives alone .  Presented to the ED via EMS after experiencing dizziness while standing in line at a fast food restaurant. Was discharged but returned several hours  later reporting auditory, visual hallucinations, depression, suicidal ideations of jumping in front of a train, and cocaine abuse . States he hears voice saying " it's all right, go ahead and get high". States he sometimes hears voice telling him to jump in front of a train. Regarding substance abuse, reports being  sober for about 5 -6 months until he relapsed about a week ago. Denies alcohol or BZD Abuse . States he has been off psychiatric medications for several months, and off his other medications x 1-2 weeks.  Reports history of prior psychiatric admissions for depression, hallucinations, and states that he has been diagnosed with schizophrenia in the past.  States he attempted suicide in the past by overdosing .   Medical History - History of CAD, Cardiomyopathy, HTN  Dx- Cocaine Use Disorder, Cocaine Induced Psychosis, Cocaine Induced Mood Disorder versus MDD with Psychotic features .  Plan- Inpatient admission.   He is restarted on medications Seroquel 100 mgrs QHS Prozac 10 mgrs QDAY Neurontin ( restart at 100 mgrs TID) Resume Plavix, Norvasc, Lipitor

## 2018-09-14 NOTE — Progress Notes (Signed)
Writer spoke with patient 1:1 and he reports  having had a better day today. He attended group and participated. He reports that he does good for a while and by living alone he will tell himself that its okay to have one drink and then he ends up having more than one drink. Writer asked if he has an Clinical research associate and he said that he died. Writer encouraged him to get another sponsor so he will have someone to talk to when he has these urges. Support given and safety maintained on unit with 15 min checks.

## 2018-09-14 NOTE — Progress Notes (Addendum)
Patient  restarted on 600 mg gabapentin, 3 x day per verbal order- Pt has been receiving 600 mg  3 x day in ED and has been taking this dosage since yesterday and has been tolerating well. Pt states that he has been taking this dosage for right knee pain (injury) x approx 10 years

## 2018-09-14 NOTE — BHH Suicide Risk Assessment (Signed)
[facility] Admission Suicide Risk Assessment   Nursing information obtained from:  Patient Demographic factors:  Male, Unemployed, Divorced or widowed Current Mental Status:  NA Loss Factors:  Decline in physical health, Loss of significant relationship Historical Factors:  Prior suicide attempts Risk Reduction Factors:  Religious beliefs about death  Total Time spent with patient: 45 minutes Principal Problem: Cocaine-induced psychotic disorder ([facility]) Diagnosis:   Patient Active Problem List   Diagnosis Date Noted  . 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 Chest pain [R07.9] 08/05/2018  . Ischemic dilated cardiomyopathy ([facility]) [I25.5, I42.0] 08/04/2018  . Cocaine abuse ([facility]) [F14.10] 04/30/2018  . Mild tetrahydrocannabinol (THC) abuse [F12.10] 04/30/2018  . Moderate episode of recurrent major depressive disorder ([facility]) [F33.1] 04/30/2018  . Unstable angina ([facility]) [I20.0] 06/13/2016  . Benign essential HTN [I10] 06/13/2016  . Smoking [F17.200] 06/13/2016  . Family history of heart attack [Z82.49]   . Schizoaffective disorder, depressive type (HCC) [F25.1] 09/08/2015  . Acute alcoholic intoxication in alcoholism with complication (H[facility]) [F10.229] 04/16/2015  . 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[facility]) [M57.846] 04/15/2015  . Polysubstance dependence (HCC) [F19.20] 04/13/2015   Subjective Data:   Continued Clinical Symptoms:    The "Alcohol Use Disorders Identification Test", Guidelines for Use in Primary Care, Second Edition.  World Science writer [facility]). Score between 0-7:  no or low risk or alcohol related problems. Score between 8-15:  moderate risk of alcohol related problems. Score between 16-19:  high risk of alcohol related problems. Score 20 or above:  warrants further diagnostic evaluation for alcohol dependence and treatment.   CLINICAL FACTORS:  57 year old , divorced, has an adult daughter, lives alone .  Presented to the ED  via EMS after experiencing dizziness while standing in line at a fast food restaurant. Was discharged but returned several hours later reporting auditory, visual hallucinations, depression, suicidal ideations of jumping in front of a train, and cocaine abuse . States he hears voice saying " it's all right, go ahead and get high". States he sometimes hears voice telling him to jump in front of a train. Regarding substance abuse, reports being  sober for about 5 -6 months until he relapsed about a week ago. Denies alcohol or BZD Abuse . States he has been off psychiatric medications for several months, and off his other medications x 1-2 weeks.  Reports history of prior psychiatric admissions for depression, hallucinations, and states that he has been diagnosed with schizophrenia in the past.  States he attempted suicide in the past by overdosing .   Medical History - History of CAD, Cardiomyopathy, HTN  Dx- Cocaine Use Disorder, Cocaine Induced Psychosis, Cocaine Induced Mood Disorder versus MDD with Psychotic features .  Plan- Inpatient admission.   He is restarted on medications Seroquel 100 mgrs QHS Prozac 10 mgrs QDAY Neurontin ( restart at 100 mgrs TID) Resume Plavix, Norvasc, Lipitor               Musculoskeletal: Strength & Muscle Tone: within normal limits Gait & Station: normal Patient leans: N/A  Psychiatric Specialty Exam: Physical Exam  ROS denies current chest pain, no shortness of breath, no vomiting   Blood pressure (!) 147/82, pulse 84, temperature 98.4 F (36.9 C), temperature source Oral, resp. rate 20, height 5\' 6"  (1.676 m), weight 76.2 kg, SpO2 100 %.Body mass index is 27.12 kg/m.  General Appearance: Fairly Groomed  Eye Contact:  Fair  Speech:  Normal Rate  Volume:  Normal  Mood:  vaguely depressed,  dysphoric   Affect:  congruent, affect is somewhat reactive and smiles at times appropriately   Thought Process:  Linear and Descriptions of Associations:  Intact  Orientation:  Other:  fully alert and attentive   Thought Content:  describes auditory hallucinations, not internally preoccupied at this time, no delusions expressed   Suicidal Thoughts:  No denies current suicidal or self injurious or homicidal or violent ideations   Homicidal Thoughts:  No  Memory:  recent and remote grossly intact   Judgement:  Fair  Insight:  Fair  Psychomotor Activity:  Decreased  Concentration:  Concentration: Good and Attention Span: Good  Recall:  Good  Fund of Knowledge:  Good  Language:  Good  Akathisia:  Negative  Handed:  Right  AIMS (if indicated):     Assets:  Desire for Improvement Resilience  ADL's:  Intact  Cognition:  WNL  Sleep:  Number of Hours: 7.5      COGNITIVE FEATURES THAT CONTRIBUTE TO RISK:  Closed-mindedness and Loss of executive function    SUICIDE RISK:   Moderate:  Frequent suicidal ideation with limited intensity, and duration, some specificity in terms of plans, no associated intent, good self-control, limited dysphoria/symptomatology, some risk factors present, and identifiable protective factors, including available and accessible social support.  PLAN OF CARE: Patient will be admitted to inpatient psychiatric unit for stabilization and safety. Will provide and encourage milieu participation. Provide medication management and maked adjustments as needed.  Will follow daily.    I certify that inpatient services furnished can reasonably be expected to improve the patient's condition.   Craige Cotta, MD 09/14/2018, 3:57 PM

## 2018-09-15 LAB — LIPID PANEL
Cholesterol: 132 mg/dL (ref 0–200)
HDL: 43 mg/dL (ref >40)
LDL CALC: 79 mg/dL (ref 0–99)
Total CHOL/HDL Ratio: 3.1 RATIO
Triglycerides: 51 mg/dL (ref <150)
VLDL: 10 mg/dL (ref 0–40)

## 2018-09-15 LAB — HEMOGLOBIN A1C
HEMOGLOBIN A1C: 5 % (ref 4.8–5.6)
MEAN PLASMA GLUCOSE: 96.8 mg/dL

## 2018-09-15 LAB — TSH: TSH: 0.574 u[IU]/mL (ref 0.350–4.500)

## 2018-09-15 MED ORDER — QUETIAPINE FUMARATE 100 MG PO TABS
100.0000 mg | ORAL_TABLET | Freq: Three times a day (TID) | ORAL | Status: DC
  Administered 2018-09-15 – 2018-09-16 (×4): 100 mg via ORAL
  Filled 2018-09-15 (×9): qty 1

## 2018-09-15 MED ORDER — NICOTINE 21 MG/24HR TD PT24
21.0000 mg | MEDICATED_PATCH | Freq: Every day | TRANSDERMAL | Status: DC
  Administered 2018-09-16: 21 mg via TRANSDERMAL
  Filled 2018-09-15 (×3): qty 1

## 2018-09-15 NOTE — BHH Counselor (Signed)
CSW attempted to complete assessment. Pt in room sleeping. Difficult to rouse, declining to participate in assessment at this time. CSW will attempt in the morning.   Bo Rogue S. Alan Ripper, MSW, LCSW Clinical Social Worker 09/15/2018 2:09 PM

## 2018-09-15 NOTE — Progress Notes (Signed)
Recreation Therapy Notes  Date: 11.4.19 Time: 1000 Location: 500 Hall Dayroom  Group Topic: Coping Skills  Goal Area(s) Addresses:  Patient will be able to identify positive coping skills. Patient will identify benefits of using coping skills post d/c.  Intervention: Worksheet  Activity: Mind map.  LRT and patients filled in the first 8 boxes of the mind map together (anxiety, depression, exhaustion, mood, frustration, stress, work and anger).  Individually, patients would come up with 3 coping skills for each situation.  The group would comeback together and LRT would fill in the coping skills on the board.  Education: Pharmacologist, Building control surveyor.   Education Outcome: Acknowledges understanding/In group clarification offered/Needs additional education.   Clinical Observations/Feedback: Pt did not attend group.     [name], LRT/CTRS         [name] A 09/15/2018 11:45 AM

## 2018-09-15 NOTE — Progress Notes (Signed)
Recreation Therapy Notes  INPATIENT RECREATION THERAPY ASSESSMENT  Patient Details Name: [name] [name] MRN: [id number] DOB: 02/27/1961 Today's Date: 09/15/2018       Information Obtained From: Patient  Able to Participate in Assessment/Interview: Yes  Patient Presentation: Alert  Reason for Admission (Per Patient): Other (Comments)(Depression)  Patient Stressors: Other (Comment)(Pt stated he didn't want to talk about it)  Coping Skills:   Isolation, Self-Injury, Sports, Aggression, Exercise, Prayer, Avoidance  Leisure Interests (2+):  [employer], Individual - Other (Comment)(Martial Arts)  Frequency of Recreation/Participation: Other (Comment)(Fishing- during summer; Martial Arts- Daily)  Awareness of Community Resources:  Yes  Community Resources:  Park, Engineering geologist  Current Use: No  If no, Barriers?: Other (Comment)(No reason given)  Expressed Interest in [employer] Information: No  [place] Residence[place]  Patient Main Form of Transportation: Set designer  Patient Strengths:  Believe in God; Believe in myself  Patient Identified Areas of Improvement:  Coming home  Patient Goal for Hospitalization:  "To get back on track, get self together, stop hallucinations"  Current SI (including self-harm):  Yes(Rated an 8; contracts for safety)  Current HI:  No  Current AVH: Yes(Pt stated it comes and goes.)  Staff Intervention Plan: Group Attendance, Collaborate with Interdisciplinary Treatment Team  Consent to Intern Participation: N/A    [name], LRT/CTRS  [name], Stanton Kissoon A 09/15/2018, 1:46 PM

## 2018-09-15 NOTE — Progress Notes (Signed)
At the beginning of the shift, pt was in his room in bed and appeared to be asleep.  He was easily awakened when Clinical research associate called his name.  He was a little confused at first when awakened, but fine afterwards when he realized that it was early in the evening.  He denies SI/HI/AVH.  He asked about snack and was told that snack would be given after a wrap up group.  Writer also discussed meds with pt at that time.  Pt voiced understanding and voiced no concerns.  He declined taking a sleep aid tonight.  Pt went to bed around 2200.  Support and encouragement offered.  Discharge plans are in process.  Pt safe with q15 minute checks.  Pt up around 2243 stating he could not stay asleep d/t persistent coughing.  Pt was given cough drops with menthol and also a cold drink.  Pt returned to bed.  Will continue to monitor for further symptoms.

## 2018-09-15 NOTE — Progress Notes (Signed)
[facility] MD Progress Note  09/15/2018 10:50 AM [name] [name]  MRN:  [id number] Subjective:  Pt states he is in boarding house- needs to pay to stay there Denies current a- v hall No si  Principal Problem: Cocaine-induced psychotic disorder (HCC) Diagnosis:   Patient Active Problem List   Diagnosis Date Noted  . 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 Chest pain [R07.9] 08/05/2018  . Ischemic dilated cardiomyopathy (HCC) [I25.5, I42.0] 08/04/2018  . Cocaine abuse (HCC) [F14.10] 04/30/2018  . Mild tetrahydrocannabinol (THC) abuse [F12.10] 04/30/2018  . Moderate episode of recurrent major depressive disorder (HCC) [F33.1] 04/30/2018  . Unstable angina (HCC) [I20.0] 06/13/2016  . Benign essential HTN [I10] 06/13/2016  . Smoking [F17.200] 06/13/2016  . Family history of heart attack [Z82.49]   . Schizoaffective disorder, depressive type (HCC) [F25.1] 09/08/2015  . Acute alcoholic intoxication in alcoholism with complication (HCC) [F10.229] 04/16/2015  . 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HCC) [W09.811] 04/15/2015  . Polysubstance dependence (HCC) [F19.20] 04/13/2015   Total Time spent with patient: 15 minutes  Past Psychiatric History: extensive h/o malingering issues of housing and chronic substance abuse  Past Medical History:  Past Medical History:  Diagnosis Date  . Arthritis   . Bipolar 1 disorder (HCC)   . Depression   . Hypertension   . Schizophrenia [facility])     Past Surgical History:  Procedure Laterality Date  . CHOLECYSTECTOMY N/A 12/14/2014   Procedure: LAPAROSCOPIC CHOLECYSTECTOMY WITH INTRAOPERATIVE CHOLANGIOGRAM;  Surgeon: Harriette Bouillon, MD;  Location: [facility] OR;  Service: General;  Laterality: N/A;  . HERNIA REPAIR    . LEFT HEART CATH AND CORONARY ANGIOGRAPHY N/A 08/05/2018   Procedure: LEFT HEART CATH AND CORONARY ANGIOGRAPHY;  Surgeon: Corky Crafts, MD;  Location: [facility] INVASIVE CV LAB;  Service: Cardiovascular;   Laterality: N/A;  . NO PAST SURGERIES     Family History:  Family History  Problem Relation Age of Onset  . Heart attack Mother 36  . Heart attack Father 29   Family Psychiatric  History: no new info Social History:  Social History   Substance and Sexual Activity  Alcohol Use No     Social History   Substance and Sexual Activity  Drug Use Yes  . Types: Marijuana, Cocaine    Social History   Socioeconomic History  . Marital status: Single    Spouse name: Not on file  . Number of children: Not on file  . Years of education: Not on file  . Highest education level: Not on file  Occupational History  . Not on file  Social Needs  . Financial resource strain: Not on file  . Food insecurity:    Worry: Not on file    Inability: Not on file  . Transportation needs:    Medical: Not on file    Non-medical: Not on file  Tobacco Use  . Smoking status: Current Every Day Smoker    Packs/day: 1.00    Years: 35.00    Pack years: 35.00  . Smokeless tobacco: Never Used  Substance and Sexual Activity  . Alcohol use: No  . Drug use: Yes    Types: Marijuana, Cocaine  . Sexual activity: Not on file  Lifestyle  . Physical activity:    Days per week: Not on file    Minutes per session: Not on file  . Stress: Not on file  Relationships  . Social connections:    Talks on phone: Not on file    Gets together:  Not on file    Attends religious service: Not on file    Active member of club or organization: Not on file    Attends meetings of clubs or organizations: Not on file    Relationship status: Not on file  Other Topics Concern  . Not on file  Social History Narrative  . Not on file   Additional Social History: states he has housing if pays                   Sleep: Good  Appetite:  Good  Current Medications: Current Facility-Administered Medications  Medication Dose Route Frequency Provider Last Rate Last Dose  . acetaminophen (TYLENOL) tablet 650 mg  650 mg  Oral Q6H PRN Nira Conn A, NP   650 mg at 09/14/18 1713  . alum & mag hydroxide-simeth (MAALOX/MYLANTA) 200-200-20 MG/5ML suspension 30 mL  30 mL Oral Q4H PRN Nira Conn A, NP      . amLODipine (NORVASC) tablet 5 mg  5 mg Oral Daily Nira Conn A, NP   5 mg at 09/15/18 0825  . atorvastatin (LIPITOR) tablet 20 mg  20 mg Oral QHS Nira Conn A, NP   20 mg at 09/14/18 2053  . clopidogrel (PLAVIX) tablet 75 mg  75 mg Oral Daily Nira Conn A, NP   75 mg at 09/15/18 0825  . FLUoxetine (PROZAC) capsule 10 mg  10 mg Oral Daily Nira Conn A, NP   10 mg at 09/15/18 0825  . gabapentin (NEURONTIN) capsule 600 mg  600 mg Oral TID Cobos, Rockey Situ, MD   600 mg at 09/15/18 1610  . hydrOXYzine (ATARAX/VISTARIL) tablet 25 mg  25 mg Oral TID PRN Jackelyn Poling, NP      . magnesium hydroxide (MILK OF MAGNESIA) suspension 30 mL  30 mL Oral Daily PRN Nira Conn A, NP      . QUEtiapine (SEROQUEL) tablet 100 mg  100 mg Oral TID Malvin Johns, MD      . traZODone (DESYREL) tablet 50 mg  50 mg Oral QHS PRN Jackelyn Poling, NP   50 mg at 09/14/18 2053    Lab Results:  Results for orders placed or performed during the hospital encounter of 09/13/18 (from the past 48 hour(s))  TSH     Status: None   Collection Time: 09/15/18  6:39 AM  Result Value Ref Range   TSH 0.574 0.350 - 4.500 uIU/mL    Comment: Performed by a 3rd Generation assay with a functional sensitivity of <=0.01 uIU/mL. Performed at [facility], [address], [place], [place] [address]   Lipid panel     Status: None   Collection Time: 09/15/18  6:39 AM  Result Value Ref Range   Cholesterol 132 0 - 200 mg/dL   Triglycerides 51 <409 mg/dL   HDL 43 >81 mg/dL   Total CHOL/HDL Ratio 3.1 RATIO   VLDL 10 0 - 40 mg/dL   LDL Cholesterol 79 0 - 99 mg/dL    Comment:        Total Cholesterol/HDL:CHD Risk Coronary Heart Disease Risk Table                     Men   Women  1/2 Average Risk   3.4   3.3  Average Risk       5.0    4.4  2 X Average Risk   9.6   7.1  3 X Average Risk  23.4   11.0        Use the calculated Patient Ratio above and the CHD Risk Table to determine the patient's CHD Risk.        ATP III CLASSIFICATION (LDL):  <100     mg/dL   Optimal  161-096  mg/dL   Near or Above                    Optimal  130-159  mg/dL   Borderline  045-409  mg/dL   High  >811     mg/dL   Very High Performed at [facility], [address] [place], [place] [address]   Hemoglobin A1c     Status: None   Collection Time: 09/15/18  6:39 AM  Result Value Ref Range   Hgb A1c MFr Bld 5.0 4.8 - 5.6 %    Comment: (NOTE) Pre diabetes:          5.7%-6.4% Diabetes:              >6.4% Glycemic control for   <7.0% adults with diabetes    Mean Plasma Glucose 96.8 mg/dL    Comment: Performed at [facility], [address] [place], [place] [address]    Blood Alcohol level:  Lab Results  Component Value Date   ETH 71 (H) 09/13/2018   ETH 256 (H) 07/11/2018    Metabolic Disorder Labs: Lab Results  Component Value Date   HGBA1C 5.0 09/15/2018   MPG 96.8 09/15/2018   MPG 117 06/13/2016   No results found for: PROLACTIN Lab Results  Component Value Date   CHOL 132 09/15/2018   TRIG 51 09/15/2018   HDL 43 09/15/2018   CHOLHDL 3.1 09/15/2018   VLDL 10 09/15/2018   LDLCALC 79 09/15/2018   LDLCALC 122 (H) 06/13/2016    Physical Findings: AIMS:  , ,  ,  ,    CIWA:    COWS:     Musculoskeletal: Strength & Muscle Tone: within normal limits Gait & Station: normal Patient leans: N/A  Psychiatric Specialty Exam: Physical Exam  ROS  Blood pressure 119/84, pulse 83, temperature 98.4 F (36.9 C), temperature source Oral, resp. rate 20, height 5\' 6"  (1.676 m), weight 76.2 kg, SpO2 100 %.Body mass index is 27.12 kg/m.  General Appearance: Casual  Eye Contact:  Good  Speech:  Clear and Coherent  Volume:  Normal  Mood:  Dysphoric  Affect:  Congruent  Thought Process:  Coherent   Orientation:  Full (Time, Place, and Person)  Thought Content:  Tangential  Suicidal Thoughts:  No  Homicidal Thoughts:  No  Memory:  Immediate;   Good  Judgement:  Good  Insight:  Good  Psychomotor Activity:  Normal  Concentration:  Concentration: Good  Recall:  Good  Fund of Knowledge:  Good  Language:  Fair  Akathisia:  Negative  Handed:  Right  AIMS (if indicated):     Assets:  Communication Skills  ADL's:  Intact  Cognition:  WNL  Sleep:  Number of Hours: 6.25   Pt will be monitored 24 hrs more D-c in am if still able to contract   Treatment Plan Summary: Daily contact with patient to assess and evaluate symptoms and progress in treatment  Kerin Kren, MD 09/15/2018, 10:50 AM

## 2018-09-15 NOTE — Plan of Care (Signed)
  Problem: Safety: Goal: Periods of time without injury will increase Outcome: Progressing   Problem: Activity: Goal: Interest or engagement in leisure activities will improve Outcome: Progressing  DAR NOTE: Patient presents with anxious affect and depressed mood.  Denies suicidal thoughts, auditory and visual hallucinations.  Described energy level as low and concentration as poor.  Rates depression at 8, hopelessness at 8, and anxiety at 8.  Maintained on routine safety checks.  Medications given as prescribed.  Support and encouragement offered as needed.  Attended group and participated.  States goal for today is "staying clean.'  Patient observed socializing with peers in the dayroom.  Offered no complaint.

## 2018-09-16 MED ORDER — FLUOXETINE HCL 10 MG PO CAPS: 10.0000 mg | ORAL_CAPSULE | Freq: Every day | ORAL | 0 refills | Status: DC

## 2018-09-16 MED ORDER — QUETIAPINE FUMARATE 100 MG PO TABS: 100.0000 mg | ORAL_TABLET | Freq: Three times a day (TID) | ORAL | 0 refills | Status: DC

## 2018-09-16 MED ORDER — NICOTINE 21 MG/24HR TD PT24: 21.0000 mg | MEDICATED_PATCH | Freq: Every day | TRANSDERMAL | 0 refills | Status: DC

## 2018-09-16 MED ORDER — ATORVASTATIN CALCIUM 20 MG PO TABS: 20.0000 mg | ORAL_TABLET | Freq: Every day | ORAL | 0 refills | Status: DC

## 2018-09-16 MED ORDER — HYDROXYZINE HCL 25 MG PO TABS: 25.0000 mg | ORAL_TABLET | Freq: Three times a day (TID) | ORAL | 0 refills | Status: DC | PRN

## 2018-09-16 MED ORDER — CLOPIDOGREL BISULFATE 75 MG PO TABS: 75.0000 mg | ORAL_TABLET | Freq: Every day | ORAL | 0 refills | Status: DC

## 2018-09-16 MED ORDER — GABAPENTIN 300 MG PO CAPS: 600.0000 mg | ORAL_CAPSULE | Freq: Three times a day (TID) | ORAL | 0 refills | Status: DC

## 2018-09-16 MED ORDER — TRAZODONE HCL 50 MG PO TABS: 50.0000 mg | ORAL_TABLET | Freq: Every evening | ORAL | 0 refills | Status: DC | PRN

## 2018-09-16 MED ORDER — ASPIRIN 81 MG PO TBEC: 81.0000 mg | DELAYED_RELEASE_TABLET | Freq: Every day | ORAL | 3 refills | Status: DC

## 2018-09-16 MED ORDER — AMLODIPINE BESYLATE 5 MG PO TABS: 5.0000 mg | ORAL_TABLET | Freq: Every day | ORAL | 0 refills | Status: DC

## 2018-09-16 NOTE — Progress Notes (Addendum)
D: Pt A & O X 4. Denies SI, HI, AVH and pain at this time. D/C home as ordered. Presents irritable on interaction. Anxious & agitated related to d/c "y'all just got my medicaid money and now y'all want to put me out, I still need to be here". Observed pacing in hall, fidgety and restless.  A: D/C instructions reviewed with pt including prescriptions and follow up appointment; compliance encouraged. All belongings from locker #42 given to pt at time of departure. Scheduled medications given with verbal education and effects monitored. Safety checks maintained without incident till time of d/c.  R: Pt receptive to care. Compliant with medications when offered. Denies adverse drug reactions when assessed. Verbalized understanding related to d/c instructions. Signed belonging sheet in agreement with items received from locker. Ambulatory with a steady gait. Appears to be in no physical distress at time of departure.

## 2018-09-16 NOTE — Progress Notes (Signed)
  [facility] Adult Case Management Discharge Plan :  Will you be returning to the same living situation after discharge:  Yes,  boarding house At discharge, do you have transportation home?: No. Bus pass provided.   Do you have the ability to pay for your medications: Yes,  medicare/medicaid  Release of information consent forms completed and in the chart;  Patient's signature needed at discharge.  Patient to Follow up at: Follow-up Information    [facility]. Go on 09/18/2018.   Why:  Your hospital follow up appointment is Thursday November 7 at 9:30 a.m. Contact information: [address] [place] [address] [phone]           Next level of care provider has access to [facility] Link:no  Safety Planning and Suicide Prevention discussed: No. Pt declined consent.   Have you used any form of tobacco in the last 30 days? (Cigarettes, Smokeless Tobacco, Cigars, and/or Pipes): Yes  Has patient been referred to the Quitline?: Patient refused referral  Patient has been referred for addiction treatment: Yes  Lorri Frederick, LCSW 09/16/2018, 1:17 PM

## 2018-09-16 NOTE — Progress Notes (Signed)
Recreation Therapy Notes  INPATIENT RECREATION TR PLAN  Patient Details Name: Shinichi Anguiano MRN: [id number] DOB: [birth year] Today's Date: 09/16/2018  Rec Therapy Plan Is patient appropriate for Therapeutic Recreation?: Yes Treatment times per week: about 3 days Estimated Length of Stay: 5-7 days TR Treatment/Interventions: Group participation (Comment)  Discharge Criteria Pt will be discharged from therapy if:: Discharged Treatment plan/goals/alternatives discussed and agreed upon by:: Patient/family  Discharge Summary Short term goals set: See patient care plan Short term goals met: Not met Reason goals not met: Pt did not attend groups Therapeutic equipment acquired: N/A Reason patient discharged from therapy: Discharge from hospital Pt/family agrees with progress & goals achieved: Yes Date patient discharged from therapy: 09/16/18     Victorino Sparrow, LRT/CTRS  Ria Comment, Stokely Jeancharles A 09/16/2018, 11:17 AM

## 2018-09-16 NOTE — [facility] Group Notes (Signed)
[facility] LCSW Group Therapy Note  Date/Time: 09/16/18, 1100  Type of Therapy/Topic:  Group Therapy:  Feelings about Diagnosis  Participation Level:  None   Mood:irritable   Description of Group:    This group will allow patients to explore their thoughts and feelings about diagnoses they have received. Patients will be guided to explore their level of understanding and acceptance of these diagnoses. Facilitator will encourage patients to process their thoughts and feelings about the reactions of others to their diagnosis, and will guide patients in identifying ways to discuss their diagnosis with significant others in their lives. This group will be process-oriented, with patients participating in exploration of their own experiences as well as giving and receiving support and challenge from other group members.   Therapeutic Goals: 1. Patient will demonstrate understanding of diagnosis as evidence by identifying two or more symptoms of the disorder:  2. Patient will be able to express two feelings regarding the diagnosis 3. Patient will demonstrate ability to communicate their needs through discussion and/or role plays  Summary of Patient Progress:Pt initially came to group but made comments about "the doctor needs to come to these groups" and then walked out within a few minutes.  Pt upset about being discharged earlier than he would like.        Therapeutic Modalities:   Cognitive Behavioral Therapy Brief Therapy Feelings Identification   Daleen Squibb, LCSW

## 2018-09-16 NOTE — [facility] Suicide Risk Assessment (Signed)
[facility] Discharge Suicide Risk Assessment   Principal Problem: Cocaine-induced psychotic disorder [facility]) Discharge Diagnoses: same/issues of sec gain Patient Active Problem List   Diagnosis Date Noted  . 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 Chest pain [R07.9] 08/05/2018  . Ischemic dilated cardiomyopathy (HCC) [I25.5, I42.0] 08/04/2018  . Cocaine abuse (HCC) [F14.10] 04/30/2018  . Mild tetrahydrocannabinol (THC) abuse [F12.10] 04/30/2018  . Moderate episode of recurrent major depressive disorder (HCC) [F33.1] 04/30/2018  . Unstable angina (HCC) [I20.0] 06/13/2016  . Benign essential HTN [I10] 06/13/2016  . Smoking [F17.200] 06/13/2016  . Family history of heart attack [Z82.49]   . Schizoaffective disorder, depressive type (HCC) [F25.1] 09/08/2015  . Acute alcoholic intoxication in alcoholism with complication (HCC) [F10.229] 04/16/2015  . 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HCC) [Z61.096] 04/15/2015  . Polysubstance dependence (HCC) [F19.20] 04/13/2015    Total Time spent with patient: 20 minutes  Musculoskeletal: Strength & Muscle Tone: within normal limits Gait & Station: normal Patient leans: N/A  Psychiatric Specialty Exam: ROS  Blood pressure 134/86, pulse 96, temperature 99.3 F (37.4 C), temperature source Oral, resp. rate 20, height 5\' 6"  (1.676 m), weight 76.2 kg, SpO2 100 %.Body mass index is 27.12 kg/m.  General Appearance: Casual  Eye Contact::  Good  Speech:  Clear and Coherent409  Volume:  Normal  Mood:  Euthymic  Affect:  Congruent  Thought Process:  Coherent  Orientation:  Full (Time, Place, and Person)  Thought Content:  Logical  Suicidal Thoughts:  No  Homicidal Thoughts:  No  Memory:  Immediate;   Good  Judgement:  Good  Insight:  Good  Psychomotor Activity:  NA  Concentration:  Good  Recall:  Good  Fund of Knowledge:Good  Language: Good  Akathisia:  Negative  Handed:  Right  AIMS (if indicated):      Assets:  Communication Skills  Sleep:  Number of Hours: 6.25  Cognition: WNL  ADL's:  Intact   Mental Status Per Nursing Assessment::   On Admission:  NA  Demographic Factors:  Male  Loss Factors: Decrease in vocational status  Historical Factors: NA  Risk Reduction Factors:   Religious beliefs about death  Continued Clinical Symptoms:  none  Cognitive Features That Contribute To Risk:  None    Suicide Risk:  Minimal: No identifiable suicidal ideation.  Patients presenting with no risk factors but with morbid ruminations; may be classified as minimal risk based on the severity of the depressive symptoms    Plan Of Care/Follow-up recommendations:  Activity:  nl  Malvin Johns, MD 09/16/2018, 9:08 AM

## 2018-09-16 NOTE — Discharge Summary (Signed)
Physician Discharge Summary Note  Patient:  [name] [name] is an 57 y.o., male  MRN:  [id number]  DOB:  [birth year]  Patient phone:  [phone] (home)   Patient address:   [address] [place] [address],   Total Time spent with patient: Greater than 30 minutes  Date of Admission:  09/13/2018  Date of Discharge: 09-16-18  Reason for Admission: Worsening auditory, visual hallucinations, depression, suicidal ideations of jumping in front of a train, and cocaine abuse .   Principal Problem: Cocaine-induced psychotic disorder [name]  Discharge Diagnoses: Patient Active Problem List   Diagnosis Date Noted  . 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 Chest pain [R07.9] 08/05/2018  . Ischemic dilated cardiomyopathy (HCC) [I25.5, I42.0] 08/04/2018  . Cocaine abuse (HCC) [F14.10] 04/30/2018  . Mild tetrahydrocannabinol (THC) abuse [F12.10] 04/30/2018  . Moderate episode of recurrent major depressive disorder (HCC) [F33.1] 04/30/2018  . Unstable angina (HCC) [I20.0] 06/13/2016  . Benign essential HTN [I10] 06/13/2016  . Smoking [F17.200] 06/13/2016  . Family history of heart attack [Z82.49]   . Schizoaffective disorder, depressive type (HCC) [F25.1] 09/08/2015  . Acute alcoholic intoxication in alcoholism with complication (HCC) [F10.229] 04/16/2015  . 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HCC) [X91.478] 04/15/2015  . Polysubstance dependence (HCC) [F19.20] 04/13/2015   Past Psychiatric History: Hx. Schizoaffective disorder.  Past Medical History:  Past Medical History:  Diagnosis Date  . Arthritis   . Bipolar 1 disorder (HCC)   . Depression   . Hypertension   . Schizophrenia [facility])     Past Surgical History:  Procedure Laterality Date  . CHOLECYSTECTOMY N/A 12/14/2014   Procedure: LAPAROSCOPIC CHOLECYSTECTOMY WITH INTRAOPERATIVE CHOLANGIOGRAM;  Surgeon: Harriette Bouillon, MD;  Location: [facility] OR;  Service: General;  Laterality: N/A;  .  HERNIA REPAIR    . LEFT HEART CATH AND CORONARY ANGIOGRAPHY N/A 08/05/2018   Procedure: LEFT HEART CATH AND CORONARY ANGIOGRAPHY;  Surgeon: Corky Crafts, MD;  Location[facility] INVASIVE CV LAB;  Service: Cardiovascular;  Laterality: N/A;  . NO PAST SURGERIES     Family History:  Family History  Problem Relation Age of Onset  . Heart attack Mother 66  . Heart attack Father 68   Family Psychiatric  History: See H&P  Social History:  Social History   Substance and Sexual Activity  Alcohol Use No     Social History   Substance and Sexual Activity  Drug Use Yes  . Types: Marijuana, Cocaine    Social History   Socioeconomic History  . Marital status: Single    Spouse name: Not on file  . Number of children: Not on file  . Years of education: Not on file  . Highest education level: Not on file  Occupational History  . Not on file  Social Needs  . Financial resource strain: Not on file  . Food insecurity:    Worry: Not on file    Inability: Not on file  . Transportation needs:    Medical: Not on file    Non-medical: Not on file  Tobacco Use  . Smoking status: Current Every Day Smoker    Packs/day: 1.00    Years: 35.00    Pack years: 35.00  . Smokeless tobacco: Never Used  Substance and Sexual Activity  . Alcohol use: No  . Drug use: Yes    Types: Marijuana, Cocaine  . Sexual activity: Not on file  Lifestyle  . Physical activity:    Days per week: Not on file  Minutes per session: Not on file  . Stress: Not on file  Relationships  . Social connections:    Talks on phone: Not on file    Gets together: Not on file    Attends religious service: Not on file    Active member of club or organization: Not on file    Attends meetings of clubs or organizations: Not on file    Relationship status: Not on file  Other Topics Concern  . Not on file  Social History Narrative  . Not on file   [name] Course: (Per Md's admission SRA): 57 year old , divorced, has an  adult daughter, lives alone. Presented to the ED via EMS after experiencing dizziness while standing in line at a fast food restaurant. Was discharged but returned several hours later reporting auditory, visual hallucinations, depression, suicidal ideations of jumping in front of a train, and cocaine abuse . States he hears voice saying " it's all right, go ahead and get high". States he sometimes hears voice telling him to jump in front of a train. Regarding substance abuse, reports being  sober for about 5 -6 months until he relapsed about a week ago. Denies alcohol or BZD Abuse. States he has been off psychiatric medications for several months, and off his other medications x 1-2 weeks. Reports history of prior psychiatric admissions for depression, hallucinations, and states that he has been diagnosed with schizophrenia in the past. States he attempted suicide in the past by overdosing.  [name] was admitted to the [facility] adult unit for worsening symptoms of psychosis & suicidal ideation with plan of jumping in front of a train. He was also abusing cocaine. During the above admission evaluation, his presenting symptoms were identified. The medication management was discussed and initiated targeting those presenting symptoms. He was oriented to the unit, enrolled & encouraged to participate in the unit programming/group sessions. His other pre-existing medical problems were identified and treated appropriately by resuming his pertinent home medication for those health issues. He tolerated his treatment regimen without any adverse effects or reactions reported.  During the course of his [name] stay, [name] was evaluated each day by a clinical provider to ascertain his response to his treatment regimen. The medication changes & adjustments were made according to need. As the day goes by, improvement was noted as evidence by his report of decreasing symptoms, improved sleep, affect, medication tolerance,  behavior & participation in the unit programming. [name] was required on daily basis to complete a self-inventory asssessment noting mood, mental status, any new symptoms, anxiety & or concerns. His symptoms responded well to his treatment regimen, being in a therapeutic and supportive environment also assisted in his mood stability.   On this day of his [name] discharge, Omaree was in much improved condition than upon admission. His symptoms were reported as significantly decreased or resolved completely. Upon discharge, he denies SI/HI and voiced no AVH. He was motivated to continue taking medication with a goal of continued improvement in mental health. He was medicated & discharge on; Fluoxetine 20 mg for depression, Gabapentin 600 mg for agitation, Vistaril 25 mg prn for anxiety, Seroquel 100 mg for mood control & Trazodone 50 mg prn for insomnia. He is being discharged to follow-up care on an outpatient basis as noted below. He is provided with all the necessary information needed to make this appointment without problems.  He left [facility] in no apparent distress. Transportation per patient arrangement.   Physical Findings: AIMS:  , ,  ,  ,  CIWA:    COWS:     Musculoskeletal: Strength & Muscle Tone: within normal limits Gait & Station: normal Patient leans: N/A  Psychiatric Specialty Exam: Physical Exam  Constitutional: He appears well-developed.  HENT:  Head: Normocephalic.  Eyes: Pupils are equal, round, and reactive to light.  Neck: Normal range of motion.  Cardiovascular: Normal rate.  Respiratory: Effort normal.  GI: Soft.  Genitourinary:  Genitourinary Comments: Deferred  Musculoskeletal: Normal range of motion.  Neurological: He is alert.  Skin: Skin is warm.    Review of Systems  Constitutional: Negative.   HENT: Negative.   Eyes: Negative.   Respiratory: Negative.  Negative for cough and shortness of breath.   Cardiovascular: Negative.  Negative for chest pain and  palpitations.  Gastrointestinal: Negative.   Genitourinary: Negative.   Musculoskeletal: Negative.   Skin: Negative.   Neurological: Negative.   Endo/Heme/Allergies: Negative.   Psychiatric/Behavioral: Positive for depression (Stable), hallucinations ( Hx. psychosis (Stable).) and substance abuse (Hx. Alcohol/cocaine use disorders.). Negative for memory loss and suicidal ideas. The patient has insomnia (Stable). The patient is not nervous/anxious.     Blood pressure 134/86, pulse 96, temperature 99.3 F (37.4 C), temperature source Oral, resp. rate 20, height 5\' 6"  (1.676 m), weight 76.2 kg, SpO2 100 %.Body mass index is 27.12 kg/m.  See Md's discharge SRA   Have you used any form of tobacco in the last 30 days? (Cigarettes, Smokeless Tobacco, Cigars, and/or Pipes): Yes  Has this patient used any form of tobacco in the last 30 days? (Cigarettes, Smokeless Tobacco, Cigars, and/or Pipes): Yes, an FDA-approved tobacco cessation medication was offered at discharge.  Blood Alcohol level:  Lab Results  Component Value Date   ETH 71 (H) 09/13/2018   ETH 256 (H) 07/11/2018   Metabolic Disorder Labs:  Lab Results  Component Value Date   HGBA1C 5.0 09/15/2018   MPG 96.8 09/15/2018   MPG 117 06/13/2016   No results found for: PROLACTIN Lab Results  Component Value Date   CHOL 132 09/15/2018   TRIG 51 09/15/2018   HDL 43 09/15/2018   CHOLHDL 3.1 09/15/2018   VLDL 10 09/15/2018   LDLCALC 79 09/15/2018   LDLCALC 122 (H) 06/13/2016   See Psychiatric Specialty Exam and Suicide Risk Assessment completed by Attending Physician prior to discharge.  Discharge destination:  Home  Is patient on multiple antipsychotic therapies at discharge:  No   Has Patient had three or more failed trials of antipsychotic monotherapy by history:  No  Recommended Plan for Multiple Antipsychotic Therapies: NA  Allergies as of 09/16/2018   No Known Allergies     Medication List    STOP taking these  medications   gabapentin 600 MG tablet Commonly known as:  NEURONTIN Replaced by:  gabapentin 300 MG capsule     TAKE these medications     Indication  amLODipine 5 MG tablet Commonly known as:  NORVASC Take 1 tablet (5 mg total) by mouth daily. For high blood pressure What changed:  additional instructions  Indication:  High Blood Pressure Disorder   aspirin 81 MG EC tablet Take 1 tablet (81 mg total) by mouth daily. (May buy from over the counter): For heart health What changed:  additional instructions  Indication:  Heart health   atorvastatin 20 MG tablet Commonly known as:  LIPITOR Take 1 tablet (20 mg total) by mouth at bedtime. For high cholesterol What changed:  additional instructions  Indication:  Inherited Heterozygous Hypercholesterolemia, High Amount of  Fats in the Blood   clopidogrel 75 MG tablet Commonly known as:  PLAVIX Take 1 tablet (75 mg total) by mouth daily. For prevention of blood clot Start taking on:  09/17/2018 What changed:  additional instructions  Indication:  Disease of the Peripheral Arteries   FLUoxetine 10 MG capsule Commonly known as:  PROZAC Take 1 capsule (10 mg total) by mouth daily. For depression What changed:  additional instructions  Indication:  Major Depressive Disorder   gabapentin 300 MG capsule Commonly known as:  NEURONTIN Take 2 capsules (600 mg total) by mouth 3 (three) times daily. For agitation Replaces:  gabapentin 600 MG tablet  Indication:  Agitation   hydrOXYzine 25 MG tablet Commonly known as:  ATARAX/VISTARIL Take 1 tablet (25 mg total) by mouth 3 (three) times daily as needed for anxiety.  Indication:  Feeling Anxious   nicotine 21 mg/24hr patch Commonly known as:  NICODERM CQ - dosed in mg/24 hours Place 1 patch (21 mg total) onto the skin daily. (May buy from over the counter): For smoking cessation Start taking on:  09/17/2018  Indication:  Nicotine Addiction   QUEtiapine 100 MG tablet Commonly known  as:  SEROQUEL Take 1 tablet (100 mg total) by mouth 3 (three) times daily. For agitation/mood control What changed:    when to take this  additional instructions  Indication:  Agitation, Mood control   traZODone 50 MG tablet Commonly known as:  DESYREL Take 1 tablet (50 mg total) by mouth at bedtime as needed for sleep.  Indication:  Trouble Sleeping      Follow-up [employer]. Go on 09/18/2018.   Why:  Your [name] follow up appointment is Thursday November 7 at 9:30 a.m. Contact information: [address] [place] [address] [phone]         Follow-up recommendations: Activity:  As tolerated Diet: As recommended by your primary care doctor. Keep all scheduled follow-up appointments as recommended.   Comments: Patient is instructed prior to discharge to: Take all medications as prescribed by his/her mental healthcare provider. Report any adverse effects and or reactions from the medicines to his/her outpatient provider promptly. Patient has been instructed & cautioned: To not engage in alcohol and or illegal drug use while on prescription medicines. In the event of worsening symptoms, patient is instructed to call the crisis hotline, 911 and or go to the nearest ED for appropriate evaluation and treatment of symptoms. To follow-up with his/her primary care provider for your other medical issues, concerns and or health care needs.   Signed: Armandina Stammer, NP, PMHNP, FNP-BC 09/16/2018, 12:54 PM

## 2018-09-16 NOTE — [facility] Suicide Risk Assessment (Signed)
[facility] INPATIENT:  Family/Significant Other Suicide Prevention Education  Suicide Prevention Education:  Patient Refusal for Family/Significant Other Suicide Prevention Education: The patient [name] [name] has refused to provide written consent for family/significant other to be provided Family/Significant Other Suicide Prevention Education during admission and/or prior to discharge.  Physician notified.  Lorri Frederick, LCSW 09/16/2018, 9:18 AM

## 2018-09-16 NOTE — Progress Notes (Signed)
Recreation Therapy Notes  Date: 11.5.19 Time: 1000 Location: 500 Hall Dayroom  Group Topic: Wellness  Goal Area(s) Addresses:  Patient will define components of whole wellness. Patient will verbalize benefit of whole wellness.  Intervention: Music  Activity:  Exercise.  LRT lead group in a series of stretches to get them warmed up to complete the exercises.  Each patient would then lead the group in an exercise of their choice.  The group would complete 30 minutes of exercise.  Patients could take water break when needed.  Education: Wellness, Building control surveyor.   Education Outcome: Acknowledges education/In group clarification offered/Needs additional education.   Clinical Observations/Feedback: Pt did not attend group.    [name], LRT/CTRS         [name], [name] A 09/16/2018 11:10 AM

## 2018-09-16 NOTE — Progress Notes (Signed)
CSW met with pt after being informed that pt would be discharged today.  Pt reports he is currently staying in a boarding house near [place], moved there from [place] 4 months ago.  Pt wanted to discuss moving to "a program" like some sort of group home.  CSW discussed how this would work with pt turning over his disability check and being given a small allowance each month and pt reported that he was not interested in an arrangement like that.  Pt has paid his rent for the month of November and we discussed that he could look for a different boarding house and that [place] can provide some contact info.  Pt used to go to [facility] in [place], reports he has a PCP "on [address]" (now [facility]) but no psychiatrist in [place].  Pt agreed to referral to [facility]. Winferd Humphrey, MSW, LCSW Clinical Social Worker 09/16/2018 9:23 AM

## 2018-09-16 NOTE — Plan of Care (Signed)
Pt did not attend recreation therapy group sessions.   [name] [name], LRT/CTRS 

## 2019-10-20 DIAGNOSIS — Z87898 Personal history of other specified conditions: Secondary | ICD-10-CM | POA: Insufficient documentation

## 2019-10-20 DIAGNOSIS — R45851 Suicidal ideations: Secondary | ICD-10-CM | POA: Insufficient documentation

## 2019-12-18 DIAGNOSIS — F325 Major depressive disorder, single episode, in full remission: Secondary | ICD-10-CM | POA: Insufficient documentation

## 2020-06-16 DIAGNOSIS — G8929 Other chronic pain: Secondary | ICD-10-CM | POA: Insufficient documentation

## 2020-09-07 IMAGING — NM NM MYOCAR MULTI W/SPECT W/WALL MOTION & EF
2 series · 12 of 12 positions shown · non-contrast
Comparison: none

[Series 2: stress gated · 6.51mm/px · 6 of 64 frames shown (1 of 2)]
[frame 6/64]
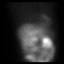
[frame 16/64]
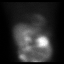
[frame 27/64]
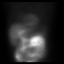
[frame 38/64]
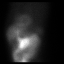
[frame 48/64]
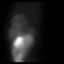
[frame 59/64]
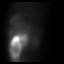

[Series 2: stress gated · 6.51mm/px · 6 of 512 frames shown (2 of 2)]
[frame 43/512]
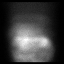
[frame 128/512]
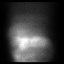
[frame 214/512]
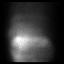
[frame 299/512]
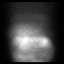
[frame 384/512]
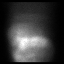
[frame 470/512]
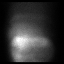

[12 of 12 positions shown; findings below may reference images not displayed]

Canned report from images found in remote index.

Refer to host system for actual result text.

## 2021-07-02 ENCOUNTER — Emergency Department (HOSPITAL_COMMUNITY)
Admission: EM | Admit: 2021-07-02 | Discharge: 2021-07-04 | Disposition: A | Discharge status: Home / Self Care | Payer: Medicare Other | Attending: Emergency Medicine | Admitting: Emergency Medicine

## 2021-07-02 ENCOUNTER — Encounter (HOSPITAL_COMMUNITY): Payer: Self-pay | Admitting: Oncology

## 2021-07-02 ENCOUNTER — Other Ambulatory Visit: Payer: Self-pay

## 2021-07-02 DIAGNOSIS — R55 Syncope and collapse: Secondary | ICD-10-CM | POA: Diagnosis not present

## 2021-07-02 DIAGNOSIS — W19XXXA Unspecified fall, initial encounter: Secondary | ICD-10-CM | POA: Diagnosis not present

## 2021-07-02 DIAGNOSIS — F333 Major depressive disorder, recurrent, severe with psychotic symptoms: Secondary | ICD-10-CM | POA: Diagnosis not present

## 2021-07-02 DIAGNOSIS — Z8616 Personal history of COVID-19: Secondary | ICD-10-CM

## 2021-07-02 DIAGNOSIS — Z79899 Other long term (current) drug therapy: Secondary | ICD-10-CM | POA: Diagnosis not present

## 2021-07-02 DIAGNOSIS — S0993XA Unspecified injury of face, initial encounter: Secondary | ICD-10-CM | POA: Diagnosis not present

## 2021-07-02 DIAGNOSIS — Y900 Blood alcohol level of less than 20 mg/100 ml: Secondary | ICD-10-CM | POA: Diagnosis not present

## 2021-07-02 DIAGNOSIS — I10 Essential (primary) hypertension: Secondary | ICD-10-CM | POA: Insufficient documentation

## 2021-07-02 DIAGNOSIS — F251 Schizoaffective disorder, depressive type: Secondary | ICD-10-CM | POA: Diagnosis present

## 2021-07-02 DIAGNOSIS — R45851 Suicidal ideations: Secondary | ICD-10-CM | POA: Diagnosis not present

## 2021-07-02 DIAGNOSIS — F29 Unspecified psychosis not due to a substance or known physiological condition: Secondary | ICD-10-CM | POA: Diagnosis not present

## 2021-07-02 DIAGNOSIS — F1410 Cocaine abuse, uncomplicated: Secondary | ICD-10-CM | POA: Insufficient documentation

## 2021-07-02 DIAGNOSIS — Z7982 Long term (current) use of aspirin: Secondary | ICD-10-CM | POA: Diagnosis not present

## 2021-07-02 DIAGNOSIS — Z7902 Long term (current) use of antithrombotics/antiplatelets: Secondary | ICD-10-CM | POA: Insufficient documentation

## 2021-07-02 DIAGNOSIS — R440 Auditory hallucinations: Secondary | ICD-10-CM | POA: Diagnosis present

## 2021-07-02 DIAGNOSIS — R443 Hallucinations, unspecified: Secondary | ICD-10-CM

## 2021-07-02 DIAGNOSIS — U071 COVID-19: Secondary | ICD-10-CM | POA: Diagnosis not present

## 2021-07-02 DIAGNOSIS — F17210 Nicotine dependence, cigarettes, uncomplicated: Secondary | ICD-10-CM | POA: Diagnosis not present

## 2021-07-02 HISTORY — DX: Personal history of COVID-19: Z86.16

## 2021-07-02 LAB — CBC
HCT: 45.4 % (ref 39.0–52.0)
Hemoglobin: 14.6 g/dL (ref 13.0–17.0)
MCH: 27.2 pg (ref 26.0–34.0)
MCHC: 32.2 g/dL (ref 30.0–36.0)
MCV: 84.7 fL (ref 80.0–100.0)
Platelets: 240 10*3/uL (ref 150–400)
RBC: 5.36 MIL/uL (ref 4.22–5.81)
RDW: 13.2 % (ref 11.5–15.5)
WBC: 2.2 10*3/uL — ABNORMAL LOW (ref 4.0–10.5)
nRBC: 0 % (ref 0.0–0.2)

## 2021-07-02 LAB — COMPREHENSIVE METABOLIC PANEL
ALT: 33 U/L (ref 0–44)
AST: 42 U/L — ABNORMAL HIGH (ref 15–41)
Albumin: 4.5 g/dL (ref 3.5–5.0)
Alkaline Phosphatase: 95 U/L (ref 38–126)
Anion gap: 9 (ref 5–15)
BUN: 15 mg/dL (ref 6–20)
CO2: 25 mmol/L (ref 22–32)
Calcium: 9.5 mg/dL (ref 8.9–10.3)
Chloride: 102 mmol/L (ref 98–111)
Creatinine, Ser: 1 mg/dL (ref 0.61–1.24)
GFR, Estimated: >60 mL/min (ref >60)
Glucose, Bld: 95 mg/dL (ref 70–99)
Potassium: 4.7 mmol/L (ref 3.5–5.1)
Sodium: 136 mmol/L (ref 135–145)
Total Bilirubin: 0.2 mg/dL — ABNORMAL LOW (ref 0.3–1.2)
Total Protein: 8 g/dL (ref 6.5–8.1)

## 2021-07-02 LAB — RAPID URINE DRUG SCREEN, HOSP PERFORMED
Amphetamines: NOT DETECTED
Barbiturates: NOT DETECTED
Benzodiazepines: NOT DETECTED
Cocaine: POSITIVE — AB
Opiates: NOT DETECTED
Tetrahydrocannabinol: POSITIVE — AB

## 2021-07-02 LAB — RESP PANEL BY RT-PCR (FLU A&B, COVID) ARPGX2
Influenza A by PCR: NEGATIVE
Influenza B by PCR: NEGATIVE
SARS Coronavirus 2 by RT PCR: POSITIVE — AB

## 2021-07-02 LAB — ETHANOL: Alcohol, Ethyl (B): <10 mg/dL (ref <10)

## 2021-07-02 LAB — ACETAMINOPHEN LEVEL: Acetaminophen (Tylenol), Serum: <10 ug/mL — ABNORMAL LOW (ref 10–30)

## 2021-07-02 LAB — SALICYLATE LEVEL: Salicylate Lvl: <7 mg/dL — ABNORMAL LOW (ref 7.0–30.0)

## 2021-07-02 MED ORDER — HYDROXYZINE HCL 25 MG PO TABS
25.0000 mg | ORAL_TABLET | Freq: Three times a day (TID) | ORAL | Status: DC | PRN
  Administered 2021-07-03: 25 mg via ORAL
  Filled 2021-07-02: qty 1

## 2021-07-02 MED ORDER — FLUOXETINE HCL 10 MG PO CAPS
10.0000 mg | ORAL_CAPSULE | Freq: Every day | ORAL | Status: DC
  Administered 2021-07-02 – 2021-07-04 (×3): 10 mg via ORAL
  Filled 2021-07-02 (×3): qty 1

## 2021-07-02 MED ORDER — TRAZODONE HCL 100 MG PO TABS
50.0000 mg | ORAL_TABLET | Freq: Every evening | ORAL | Status: DC | PRN
  Administered 2021-07-03: 50 mg via ORAL
  Filled 2021-07-02: qty 1

## 2021-07-02 MED ORDER — QUETIAPINE FUMARATE 100 MG PO TABS
100.0000 mg | ORAL_TABLET | Freq: Three times a day (TID) | ORAL | Status: DC
  Administered 2021-07-02 – 2021-07-04 (×6): 100 mg via ORAL
  Filled 2021-07-02 (×6): qty 1

## 2021-07-02 MED ORDER — NICOTINE 21 MG/24HR TD PT24
21.0000 mg | MEDICATED_PATCH | Freq: Every day | TRANSDERMAL | Status: DC
  Administered 2021-07-02: 21 mg via TRANSDERMAL
  Filled 2021-07-02 (×2): qty 1
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
## 2021-07-02 NOTE — BH Assessment (Signed)
TTS spoke to [facility], to put pt in a private room to complete TTS assessment.  Clinician to call the cart in five minutes.

## 2021-07-02 NOTE — ED Triage Notes (Signed)
Pt is reporting auditory hallucinations telling him to kill himself.  Pt states he was thinking of jumping in front of a train.

## 2021-07-02 NOTE — BH Assessment (Signed)
TTS spoke to [facility], to convey a message to Tera RN, to put Pt in a private room to complete TTS assessment.  Clinician to call the cart in ten minutes.

## 2021-07-03 DIAGNOSIS — F29 Unspecified psychosis not due to a substance or known physiological condition: Secondary | ICD-10-CM | POA: Diagnosis not present

## 2021-07-03 NOTE — ED Notes (Signed)
Patient calm and cooperative while taking PO medications. States "I just don't feel good" when asked what is wrong, patient states, "I don't know I don't want to talk about it." Advised patent if he needs to talk to call me. Call light in reach and instructed on use. Denies any needs at this time
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
## 2021-07-03 NOTE — Progress Notes (Signed)
CSW sent inpatient referral to the following facilities:   Service Provider Request Status Address Phone Fax  [facility]  Pending - No Request Sent [address] [place] [address] [phone]  [facility]  Pending - No Request Sent [address] [place]., [place] [address] [phone]  [facility]  Pending - No Request Sent [address] [place] [address] [phone]  [facility]  Pending - No Request Sent [address] [place] [address] [phone]  [facility]  Pending - No Request Sent [address] [place] [address] [phone]  [facility]  Pending - No Request Sent [address] [place], [place] [address] [phone]  [facility]  Pending - No Request Sent [address] [place], [place] [address] [phone]  [facility]  Pending - No Request Sent [address] [place] [address] [phone]  [facility]  Pending - No Request Sent [address] [place] [address] [phone]  [facility]  Pending - No Request Sent [address], [place] [address] [phone]    Maryjean Ka, MSW, [facility] 07/03/2021 6:27 PM

## 2021-07-03 NOTE — ED Notes (Signed)
Patient provided with toothbrush and toothpaste. Gave patient dinner tray. Patient more talkative. States he would like his phone to call sister so he could go home tomorrow. Advised patient that we were worried about him leaving, that he would talk to MD in the morning. Patient calmly accepting of plan at this time. Denies other needs

## 2021-07-03 NOTE — ED Notes (Signed)
[facility] contacted, currently Pt is COVID positive.  Disposition Social Worker will secure placement in the AM.

## 2021-07-04 ENCOUNTER — Emergency Department (HOSPITAL_COMMUNITY): Payer: Medicare Other

## 2021-07-04 ENCOUNTER — Encounter (HOSPITAL_COMMUNITY): Payer: Self-pay | Admitting: Emergency Medicine

## 2021-07-04 ENCOUNTER — Emergency Department (HOSPITAL_COMMUNITY)
Admission: EM | Admit: 2021-07-04 | Discharge: 2021-07-04 | Disposition: A | Discharge status: Home / Self Care | Payer: Medicare Other | Source: Home / Self Care | Attending: Emergency Medicine | Admitting: Emergency Medicine

## 2021-07-04 ENCOUNTER — Other Ambulatory Visit: Payer: Self-pay

## 2021-07-04 DIAGNOSIS — F29 Unspecified psychosis not due to a substance or known physiological condition: Secondary | ICD-10-CM | POA: Diagnosis not present

## 2021-07-04 DIAGNOSIS — Z79899 Other long term (current) drug therapy: Secondary | ICD-10-CM | POA: Insufficient documentation

## 2021-07-04 DIAGNOSIS — R55 Syncope and collapse: Secondary | ICD-10-CM | POA: Insufficient documentation

## 2021-07-04 DIAGNOSIS — S0993XA Unspecified injury of face, initial encounter: Secondary | ICD-10-CM

## 2021-07-04 DIAGNOSIS — F17210 Nicotine dependence, cigarettes, uncomplicated: Secondary | ICD-10-CM | POA: Insufficient documentation

## 2021-07-04 DIAGNOSIS — I10 Essential (primary) hypertension: Secondary | ICD-10-CM | POA: Insufficient documentation

## 2021-07-04 DIAGNOSIS — W19XXXA Unspecified fall, initial encounter: Secondary | ICD-10-CM | POA: Insufficient documentation

## 2021-07-04 DIAGNOSIS — Z7982 Long term (current) use of aspirin: Secondary | ICD-10-CM | POA: Insufficient documentation

## 2021-07-04 DIAGNOSIS — U071 COVID-19: Secondary | ICD-10-CM | POA: Insufficient documentation

## 2021-07-04 DIAGNOSIS — E860 Dehydration: Secondary | ICD-10-CM

## 2021-07-04 LAB — CBC WITH DIFFERENTIAL/PLATELET
Abs Immature Granulocytes: 0 10*3/uL (ref 0.00–0.07)
Basophils Absolute: 0 10*3/uL (ref 0.0–0.1)
Basophils Relative: 0 %
Eosinophils Absolute: 0 10*3/uL (ref 0.0–0.5)
Eosinophils Relative: 0 %
HCT: 42.4 % (ref 39.0–52.0)
Hemoglobin: 13.9 g/dL (ref 13.0–17.0)
Lymphocytes Relative: 19 %
Lymphs Abs: 0.8 10*3/uL (ref 0.7–4.0)
MCH: 27.6 pg (ref 26.0–34.0)
MCHC: 32.8 g/dL (ref 30.0–36.0)
MCV: 84.1 fL (ref 80.0–100.0)
Monocytes Absolute: 0.2 10*3/uL (ref 0.1–1.0)
Monocytes Relative: 5 %
Neutro Abs: 3.2 10*3/uL (ref 1.7–7.7)
Neutrophils Relative %: 76 %
Platelets: 221 10*3/uL (ref 150–400)
RBC: 5.04 MIL/uL (ref 4.22–5.81)
RDW: 13 % (ref 11.5–15.5)
WBC: 4.2 10*3/uL (ref 4.0–10.5)
nRBC: 0 % (ref 0.0–0.2)
nRBC: 0 /100 WBC

## 2021-07-04 LAB — BASIC METABOLIC PANEL
Anion gap: 7 (ref 5–15)
BUN: 18 mg/dL (ref 6–20)
CO2: 25 mmol/L (ref 22–32)
Calcium: 8.8 mg/dL — ABNORMAL LOW (ref 8.9–10.3)
Chloride: 104 mmol/L (ref 98–111)
Creatinine, Ser: 1.21 mg/dL (ref 0.61–1.24)
GFR, Estimated: >60 mL/min (ref >60)
Glucose, Bld: 112 mg/dL — ABNORMAL HIGH (ref 70–99)
Potassium: 4.1 mmol/L (ref 3.5–5.1)
Sodium: 136 mmol/L (ref 135–145)

## 2021-07-04 LAB — D-DIMER, QUANTITATIVE: D-Dimer, Quant: 0.95 ug/mL-FEU — ABNORMAL HIGH (ref 0.00–0.50)

## 2021-07-04 MED ORDER — SODIUM CHLORIDE 0.9 % IV BOLUS
1000.0000 mL | Freq: Once | INTRAVENOUS | Status: AC
  Administered 2021-07-04: 1000 mL via INTRAVENOUS

## 2021-07-04 MED ORDER — GABAPENTIN 300 MG PO CAPS
600.0000 mg | ORAL_CAPSULE | Freq: Three times a day (TID) | ORAL | Status: DC
  Administered 2021-07-04: 600 mg via ORAL
  Filled 2021-07-04: qty 2

## 2021-07-04 MED ORDER — AMLODIPINE BESYLATE 5 MG PO TABS
10.0000 mg | ORAL_TABLET | Freq: Every day | ORAL | Status: DC
Start: 2021-07-04 — End: 2021-07-04
  Administered 2021-07-04: 10 mg via ORAL
  Filled 2021-07-04: qty 2

## 2021-07-04 MED ORDER — IOHEXOL 350 MG/ML SOLN
60.0000 mL | Freq: Once | INTRAVENOUS | Status: AC | PRN
  Administered 2021-07-04: 60 mL via INTRAVENOUS

## 2021-07-04 NOTE — ED Provider Notes (Addendum)
[facility] EMERGENCY DEPARTMENT Provider Note   CSN: [id number] Arrival date & time: 07/04/21  1436     History Chief Complaint  Patient presents with  . Loss of Consciousness    [name] [name] is a 60 y.o. male.  HPI Patient presents same day as discharge from our affiliated facility now after a fall with possible syncope. He went to our affiliated facility with concern for suicidal ideation, was discharged after behavioral health evaluation, currently has no suicidal ideation.  He notes that after going the bus stop he felt lightheaded, lost consciousness and struck his face against a bench.  He has mild pain in his nose, lip, but generally complains of pain in his inferior posterior neck.  No weakness in any extremity, no confusion, disorientation, vision changes. No medication taken for relief. Reportedly, per EMS, the patient was hypotensive, 70/40, bradycardic, 40s.  This improved with fluid resuscitation. Patient was informed that he is COVID-positive during his recent hospitalization, but notes that this was a surprise, as he has no overt COVID-like symptoms.     Past Medical History:  Diagnosis Date  . Arthritis   . Bipolar 1 disorder (HCC)   . Depression   . Hypertension   . Schizophrenia [facility])     Patient Active Problem List   Diagnosis Date Noted  . 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 Chest pain 08/05/2018  . Ischemic dilated cardiomyopathy (HCC) 08/04/2018  . Cocaine abuse (HCC) 04/30/2018  . Mild tetrahydrocannabinol (THC) abuse 04/30/2018  . Moderate episode of recurrent major depressive disorder (HCC) 04/30/2018  . Unstable angina (HCC) 06/13/2016  . Benign essential HTN 06/13/2016  . Smoking 06/13/2016  . Family history of heart attack   . Schizoaffective disorder, depressive type (HCC) 09/08/2015  . Acute alcoholic intoxication in alcoholism with complication (HCC) 04/16/2015  . Cocaine-induced psychotic disorder  with hallucinations (HCC) 04/15/2015  . Polysubstance dependence (HCC) 04/13/2015    Past Surgical History:  Procedure Laterality Date  . CHOLECYSTECTOMY N/A 12/14/2014   Procedure: LAPAROSCOPIC CHOLECYSTECTOMY WITH INTRAOPERATIVE CHOLANGIOGRAM;  Surgeon: Harriette Bouillon, MD;  Location: [facility] OR;  Service: General;  Laterality: N/A;  . HERNIA REPAIR    . LEFT HEART CATH AND CORONARY ANGIOGRAPHY N/A 08/05/2018   Procedure: LEFT HEART CATH AND CORONARY ANGIOGRAPHY;  Surgeon: Corky Crafts, MD;  Location: [facility] INVASIVE CV LAB;  Service: Cardiovascular;  Laterality: N/A;  . NO PAST SURGERIES         Family History  Problem Relation Age of Onset  . Heart attack Mother 58  . Heart attack Father 21    Social History   Tobacco Use  . Smoking status: Every Day    Packs/day: 1.00    Years: 35.00    Pack years: 35.00    Types: Cigarettes  . Smokeless tobacco: Never  Substance Use Topics  . Alcohol use: No  . Drug use: Yes    Types: Marijuana, Cocaine    Home Medications Prior to Admission medications   Medication Sig Start Date End Date Taking? Authorizing Provider  amLODipine (NORVASC) 5 MG tablet Take 1 tablet (5 mg total) by mouth daily. For high blood pressure Patient taking differently: Take 10 mg by mouth daily. For high blood pressure 09/16/18   Armandina Stammer I, NP  aspirin 81 MG EC tablet Take 1 tablet (81 mg total) by mouth daily. (May buy from over the counter): For heart health 09/16/18   Armandina Stammer I, NP  atorvastatin (LIPITOR) 20  MG tablet Take 1 tablet (20 mg total) by mouth at bedtime. For high cholesterol Patient not taking: Reported on 07/03/2021 09/16/18   Armandina StammerNwoko, Agnes I, NP  clopidogrel (PLAVIX) 75 MG tablet Take 1 tablet (75 mg total) by mouth daily. For prevention of blood clot Patient not taking: Reported on 07/03/2021 09/17/18   Armandina StammerNwoko, Agnes I, NP  FLUoxetine (PROZAC) 10 MG capsule Take 1 capsule (10 mg total) by mouth daily. For depression 09/16/18   Armandina StammerNwoko, Agnes  I, NP  gabapentin (NEURONTIN) 300 MG capsule Take 2 capsules (600 mg total) by mouth 3 (three) times daily. For agitation 09/16/18   Armandina StammerNwoko, Agnes I, NP  hydrOXYzine (ATARAX/VISTARIL) 25 MG tablet Take 1 tablet (25 mg total) by mouth 3 (three) times daily as needed for anxiety. Patient not taking: Reported on 07/03/2021 09/16/18   Armandina StammerNwoko, Agnes I, NP  nicotine (NICODERM CQ - DOSED IN MG/24 HOURS) 21 mg/24hr patch Place 1 patch (21 mg total) onto the skin daily. (May buy from over the counter): For smoking cessation Patient not taking: Reported on 07/03/2021 09/17/18   Armandina StammerNwoko, Agnes I, NP  QUEtiapine (SEROQUEL) 100 MG tablet Take 1 tablet (100 mg total) by mouth 3 (three) times daily. For agitation/mood control Patient not taking: Reported on 07/03/2021 09/16/18   Armandina StammerNwoko, Agnes I, NP  traZODone (DESYREL) 50 MG tablet Take 1 tablet (50 mg total) by mouth at bedtime as needed for sleep. Patient not taking: No sig reported 09/16/18   Sanjuana KavaNwoko, Agnes I, NP    Allergies    Patient has no known allergies.  Review of Systems   Review of Systems  Physical Exam Updated Vital Signs BP (!) 156/87 (BP Location: Right Arm)   Pulse 82   Temp 98.7 F (37.1 C) (Oral)   Resp 16   SpO2 100%   Physical Exam Vitals and nursing note reviewed.  Constitutional:      General: He is not in acute distress.    Appearance: He is well-developed.  HENT:     Head: Normocephalic.   Eyes:     Conjunctiva/sclera: Conjunctivae normal.  Cardiovascular:     Rate and Rhythm: Normal rate and regular rhythm.  Pulmonary:     Effort: Pulmonary effort is normal. No respiratory distress.     Breath sounds: No stridor.  Abdominal:     General: There is no distension.  Musculoskeletal:     Cervical back: Normal range of motion and neck supple. No rigidity or tenderness.  Skin:    General: Skin is warm and dry.  Neurological:     Mental Status: He is alert and oriented to person, place, and time.    ED Results / Procedures /  Treatments   Labs (all labs ordered are listed, but only abnormal results are displayed) Labs Reviewed  BASIC METABOLIC PANEL - Abnormal; Notable for the following components:      Result Value   Glucose, Bld 112 (*)    Calcium 8.8 (*)    All other components within normal limits  D-DIMER, QUANTITATIVE - Abnormal; Notable for the following components:   D-Dimer, Quant 0.95 (*)    All other components within normal limits  CBC WITH DIFFERENTIAL/PLATELET    EKG EKG Interpretation  Date/Time:  Tuesday July 04 2021 14:43:17 EDT Ventricular Rate:  85 PR Interval:  165 QRS Duration: 97 QT Interval:  395 QTC Calculation: 470 R Axis:   74 Text Interpretation: Sinus rhythm LAE, consider biatrial enlargement Left ventricular hypertrophy Nonspecific  T abnormalities, lateral leads ST elevation, consider anterior injury No significant change since prior 11/19 Confirmed by Meridee Score [phone]) on 07/04/2021 2:45:56 PM  Radiology CT Head Wo Contrast  Result Date: 07/04/2021 CLINICAL DATA:  Poly trauma, critical, head and C-spine injury suspected. Patient passed out at the bus stop and fell, striking head on concrete. EXAM: CT HEAD WITHOUT CONTRAST CT CERVICAL SPINE WITHOUT CONTRAST TECHNIQUE: Multidetector CT imaging of the head and cervical spine was performed following the standard protocol without intravenous contrast. Multiplanar CT image reconstructions of the cervical spine were also generated. COMPARISON:  CT head 06/15/2020.  CT cervical spine 07/12/2018 FINDINGS: CT HEAD FINDINGS Brain: No evidence of acute infarction, hemorrhage, hydrocephalus, extra-axial collection or mass lesion/mass effect. Vascular: Intracranial arterial vascular calcifications. Skull: Calvarium appears intact. Sinuses/Orbits: Mild mucosal thickening in the paranasal sinuses. No acute air-fluid levels. Mastoid air cells are clear. Other: None. CT CERVICAL SPINE FINDINGS Alignment: Normal alignment of the cervical  spine, unchanged since prior study. C1-2 articulation appears intact. Skull base and vertebrae: No acute fracture. No primary bone lesion or focal pathologic process. Soft tissues and spinal canal: No prevertebral fluid or swelling. No visible canal hematoma. Disc levels: Degenerative changes with mild disc space narrowing and osteophyte formation at the endplates throughout. Mild degenerative changes in the facet joints with mild uncovertebral spurring. Upper chest: Lung apices are clear. Other: None. IMPRESSION: 1. No acute intracranial abnormalities. 2. Normal alignment of the cervical spine. Mild degenerative changes. No acute displaced fractures identified. Electronically Signed   By: Burman Nieves M.D.   On: 07/04/2021 17:45   CT Angio Chest PE W/Cm &/Or Wo Cm  Result Date: 07/04/2021 CLINICAL DATA:  Positive D-dimer.  Concern for pulmonary embolism. EXAM: CT ANGIOGRAPHY CHEST WITH CONTRAST TECHNIQUE: Multidetector CT imaging of the chest was performed using the standard protocol during bolus administration of intravenous contrast. Multiplanar CT image reconstructions and MIPs were obtained to evaluate the vascular anatomy. CONTRAST:  58mL OMNIPAQUE IOHEXOL 350 MG/ML SOLN COMPARISON:  Chest CT dated 08/18/2015. FINDINGS: Cardiovascular: There is no cardiomegaly or pericardial effusion. There is coronary vascular calcification. Mild atherosclerotic calcification of the thoracic aorta. No aneurysmal dilatation. Evaluation of the pulmonary arteries is limited due to respiratory motion artifact and suboptimal opacification and timing of the contrast. No pulmonary artery embolus identified. Mediastinum/Nodes: No hilar or mediastinal adenopathy. The esophagus and the thyroid gland are grossly unremarkable. No mediastinal fluid collection. Lungs/Pleura: The lungs are clear. There is no pleural effusion or pneumothorax. The central airways are patent. And accessory right upper lobe bronchus arises directly from  the trachea. Upper Abdomen: No acute abnormality. Musculoskeletal: Degenerative changes of the spine. No acute osseous pathology. Review of the MIP images confirms the above findings. IMPRESSION: 1. No acute intrathoracic pathology. No pulmonary artery embolism identified. 2. Aortic Atherosclerosis (ICD10-I70.0). Electronically Signed   By: Elgie Collard M.D.   On: 07/04/2021 20:27   CT Cervical Spine Wo Contrast  Result Date: 07/04/2021 CLINICAL DATA:  Poly trauma, critical, head and C-spine injury suspected. Patient passed out at the bus stop and fell, striking head on concrete. EXAM: CT HEAD WITHOUT CONTRAST CT CERVICAL SPINE WITHOUT CONTRAST TECHNIQUE: Multidetector CT imaging of the head and cervical spine was performed following the standard protocol without intravenous contrast. Multiplanar CT image reconstructions of the cervical spine were also generated. COMPARISON:  CT head 06/15/2020.  CT cervical spine 07/12/2018 FINDINGS: CT HEAD FINDINGS Brain: No evidence of acute infarction, hemorrhage, hydrocephalus, extra-axial collection or mass  lesion/mass effect. Vascular: Intracranial arterial vascular calcifications. Skull: Calvarium appears intact. Sinuses/Orbits: Mild mucosal thickening in the paranasal sinuses. No acute air-fluid levels. Mastoid air cells are clear. Other: None. CT CERVICAL SPINE FINDINGS Alignment: Normal alignment of the cervical spine, unchanged since prior study. C1-2 articulation appears intact. Skull base and vertebrae: No acute fracture. No primary bone lesion or focal pathologic process. Soft tissues and spinal canal: No prevertebral fluid or swelling. No visible canal hematoma. Disc levels: Degenerative changes with mild disc space narrowing and osteophyte formation at the endplates throughout. Mild degenerative changes in the facet joints with mild uncovertebral spurring. Upper chest: Lung apices are clear. Other: None. IMPRESSION: 1. No acute intracranial abnormalities.  2. Normal alignment of the cervical spine. Mild degenerative changes. No acute displaced fractures identified. Electronically Signed   By: Burman Nieves M.D.   On: 07/04/2021 17:45    Procedures Procedures   Medications Ordered in ED Medications  sodium chloride 0.9 % bolus 1,000 mL (0 mLs Intravenous Stopped 07/04/21 1721)  sodium chloride 0.9 % bolus 1,000 mL (1,000 mLs Intravenous New Bag/Given 07/04/21 2039)  iohexol (OMNIPAQUE) 350 MG/ML injection 60 mL (60 mLs Intravenous Contrast Given 07/04/21 2012)    ED Course  I have reviewed the triage vital signs and the nursing notes.  Pertinent labs & imaging results that were available during my care of the patient were reviewed by me and considered in my medical decision making (see chart for details).  Clinical Course as of 07/11/21 7001  Tue Jul 04, 2021  1707 Tear Drop Cells: PRESENT [MH]    Clinical Course User Index [MH] Randie Heinz    9:04 PM Patient feeling better.  MDM Rules/Calculators/A&P Adult male presents same day as discharged from our affiliated facility after a possible episode of syncope that occurred while going to the bus stop.  Here the patient is found to have superficial injuries to his face, but no cervical spine injuries, low suspicion for intracranial hemorrhage given his absence of neuro complaints or findings.  Patient's evaluation here included evaluation for pulmonary embolism given his known COVID diagnosis, today's episode of syncope.  Was reassuring, with CT angiography without demonstration of pulmonary embolism.  No evidence for pneumonia, low suspicion for bacteremia, sepsis, some suspicion for mild dehydration contributing to the patient's episode of syncope earlier in the day, no evidence for sustained arrhythmia. Patient appropriate for discharge with outpatient follow-up as needed. Final Clinical Impression(s) / ED Diagnoses Final diagnoses:  Dehydration  Near syncope   Facial injury, initial encounter  COVID     Gerhard Munch, MD 07/04/21 2109    Gerhard Munch, MD 07/11/21 [phone]

## 2021-07-04 NOTE — ED Triage Notes (Addendum)
Pt was just discharged from [facility] today. Pt went to bus stop- passed out at the bus stop. Pt hit his head on the concrete. Pt was brought here from the site. Initial BP 70/40. Pt briefly bradycardic for EMS in the 40's. HR returned to normal in the 86. Pt has been alert and oriented for EMS. Pt reportedly is covid pos. EMS gave NS. Pt arrives in a C collar. Pt has abrasion to nose and chin, complains of neck pain.

## 2021-07-04 NOTE — ED Notes (Signed)
Checked on patient. No needs at this time. Patient went back to sleep.

## 2021-07-04 NOTE — Discharge Instructions (Addendum)
As discussed, your evaluation today has been largely reassuring.  But, it is important that you monitor your condition carefully, and do not hesitate to return to the ED if you develop new, or concerning changes in your condition. ? ?Otherwise, please follow-up with your physician for appropriate ongoing care. ? ?
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
## 2021-07-04 NOTE — ED Provider Notes (Addendum)
Emergency Medicine Observation Re-evaluation Note  [name] [name] is a 60 y.o. male, seen on rounds today.  Pt initially presented to the ED for complaints of Suicidal Currently, the patient is awaiting inpatient psychiatric care placement.  Physical Exam  BP (!) 141/89 (BP Location: Left Arm)   Pulse 80   Temp 98.3 F (36.8 C) (Oral)   Resp 18   Ht 5\' 6"  (1.676 m)   Wt 74.8 kg   SpO2 97%   BMI 26.63 kg/m  Physical Exam General: Resting comfortably but did not sleep well last Cardiac: No murmur Lungs: Clear Psych: Resting  ED Course / MDM  EKG:   I have reviewed the labs performed to date as well as medications administered while in observation.  Recent changes in the last 24 hours include was found to be COVID-positive.  Awaiting psych recommendations.  Requested home gabapentin and blood pressure medicine which was ordered.  Plan  Current plan is for awaiting psychiatric recommendations.  [name] [name] is not under involuntary commitment.     [name] [name], Gwyneth Sprout, MD 07/04/21 1007     12:10 PM Nursing and social work report that patient is now safe for discharge home.  Patient will treat his COVID at home and maintain hydration.  He agrees with plan of care and patient will be discharged.    [name] [name], 07/06/21, MD 07/04/21 1249

## 2021-07-04 NOTE — ED Notes (Signed)
Sitter aware vitals needed to be updated.

## 2021-07-04 NOTE — Discharge Instructions (Addendum)
After speaking with the mental health team today, that they feel you are safe for discharge home.  Please rest and stay hydrated and treat any fevers with Motrin or Tylenol.  For your behavioral health needs you are advised follow up with an outpatient provider.  Contact one of the following providers at your earliest opportunity to schedule an intake appointment:  .    [facility]      [address]      [place], [place] [address]      [phone]       [facility]      [address]      [place], [place] [address]      [phone]        [facility]      [address]      [place], [place] [address]      [phone]

## 2021-07-04 NOTE — BH Assessment (Signed)
[facility] Assessment Progress Note   Per Dorena Bodo, NP, this voluntary pt does not require psychiatric hospitalization at this time.  Pt is psychiatrically cleared.  Discharge instructions include referral information for area outpatient providers for follow up at pt's initiative.  Reviewing note authored by [employer], TS at 01:31, this Clinical research associate finds that pt reports having access to a firearm, but that pt's sister may have confiscated it.  Additionally, pt refuses to permit contact with his daughter in the community.  Today, Clydie Braun reports that pt denies these assertions.  I have spoken to off-duty [facility] police on campus, and he reports that they are prohibited from intervening if pt is not found to be a danger to self or others, which is currently the case for this pt.  He has provided me with phone number for [facility] [phone]), who have standing to intervene.  After staffing this with Clydie Braun, she advises me to contact the sheriff.  I placed call at 13:12 and spoke to [facility]. Christell Constant, who took report.  Pt is psychiatrically cleared, and EDP Lynden Oxford, MD and pt's nurse, [place], have been notified.  Doylene Canning, MA Triage Specialist [phone]

## 2022-01-15 ENCOUNTER — Other Ambulatory Visit: Payer: Self-pay | Admitting: Orthopedic Surgery

## 2022-01-15 DIAGNOSIS — Z9889 Other specified postprocedural states: Secondary | ICD-10-CM

## 2022-01-15 DIAGNOSIS — S8001XA Contusion of right knee, initial encounter: Secondary | ICD-10-CM

## 2022-01-15 DIAGNOSIS — Z01818 Encounter for other preprocedural examination: Secondary | ICD-10-CM

## 2022-01-15 MED ORDER — DEXTROSE 5 % IV SOLN: 2.0000 g | INTRAVENOUS | Status: AC

## 2022-01-15 MED ORDER — TRANEXAMIC ACID 1000 MG/10ML IV SOLN: 1000.0000 mg | INTRAVENOUS | Status: AC

## 2022-01-15 MED ORDER — DEXTROSE 5 % IV SOLN: 600.0000 mg | Freq: Once | INTRAVENOUS | Status: AC

## 2022-01-25 ENCOUNTER — Inpatient Hospital Stay
Admission: RE | Admit: 2022-01-25 | Discharge: 2022-01-25 | Disposition: A | Discharge status: Home / Self Care | Payer: Medicare Other | Source: Ambulatory Visit | Attending: Orthopedic Surgery | Admitting: Orthopedic Surgery

## 2022-01-25 NOTE — Patient Instructions (Addendum)
? ?Your procedure is scheduled on: 02/06/22 - Tuesday ?Report to the Registration Desk on the 1st floor of the Medical Mall. ?To find out your arrival time, please call [phone] between 1PM - 3PM on: 02/05/22 - Monday ? ?REMEMBER: ?Instructions that are not followed completely may result in serious medical risk, up to and including death; or upon the discretion of your surgeon and anesthesiologist your surgery may need to be rescheduled. ? ?Do not eat food after midnight the night before surgery.  ?No gum chewing, lozengers or hard candies. ? ?You may however, drink CLEAR liquids up to 2 hours before you are scheduled to arrive for your surgery. Do not drink anything within 2 hours of your scheduled arrival time. ? ?Clear liquids include: ?- water  ?- apple juice without pulp ?- gatorade (not RED colors) ?- black coffee or tea (Do NOT add milk or creamers to the coffee or tea) ?Do NOT drink anything that is not on this list. ? ?TAKE THESE MEDICATIONS THE MORNING OF SURGERY WITH A SIP OF WATER: ?- gabapentin (NEURONTIN) 600 MG tablet ? ?One week prior to surgery: ?Stop Anti-inflammatories (NSAIDS) such as Advil, Aleve, Ibuprofen, Motrin, Naproxen, Naprosyn and Aspirin based products such as Excedrin, Goodys Powder, BC Powder. ? ?Stop ANY OVER THE COUNTER supplements until after surgery. ? ?You may however, continue to take Tylenol if needed for pain up until the day of surgery. ? ?No Alcohol for 24 hours before or after surgery. ? ?No Smoking including e-cigarettes for 24 hours prior to surgery.  ?No chewable tobacco products for at least 6 hours prior to surgery.  ?No nicotine patches on the day of surgery. ? ?Do not use any "recreational" drugs for at least a week prior to your surgery.  ?Please be advised that the combination of cocaine and anesthesia may have negative outcomes, up to and including death. ?If you test positive for cocaine, your surgery will be cancelled. ? ?On the morning of surgery brush  your teeth with toothpaste and water, you may rinse your mouth with mouthwash if you wish. ?Do not swallow any toothpaste or mouthwash. ? ?Use CHG Soap or wipes as directed on instruction sheet. ? ?Do not wear jewelry, make-up, hairpins, clips or nail polish. ? ?Do not wear lotions, powders, or perfumes.  ? ?Do not shave body from the neck down 48 hours prior to surgery just in case you cut yourself which could leave a site for infection.  ?Also, freshly shaved skin may become irritated if using the CHG soap. ? ?Contact lenses, hearing aids and dentures may not be worn into surgery. ? ?Do not bring valuables to the hospital. [facility] is not responsible for any missing/lost belongings or valuables.  ? ?Notify your doctor if there is any change in your medical condition (cold, fever, infection). ? ?Wear comfortable clothing (specific to your surgery type) to the hospital. ? ?After surgery, you can help prevent lung complications by doing breathing exercises.  ?Take deep breaths and cough every 1-2 hours. Your doctor may order a device called an Incentive Spirometer to help you take deep breaths. ?When coughing or sneezing, hold a pillow firmly against your incision with both hands. This is called ?splinting.? Doing this helps protect your incision. It also decreases belly discomfort. ? ?If you are being admitted to the hospital overnight, leave your suitcase in the car. ?After surgery it may be brought to your room. ? ?If you are being discharged the day of surgery, you  will not be allowed to drive home. ?You will need a responsible adult (18 years or older) to drive you home and stay with you that night.  ? ?If you are taking public transportation, you will need to have a responsible adult (18 years or older) with you. ?Please confirm with your physician that it is acceptable to use public transportation.  ? ?Please call the Pre-admissions Testing Dept. at [phone] if you have any questions about these  instructions. ? ?Surgery Visitation Policy: ? ?Patients undergoing a surgery or procedure may have one family member or support person with them as [place] as that person is not COVID-19 positive or experiencing its symptoms.  ?That person may remain in the waiting area during the procedure and may rotate out with other people. ? ?Inpatient Visitation:   ? ?Visiting hours are 7 a.m. to 8 p.m. ?Up to two visitors ages 16+ are allowed at one time in a patient room. The visitors may rotate out with other people during the day. Visitors must check out when they leave, or other visitors will not be allowed. One designated support person may remain overnight. ?The visitor must pass COVID-19 screenings, use hand sanitizer when entering and exiting the patient?s room and wear a mask at all times, including in the patient?s room. ?Patients must also wear a mask when staff or their visitor are in the room. ?Masking is required regardless of vaccination status.  ?

## 2022-01-26 ENCOUNTER — Other Ambulatory Visit
Admission: RE | Admit: 2022-01-26 | Discharge: 2022-01-26 | Disposition: A | Discharge status: Home / Self Care | Payer: Medicare Other | Source: Ambulatory Visit | Attending: Orthopedic Surgery | Admitting: Orthopedic Surgery

## 2022-01-26 ENCOUNTER — Other Ambulatory Visit: Payer: Self-pay

## 2022-01-26 VITALS — Ht 66.0 in | Wt 174.0 lb

## 2022-01-26 DIAGNOSIS — Z01818 Encounter for other preprocedural examination: Secondary | ICD-10-CM

## 2022-01-26 HISTORY — DX: Cerebral infarction, unspecified: I63.9

## 2022-01-26 NOTE — Patient Instructions (Addendum)
Your procedure is scheduled on: Tuesday 02/06/22 ?Report to the Registration Desk on the 1st floor of the Medical Mall. ?To find out your arrival time, please call [phone] between 1PM - 3PM on: 02/05/22 ? ?REMEMBER: ?Instructions that are not followed completely may result in serious medical risk, up to and including death; or upon the discretion of your surgeon and anesthesiologist your surgery may need to be rescheduled. ? ?Do not eat food after midnight the night before surgery.  ?No gum chewing, lozengers or hard candies. ? ?You may however, drink CLEAR liquids up to 2 hours before you are scheduled to arrive for your surgery. Do not drink anything within 2 hours of your scheduled arrival time. ? ?Clear liquids include: ?- water  ?- apple juice without pulp ?- gatorade (not RED colors) ?- black coffee or tea (Do NOT add milk or creamers to the coffee or tea) ?Do NOT drink anything that is not on this list. ? ?TAKE THESE MEDICATIONS THE MORNING OF SURGERY WITH A SIP OF WATER: ?gabapentin (NEURONTIN) 600 MG tablet ? ? ?One week prior to surgery: ?Stop Anti-inflammatories (NSAIDS) such as Advil, Aleve, Ibuprofen, Motrin, Naproxen, Naprosyn and Aspirin based products such as Excedrin, Goodys Powder, BC Powder. ? ?Stop ANY OVER THE COUNTER supplements until after surgery. ? ?You may however, continue to take Tylenol if needed for pain up until the day of surgery. ? ?No Alcohol for 24 hours before or after surgery. ? ?No Smoking including e-cigarettes for 24 hours prior to surgery.  ?No chewable tobacco products for at least 6 hours prior to surgery.  ?No nicotine patches on the day of surgery. ? ?Do not use any "recreational" drugs for at least a week prior to your surgery.  ?Please be advised that the combination of cocaine and anesthesia may have negative outcomes, up to and including death. ?If you test positive for cocaine, your surgery will be cancelled. ? ?On the morning of surgery brush your teeth with  toothpaste and water, you may rinse your mouth with mouthwash if you wish. ?Do not swallow any toothpaste or mouthwash. ? ?Use CHG Soap as directed on instruction sheet. ? ?Do not wear jewelry. ? ?Do not wear lotions, powders, or colognes.  ? ?Do not shave body from the neck down 48 hours prior to surgery just in case you cut yourself which could leave a site for infection.  ?Also, freshly shaved skin may become irritated if using the CHG soap. ? ?Do not bring valuables to the hospital. [facility] is not responsible for any missing/lost belongings or valuables.  ? ?Notify your doctor if there is any change in your medical condition (cold, fever, infection). ? ?Wear comfortable clothing (specific to your surgery type) to the hospital. ? ?If you are being admitted to the hospital overnight, leave your suitcase in the car. ?After surgery it may be brought to your room. ? ?If you are being discharged the day of surgery, you will not be allowed to drive home. ?You will need a responsible adult (18 years or older) to drive you home and stay with you that night.  ? ?If you are taking public transportation, you will need to have a responsible adult (18 years or older) with you. ?Please confirm with your physician that it is acceptable to use public transportation.  ? ?Please call the Pre-admissions Testing Dept. at [phone] if you have any questions about these instructions. ? ?Surgery Visitation Policy: ? ?Patients undergoing a surgery or procedure  may have one family member or support person with them as [place] as that person is not COVID-19 positive or experiencing its symptoms.  ?That person may remain in the waiting area during the procedure and may rotate out with other people. ? ?Inpatient Visitation:   ? ?Visiting hours are 7 a.m. to 8 p.m. ?Up to two visitors ages 16+ are allowed at one time in a patient room. The visitors may rotate out with other people during the day. Visitors must check out when they  leave, or other visitors will not be allowed. One designated support person may remain overnight. ?The visitor must pass COVID-19 screenings, use hand sanitizer when entering and exiting the patient?s room and wear a mask at all times, including in the patient?s room. ?Patients must also wear a mask when staff or their visitor are in the room. ?Masking is required regardless of vaccination status.  ?

## 2022-02-01 ENCOUNTER — Other Ambulatory Visit
Admission: RE | Admit: 2022-02-01 | Discharge: 2022-02-01 | Disposition: A | Discharge status: Home / Self Care | Payer: Medicare Other | Source: Ambulatory Visit | Attending: Orthopedic Surgery | Admitting: Orthopedic Surgery

## 2022-02-01 ENCOUNTER — Other Ambulatory Visit: Payer: Self-pay

## 2022-02-01 ENCOUNTER — Encounter: Payer: Self-pay | Admitting: Orthopedic Surgery

## 2022-02-01 DIAGNOSIS — Z01818 Encounter for other preprocedural examination: Secondary | ICD-10-CM

## 2022-02-01 DIAGNOSIS — Z01812 Encounter for preprocedural laboratory examination: Secondary | ICD-10-CM | POA: Diagnosis present

## 2022-02-01 DIAGNOSIS — S8001XA Contusion of right knee, initial encounter: Secondary | ICD-10-CM

## 2022-02-01 DIAGNOSIS — X58XXXA Exposure to other specified factors, initial encounter: Secondary | ICD-10-CM | POA: Diagnosis not present

## 2022-02-01 DIAGNOSIS — Z9889 Other specified postprocedural states: Secondary | ICD-10-CM

## 2022-02-01 LAB — URINE DRUG SCREEN, QUALITATIVE (ARMC ONLY)
Amphetamines, Ur Screen: NOT DETECTED
Barbiturates, Ur Screen: NOT DETECTED
Benzodiazepine, Ur Scrn: NOT DETECTED
Cannabinoid 50 Ng, Ur ~~LOC~~: POSITIVE — AB
Cocaine Metabolite,Ur ~~LOC~~: NOT DETECTED
MDMA (Ecstasy)Ur Screen: NOT DETECTED
Methadone Scn, Ur: NOT DETECTED
Opiate, Ur Screen: NOT DETECTED
Phencyclidine (PCP) Ur S: NOT DETECTED
Tricyclic, Ur Screen: NOT DETECTED

## 2022-02-01 LAB — CBC WITH DIFFERENTIAL/PLATELET
Abs Immature Granulocytes: 0.01 10*3/uL (ref 0.00–0.07)
Basophils Absolute: 0 10*3/uL (ref 0.0–0.1)
Basophils Relative: 0 %
Eosinophils Absolute: 0.2 10*3/uL (ref 0.0–0.5)
Eosinophils Relative: 5 %
HCT: 43.2 % (ref 39.0–52.0)
Hemoglobin: 13.6 g/dL (ref 13.0–17.0)
Immature Granulocytes: 0 %
Lymphocytes Relative: 35 %
Lymphs Abs: 1.6 10*3/uL (ref 0.7–4.0)
MCH: 26.9 pg (ref 26.0–34.0)
MCHC: 31.5 g/dL (ref 30.0–36.0)
MCV: 85.4 fL (ref 80.0–100.0)
Monocytes Absolute: 0.4 10*3/uL (ref 0.1–1.0)
Monocytes Relative: 8 %
Neutro Abs: 2.3 10*3/uL (ref 1.7–7.7)
Neutrophils Relative %: 52 %
Platelets: 320 10*3/uL (ref 150–400)
RBC: 5.06 MIL/uL (ref 4.22–5.81)
RDW: 14.6 % (ref 11.5–15.5)
WBC: 4.5 10*3/uL (ref 4.0–10.5)
nRBC: 0 % (ref 0.0–0.2)

## 2022-02-01 LAB — PROTIME-INR
INR: 1 (ref 0.8–1.2)
Prothrombin Time: 12.7 seconds (ref 11.4–15.2)

## 2022-02-01 LAB — URINALYSIS, ROUTINE W REFLEX MICROSCOPIC
Bilirubin Urine: NEGATIVE
Glucose, UA: NEGATIVE mg/dL
Hgb urine dipstick: NEGATIVE
Ketones, ur: NEGATIVE mg/dL
Leukocytes,Ua: NEGATIVE
Nitrite: NEGATIVE
Protein, ur: NEGATIVE mg/dL
Specific Gravity, Urine: 1.025 (ref 1.005–1.030)
pH: 6 (ref 5.0–8.0)

## 2022-02-01 LAB — TYPE AND SCREEN
ABO/RH(D): O POS
Antibody Screen: NEGATIVE

## 2022-02-01 LAB — BASIC METABOLIC PANEL WITH GFR
Anion gap: 8 (ref 5–15)
BUN: 17 mg/dL (ref 6–20)
CO2: 26 mmol/L (ref 22–32)
Calcium: 9.1 mg/dL (ref 8.9–10.3)
Chloride: 104 mmol/L (ref 98–111)
Creatinine, Ser: 1.13 mg/dL (ref 0.61–1.24)
GFR, Estimated: >60 mL/min
Glucose, Bld: 85 mg/dL (ref 70–99)
Potassium: 4 mmol/L (ref 3.5–5.1)
Sodium: 138 mmol/L (ref 135–145)

## 2022-02-01 LAB — APTT: aPTT: 35 seconds (ref 24–36)

## 2022-02-01 LAB — SURGICAL PCR SCREEN
MRSA, PCR: NEGATIVE
Staphylococcus aureus: NEGATIVE

## 2022-02-02 ENCOUNTER — Other Ambulatory Visit: Payer: Medicare Other

## 2022-02-05 MED ORDER — CHLORHEXIDINE GLUCONATE CLOTH 2 % EX PADS: 6.0000 | MEDICATED_PAD | Freq: Once | CUTANEOUS | Status: DC

## 2022-02-05 MED ORDER — FAMOTIDINE 20 MG PO TABS
20.0000 mg | ORAL_TABLET | Freq: Once | ORAL | Status: AC
  Administered 2022-02-06: 20 mg via ORAL

## 2022-02-05 MED ORDER — LACTATED RINGERS IV SOLN: INTRAVENOUS | Status: DC

## 2022-02-05 MED ORDER — ORAL CARE MOUTH RINSE: 15.0000 mL | Freq: Once | OROMUCOSAL | Status: AC

## 2022-02-05 MED ORDER — CHLORHEXIDINE GLUCONATE 0.12 % MT SOLN
15.0000 mL | Freq: Once | OROMUCOSAL | Status: AC
  Administered 2022-02-06: 15 mL via OROMUCOSAL

## 2022-02-06 ENCOUNTER — Encounter
Admission: RE | Disposition: A | Discharge status: Home / Self Care | Payer: Self-pay | Source: Home / Self Care | Attending: Orthopedic Surgery

## 2022-02-06 ENCOUNTER — Ambulatory Visit: Payer: Medicare Other | Admitting: Urgent Care

## 2022-02-06 ENCOUNTER — Observation Stay
Admission: RE | Admit: 2022-02-06 | Discharge: 2022-02-08 | Disposition: A | Discharge status: Home / Self Care | Payer: Medicare Other | Attending: Orthopedic Surgery | Admitting: Orthopedic Surgery

## 2022-02-06 ENCOUNTER — Ambulatory Visit: Payer: Medicare Other

## 2022-02-06 ENCOUNTER — Other Ambulatory Visit: Payer: Self-pay

## 2022-02-06 ENCOUNTER — Encounter: Payer: Self-pay | Admitting: Orthopedic Surgery

## 2022-02-06 DIAGNOSIS — Z79899 Other long term (current) drug therapy: Secondary | ICD-10-CM | POA: Diagnosis not present

## 2022-02-06 DIAGNOSIS — Z7982 Long term (current) use of aspirin: Secondary | ICD-10-CM | POA: Insufficient documentation

## 2022-02-06 DIAGNOSIS — F17210 Nicotine dependence, cigarettes, uncomplicated: Secondary | ICD-10-CM | POA: Insufficient documentation

## 2022-02-06 DIAGNOSIS — Z96651 Presence of right artificial knee joint: Secondary | ICD-10-CM

## 2022-02-06 DIAGNOSIS — Z8616 Personal history of COVID-19: Secondary | ICD-10-CM | POA: Insufficient documentation

## 2022-02-06 DIAGNOSIS — M1711 Unilateral primary osteoarthritis, right knee: Secondary | ICD-10-CM | POA: Diagnosis present

## 2022-02-06 DIAGNOSIS — I10 Essential (primary) hypertension: Secondary | ICD-10-CM | POA: Insufficient documentation

## 2022-02-06 HISTORY — DX: Poisoning by unspecified drugs, medicaments and biological substances, accidental (unintentional), initial encounter: T50.901A

## 2022-02-06 HISTORY — DX: Unilateral primary osteoarthritis, unspecified knee: M17.10

## 2022-02-06 HISTORY — DX: Cocaine use, unspecified with cocaine-induced psychotic disorder with hallucinations: F14.951

## 2022-02-06 HISTORY — DX: Other psychoactive substance abuse, uncomplicated: F19.10

## 2022-02-06 HISTORY — DX: Alcohol dependence, uncomplicated: F10.20

## 2022-02-06 HISTORY — DX: Major depressive disorder, single episode, unspecified: F32.9

## 2022-02-06 HISTORY — DX: Malingerer (conscious simulation): Z76.5

## 2022-02-06 HISTORY — DX: Homicidal ideations: R45.850

## 2022-02-06 HISTORY — PX: TOTAL KNEE ARTHROPLASTY: SHX125

## 2022-02-06 HISTORY — DX: Schizoaffective disorder, unspecified: F25.9

## 2022-02-06 LAB — URINE DRUG SCREEN, QUALITATIVE (ARMC ONLY)
Amphetamines, Ur Screen: NOT DETECTED
Barbiturates, Ur Screen: NOT DETECTED
Benzodiazepine, Ur Scrn: NOT DETECTED
Cannabinoid 50 Ng, Ur ~~LOC~~: NOT DETECTED
Cocaine Metabolite,Ur ~~LOC~~: NOT DETECTED
MDMA (Ecstasy)Ur Screen: NOT DETECTED
Methadone Scn, Ur: NOT DETECTED
Opiate, Ur Screen: NOT DETECTED
Phencyclidine (PCP) Ur S: NOT DETECTED
Tricyclic, Ur Screen: NOT DETECTED

## 2022-02-06 LAB — CBC
HCT: 41.6 % (ref 39.0–52.0)
Hemoglobin: 13.2 g/dL (ref 13.0–17.0)
MCH: 27 pg (ref 26.0–34.0)
MCHC: 31.7 g/dL (ref 30.0–36.0)
MCV: 85.2 fL (ref 80.0–100.0)
Platelets: 274 10*3/uL (ref 150–400)
RBC: 4.88 MIL/uL (ref 4.22–5.81)
RDW: 14.2 % (ref 11.5–15.5)
WBC: 9.4 10*3/uL (ref 4.0–10.5)
nRBC: 0 % (ref 0.0–0.2)

## 2022-02-06 LAB — CREATININE, SERUM
Creatinine, Ser: 1.12 mg/dL (ref 0.61–1.24)
GFR, Estimated: >60 mL/min (ref >60)

## 2022-02-06 LAB — ABO/RH: ABO/RH(D): O POS

## 2022-02-06 SURGERY — ARTHROPLASTY, KNEE, TOTAL
Anesthesia: Spinal | Site: Knee | Laterality: Right

## 2022-02-06 MED ORDER — CEFAZOLIN SODIUM-DEXTROSE 2-4 GM/100ML-% IV SOLN
2.0000 g | INTRAVENOUS | Status: AC
  Administered 2022-02-06: 2 g via INTRAVENOUS

## 2022-02-06 MED ORDER — ONDANSETRON HCL 4 MG/2ML IJ SOLN
INTRAMUSCULAR | Status: AC
  Filled 2022-02-06: qty 2

## 2022-02-06 MED ORDER — OXYCODONE HCL 5 MG PO TABS: 5.0000 mg | ORAL_TABLET | Freq: Once | ORAL | Status: DC | PRN

## 2022-02-06 MED ORDER — PROPOFOL 500 MG/50ML IV EMUL
INTRAVENOUS | Status: DC | PRN
  Administered 2022-02-06: 125 ug/kg/min via INTRAVENOUS

## 2022-02-06 MED ORDER — BISACODYL 5 MG PO TBEC: 10.0000 mg | DELAYED_RELEASE_TABLET | Freq: Every day | ORAL | Status: DC | PRN

## 2022-02-06 MED ORDER — TRANEXAMIC ACID-NACL 1000-0.7 MG/100ML-% IV SOLN
INTRAVENOUS | Status: AC
  Filled 2022-02-06: qty 100

## 2022-02-06 MED ORDER — CEFAZOLIN SODIUM-DEXTROSE 2-4 GM/100ML-% IV SOLN
INTRAVENOUS | Status: AC
  Filled 2022-02-06: qty 100

## 2022-02-06 MED ORDER — MIDAZOLAM HCL 5 MG/5ML IJ SOLN
INTRAMUSCULAR | Status: DC | PRN
  Administered 2022-02-06 (×2): 1 mg via INTRAVENOUS

## 2022-02-06 MED ORDER — STERILE WATER FOR IRRIGATION IR SOLN
Status: DC | PRN
  Administered 2022-02-06: 1000 mL

## 2022-02-06 MED ORDER — DOCUSATE SODIUM 100 MG PO CAPS
100.0000 mg | ORAL_CAPSULE | Freq: Two times a day (BID) | ORAL | Status: DC
  Administered 2022-02-06 – 2022-02-08 (×4): 100 mg via ORAL
  Filled 2022-02-06 (×4): qty 1

## 2022-02-06 MED ORDER — SENNOSIDES-DOCUSATE SODIUM 8.6-50 MG PO TABS: 1.0000 | ORAL_TABLET | Freq: Every evening | ORAL | Status: DC | PRN

## 2022-02-06 MED ORDER — TRAMADOL HCL 50 MG PO TABS
50.0000 mg | ORAL_TABLET | Freq: Four times a day (QID) | ORAL | Status: DC
  Administered 2022-02-06 – 2022-02-08 (×8): 50 mg via ORAL
  Filled 2022-02-06 (×8): qty 1

## 2022-02-06 MED ORDER — SODIUM CHLORIDE 0.9 % IV SOLN: INTRAVENOUS | Status: DC

## 2022-02-06 MED ORDER — PHENYLEPHRINE HCL-NACL 20-0.9 MG/250ML-% IV SOLN
INTRAVENOUS | Status: DC | PRN
  Administered 2022-02-06: 35 ug/min via INTRAVENOUS

## 2022-02-06 MED ORDER — CLINDAMYCIN PHOSPHATE 600 MG/50ML IV SOLN
600.0000 mg | Freq: Once | INTRAVENOUS | Status: AC
  Administered 2022-02-06: 600 mg via INTRAVENOUS

## 2022-02-06 MED ORDER — BUPIVACAINE LIPOSOME 1.3 % IJ SUSP
INTRAMUSCULAR | Status: AC
  Filled 2022-02-06: qty 20

## 2022-02-06 MED ORDER — PROPOFOL 1000 MG/100ML IV EMUL
INTRAVENOUS | Status: AC
Start: 2022-02-06 — End: ?
  Filled 2022-02-06: qty 100

## 2022-02-06 MED ORDER — ACETAMINOPHEN 325 MG PO TABS: 325.0000 mg | ORAL_TABLET | Freq: Four times a day (QID) | ORAL | Status: DC | PRN

## 2022-02-06 MED ORDER — PROPOFOL 1000 MG/100ML IV EMUL
INTRAVENOUS | Status: AC
  Filled 2022-02-06: qty 100

## 2022-02-06 MED ORDER — OXYCODONE HCL 5 MG/5ML PO SOLN: 5.0000 mg | Freq: Once | ORAL | Status: DC | PRN

## 2022-02-06 MED ORDER — OXYCODONE HCL 5 MG PO TABS
5.0000 mg | ORAL_TABLET | ORAL | Status: DC | PRN
  Administered 2022-02-08: 5 mg via ORAL
  Filled 2022-02-06 (×2): qty 2

## 2022-02-06 MED ORDER — ONDANSETRON HCL 4 MG PO TABS
4.0000 mg | ORAL_TABLET | Freq: Four times a day (QID) | ORAL | Status: DC | PRN
  Administered 2022-02-07: 4 mg via ORAL
  Filled 2022-02-06: qty 1

## 2022-02-06 MED ORDER — DEXMEDETOMIDINE (PRECEDEX) IN NS 20 MCG/5ML (4 MCG/ML) IV SYRINGE
PREFILLED_SYRINGE | INTRAVENOUS | Status: DC | PRN
  Administered 2022-02-06: 6 ug via INTRAVENOUS
  Administered 2022-02-06: 10 ug via INTRAVENOUS

## 2022-02-06 MED ORDER — HYDROMORPHONE HCL 1 MG/ML IJ SOLN
0.5000 mg | INTRAMUSCULAR | Status: DC | PRN
  Administered 2022-02-06 – 2022-02-07 (×2): 1 mg via INTRAVENOUS
  Filled 2022-02-06 (×2): qty 1

## 2022-02-06 MED ORDER — ACETAMINOPHEN 10 MG/ML IV SOLN
INTRAVENOUS | Status: DC | PRN
Start: 2022-02-06 — End: 2022-02-06
  Administered 2022-02-06: 1000 mg via INTRAVENOUS

## 2022-02-06 MED ORDER — LISINOPRIL 20 MG PO TABS
40.0000 mg | ORAL_TABLET | Freq: Every day | ORAL | Status: DC
  Administered 2022-02-07 – 2022-02-08 (×2): 40 mg via ORAL
  Filled 2022-02-06 (×2): qty 2

## 2022-02-06 MED ORDER — ONDANSETRON HCL 4 MG/2ML IJ SOLN: 4.0000 mg | Freq: Once | INTRAMUSCULAR | Status: DC | PRN

## 2022-02-06 MED ORDER — LIDOCAINE HCL (CARDIAC) PF 100 MG/5ML IV SOSY
PREFILLED_SYRINGE | INTRAVENOUS | Status: DC | PRN
  Administered 2022-02-06: 100 mg via INTRAVENOUS

## 2022-02-06 MED ORDER — NEOMYCIN-POLYMYXIN B GU 40-200000 IR SOLN
Status: DC | PRN
  Administered 2022-02-06: 16 mL

## 2022-02-06 MED ORDER — TRANEXAMIC ACID-NACL 1000-0.7 MG/100ML-% IV SOLN
1000.0000 mg | INTRAVENOUS | Status: AC
  Administered 2022-02-06: 1000 mg via INTRAVENOUS

## 2022-02-06 MED ORDER — DIPHENHYDRAMINE HCL 12.5 MG/5ML PO ELIX: 12.5000 mg | ORAL_SOLUTION | ORAL | Status: DC | PRN

## 2022-02-06 MED ORDER — FENTANYL CITRATE (PF) 100 MCG/2ML IJ SOLN
INTRAMUSCULAR | Status: AC
  Filled 2022-02-06: qty 2

## 2022-02-06 MED ORDER — CEFAZOLIN SODIUM-DEXTROSE 2-4 GM/100ML-% IV SOLN
2.0000 g | Freq: Four times a day (QID) | INTRAVENOUS | Status: AC
  Administered 2022-02-06 (×2): 2 g via INTRAVENOUS
  Filled 2022-02-06 (×2): qty 100

## 2022-02-06 MED ORDER — CHLORHEXIDINE GLUCONATE 0.12 % MT SOLN
OROMUCOSAL | Status: AC
  Filled 2022-02-06: qty 15

## 2022-02-06 MED ORDER — ACETAMINOPHEN 10 MG/ML IV SOLN
INTRAVENOUS | Status: AC
  Filled 2022-02-06: qty 100

## 2022-02-06 MED ORDER — KETOROLAC TROMETHAMINE 30 MG/ML IJ SOLN
INTRAMUSCULAR | Status: AC
Start: 2022-02-06 — End: ?
  Filled 2022-02-06: qty 1

## 2022-02-06 MED ORDER — SODIUM CHLORIDE FLUSH 0.9 % IV SOLN
INTRAVENOUS | Status: AC
  Filled 2022-02-06: qty 40

## 2022-02-06 MED ORDER — LIDOCAINE HCL (PF) 2 % IJ SOLN
INTRAMUSCULAR | Status: AC
  Filled 2022-02-06: qty 5

## 2022-02-06 MED ORDER — CLINDAMYCIN PHOSPHATE 600 MG/50ML IV SOLN
INTRAVENOUS | Status: AC
  Filled 2022-02-06: qty 50

## 2022-02-06 MED ORDER — FAMOTIDINE 20 MG PO TABS
ORAL_TABLET | ORAL | Status: AC
  Filled 2022-02-06: qty 1

## 2022-02-06 MED ORDER — SODIUM CHLORIDE (PF) 0.9 % IJ SOLN
INTRAMUSCULAR | Status: DC | PRN
  Administered 2022-02-06: 121 mL via INTRA_ARTICULAR

## 2022-02-06 MED ORDER — NICOTINE 21 MG/24HR TD PT24
21.0000 mg | MEDICATED_PATCH | Freq: Every day | TRANSDERMAL | Status: DC
  Administered 2022-02-06 – 2022-02-08 (×3): 21 mg via TRANSDERMAL
  Filled 2022-02-06 (×3): qty 1

## 2022-02-06 MED ORDER — PHENYLEPHRINE 40 MCG/ML (10ML) SYRINGE FOR IV PUSH (FOR BLOOD PRESSURE SUPPORT)
PREFILLED_SYRINGE | INTRAVENOUS | Status: DC | PRN
  Administered 2022-02-06 (×3): 80 ug via INTRAVENOUS

## 2022-02-06 MED ORDER — METHOCARBAMOL 500 MG PO TABS
500.0000 mg | ORAL_TABLET | Freq: Four times a day (QID) | ORAL | Status: DC | PRN
  Administered 2022-02-08: 500 mg via ORAL
  Filled 2022-02-06: qty 1

## 2022-02-06 MED ORDER — SODIUM CHLORIDE 0.9 % IR SOLN
Status: DC | PRN
  Administered 2022-02-06: 3000 mL

## 2022-02-06 MED ORDER — BUPIVACAINE HCL (PF) 0.5 % IJ SOLN
INTRAMUSCULAR | Status: DC | PRN
  Administered 2022-02-06: 2.6 mL via INTRATHECAL

## 2022-02-06 MED ORDER — PROPOFOL 10 MG/ML IV BOLUS
INTRAVENOUS | Status: DC | PRN
Start: 2022-02-06 — End: 2022-02-06
  Administered 2022-02-06: 30 mg via INTRAVENOUS

## 2022-02-06 MED ORDER — DEXAMETHASONE SODIUM PHOSPHATE 10 MG/ML IJ SOLN
INTRAMUSCULAR | Status: DC | PRN
  Administered 2022-02-06: 10 mg via INTRAVENOUS

## 2022-02-06 MED ORDER — BUPIVACAINE-EPINEPHRINE (PF) 0.25% -1:200000 IJ SOLN
INTRAMUSCULAR | Status: AC
  Filled 2022-02-06: qty 30

## 2022-02-06 MED ORDER — ONDANSETRON HCL 4 MG/2ML IJ SOLN: 4.0000 mg | Freq: Four times a day (QID) | INTRAMUSCULAR | Status: DC | PRN

## 2022-02-06 MED ORDER — ENOXAPARIN SODIUM 40 MG/0.4ML IJ SOSY
40.0000 mg | PREFILLED_SYRINGE | INTRAMUSCULAR | Status: DC
  Administered 2022-02-07 – 2022-02-08 (×2): 40 mg via SUBCUTANEOUS
  Filled 2022-02-06 (×2): qty 0.4

## 2022-02-06 MED ORDER — FENTANYL CITRATE (PF) 100 MCG/2ML IJ SOLN: 25.0000 ug | INTRAMUSCULAR | Status: DC | PRN

## 2022-02-06 MED ORDER — NEOMYCIN-POLYMYXIN B GU 40-200000 IR SOLN
Status: AC
  Filled 2022-02-06: qty 20

## 2022-02-06 MED ORDER — 0.9 % SODIUM CHLORIDE (POUR BTL) OPTIME
TOPICAL | Status: DC | PRN
Start: 2022-02-06 — End: 2022-02-06
  Administered 2022-02-06: 1000 mL

## 2022-02-06 MED ORDER — OXYCODONE HCL 5 MG PO TABS
10.0000 mg | ORAL_TABLET | ORAL | Status: DC | PRN
  Administered 2022-02-06: 15 mg via ORAL
  Administered 2022-02-06: 10 mg via ORAL
  Administered 2022-02-07 – 2022-02-08 (×5): 15 mg via ORAL
  Filled 2022-02-06 (×6): qty 3

## 2022-02-06 MED ORDER — METHOCARBAMOL 1000 MG/10ML IJ SOLN
500.0000 mg | Freq: Four times a day (QID) | INTRAVENOUS | Status: DC | PRN
  Filled 2022-02-06: qty 5

## 2022-02-06 MED ORDER — ACETAMINOPHEN 500 MG PO TABS
1000.0000 mg | ORAL_TABLET | Freq: Four times a day (QID) | ORAL | Status: AC
  Administered 2022-02-06 – 2022-02-07 (×4): 1000 mg via ORAL
  Filled 2022-02-06 (×4): qty 2

## 2022-02-06 MED ORDER — MORPHINE SULFATE (PF) 4 MG/ML IV SOLN
INTRAVENOUS | Status: AC
  Filled 2022-02-06: qty 1

## 2022-02-06 MED ORDER — ACETAMINOPHEN 10 MG/ML IV SOLN: 1000.0000 mg | Freq: Once | INTRAVENOUS | Status: DC | PRN

## 2022-02-06 MED ORDER — GABAPENTIN 600 MG PO TABS
600.0000 mg | ORAL_TABLET | Freq: Three times a day (TID) | ORAL | Status: DC
  Administered 2022-02-06 – 2022-02-08 (×6): 600 mg via ORAL
  Filled 2022-02-06 (×6): qty 1

## 2022-02-06 MED ORDER — FENTANYL CITRATE (PF) 100 MCG/2ML IJ SOLN
INTRAMUSCULAR | Status: DC | PRN
  Administered 2022-02-06 (×2): 25 ug via INTRAVENOUS
  Administered 2022-02-06: 50 ug via INTRAVENOUS

## 2022-02-06 MED ORDER — MIDAZOLAM HCL 2 MG/2ML IJ SOLN
INTRAMUSCULAR | Status: AC
  Filled 2022-02-06: qty 2

## 2022-02-06 MED ORDER — FLEET ENEMA 7-19 GM/118ML RE ENEM: 1.0000 | ENEMA | Freq: Once | RECTAL | Status: DC | PRN

## 2022-02-06 MED ORDER — ONDANSETRON HCL 4 MG/2ML IJ SOLN
INTRAMUSCULAR | Status: DC | PRN
  Administered 2022-02-06: 4 mg via INTRAVENOUS

## 2022-02-06 SURGICAL SUPPLY — 74 items
BLADE SAW 90X13X1.19 OSCILLAT (BLADE) ×2 IMPLANT
BLADE SAW 90X25X1.19 OSCILLAT (BLADE) ×2 IMPLANT
CEMENT HV SMART SET (Cement) ×4 IMPLANT
CEMENT TIBIA MBT SIZE 4 (Knees) IMPLANT
CNTNR SPEC 2.5X3XGRAD LEK (MISCELLANEOUS) ×1
CONT SPEC 4OZ STER OR WHT (MISCELLANEOUS) ×1
CONT SPEC 4OZ STRL OR WHT (MISCELLANEOUS) ×1
CONTAINER SPEC 2.5X3XGRAD LEK (MISCELLANEOUS) ×1 IMPLANT
COOLER POLAR GLACIER W/PUMP (MISCELLANEOUS) ×2 IMPLANT
CUFF TOURN SGL QUICK 24 (TOURNIQUET CUFF)
CUFF TOURN SGL QUICK 34 (TOURNIQUET CUFF) ×2
CUFF TRNQT CYL 24X4X16.5-23 (TOURNIQUET CUFF) IMPLANT
CUFF TRNQT CYL 34X4.125X (TOURNIQUET CUFF) IMPLANT
DRAPE 3/4 80X56 (DRAPES) ×4 IMPLANT
DRAPE IMP U-DRAPE 54X76 (DRAPES) ×4 IMPLANT
DRAPE INCISE IOBAN 66X60 STRL (DRAPES) ×2 IMPLANT
DRAPE SURG 17X11 SM STRL (DRAPES) ×4 IMPLANT
DRSG AQUACEL AG ADV 3.5X10 (GAUZE/BANDAGES/DRESSINGS) ×2 IMPLANT
DRSG MEPILEX SACRM 8.7X9.8 (GAUZE/BANDAGES/DRESSINGS) ×2 IMPLANT
DURAPREP 26ML APPLICATOR (WOUND CARE) ×8 IMPLANT
ELECT REM PT RETURN 9FT ADLT (ELECTROSURGICAL) ×2
ELECTRODE REM PT RTRN 9FT ADLT (ELECTROSURGICAL) ×1 IMPLANT
FEMUR SIGMA PS SZ 4.0 R (Femur) ×1 IMPLANT
GAUZE SPONGE 4X4 12PLY STRL (GAUZE/BANDAGES/DRESSINGS) ×2 IMPLANT
GLOVE SURG ORTHO LTX SZ9 (GLOVE) ×4 IMPLANT
GLOVE SURG SYN 7.5  E (GLOVE) ×2
GLOVE SURG SYN 7.5 E (GLOVE) ×1 IMPLANT
GLOVE SURG SYN 7.5 PF PI (GLOVE) ×1 IMPLANT
GLOVE SURG UNDER POLY LF SZ7.5 (GLOVE) ×2 IMPLANT
GLOVE SURG UNDER POLY LF SZ9 (GLOVE) ×2 IMPLANT
GOWN STRL REUS TWL 2XL XL LVL4 (GOWN DISPOSABLE) ×2 IMPLANT
GOWN STRL REUS W/ TWL LRG LVL3 (GOWN DISPOSABLE) ×1 IMPLANT
GOWN STRL REUS W/ TWL LRG LVL4 (GOWN DISPOSABLE) ×1 IMPLANT
GOWN STRL REUS W/ TWL XL LVL3 (GOWN DISPOSABLE) ×1 IMPLANT
GOWN STRL REUS W/TWL LRG LVL3 (GOWN DISPOSABLE) ×2
GOWN STRL REUS W/TWL LRG LVL4 (GOWN DISPOSABLE) ×2
GOWN STRL REUS W/TWL XL LVL3 (GOWN DISPOSABLE) ×2
HOLDER FOLEY CATH W/STRAP (MISCELLANEOUS) ×2 IMPLANT
IMMBOLIZER KNEE 19 BLUE UNIV (SOFTGOODS) ×2 IMPLANT
IV NS IRRIG 3000ML ARTHROMATIC (IV SOLUTION) ×2 IMPLANT
KIT TURNOVER KIT A (KITS) ×2 IMPLANT
MANIFOLD NEPTUNE II (INSTRUMENTS) ×3 IMPLANT
NDL SAFETY ECLIPSE 18X1.5 (NEEDLE) ×1 IMPLANT
NDL SPNL 20GX3.5 QUINCKE YW (NEEDLE) ×1 IMPLANT
NEEDLE HYPO 18GX1.5 SHARP (NEEDLE) ×2
NEEDLE HYPO 22GX1.5 SAFETY (NEEDLE) ×2 IMPLANT
NEEDLE SPNL 20GX3.5 QUINCKE YW (NEEDLE) ×2 IMPLANT
NS IRRIG 1000ML POUR BTL (IV SOLUTION) ×2 IMPLANT
PACK TOTAL KNEE (MISCELLANEOUS) ×2 IMPLANT
PAD ABD DERMACEA PRESS 5X9 (GAUZE/BANDAGES/DRESSINGS) ×4 IMPLANT
PAD WRAPON POLAR KNEE (MISCELLANEOUS) ×1 IMPLANT
PATELLA DOME PFC 38MM (Knees) ×1 IMPLANT
PENCIL SMOKE EVACUATOR COATED (MISCELLANEOUS) ×2 IMPLANT
PIN DRILL FIX HALF THREAD (BIT) ×4 IMPLANT
PIN DRILL QUICK PACK ×4 IMPLANT
PIN FIXATION 1/8DIA X 3INL (PIN) ×2 IMPLANT
PLATE ROT INSERT 10MM SIZE 4 (Plate) ×1 IMPLANT
PULSAVAC PLUS IRRIG FAN TIP (DISPOSABLE) ×2
STAPLER SKIN PROX 35W (STAPLE) ×2 IMPLANT
SUCTION FRAZIER HANDLE 10FR (MISCELLANEOUS) ×2
SUCTION TUBE FRAZIER 10FR DISP (MISCELLANEOUS) ×1 IMPLANT
SUT ETHIBOND NAB CT1 #1 30IN (SUTURE) ×5 IMPLANT
SUT VIC AB 0 CT1 36 (SUTURE) ×2 IMPLANT
SUT VIC AB 2-0 CT1 (SUTURE) ×4 IMPLANT
SYR 20ML LL LF (SYRINGE) ×2 IMPLANT
SYR 30ML LL (SYRINGE) ×4 IMPLANT
TIBIA MBT CEMENT SIZE 4 (Knees) ×2 IMPLANT
TIP FAN IRRIG PULSAVAC PLUS (DISPOSABLE) ×1 IMPLANT
TOWER CARTRIDGE SMART MIX (DISPOSABLE) ×2 IMPLANT
TRAY FOLEY MTR SLVR 16FR STAT (SET/KITS/TRAYS/PACK) ×2 IMPLANT
TUBE SUCT KAM VAC (TUBING) ×2 IMPLANT
WATER STERILE IRR 1000ML POUR (IV SOLUTION) ×1 IMPLANT
WATER STERILE IRR 500ML POUR (IV SOLUTION) ×1 IMPLANT
WRAPON POLAR PAD KNEE (MISCELLANEOUS) ×2

## 2022-02-06 NOTE — H&P (Signed)
PREOPERATIVE H&P ? ?Chief Complaint: Right Knee post-traumatic Osteoarthritis ? ?HPI: ?[name] [name] is a 61 y.o. male who presents for preoperative history and physical with a diagnosis of Right Knee post-traumatic osteoarthritis. Symptoms of pain, effusion and limited ROM are significantly impairing activities of daily living.  Patient has failed nonoperative management wished to proceed with a right total knee arthroplasty. ? ?Past Medical History:  ?Diagnosis Date  ? Accidental drug overdose   ? Arthritis of knee   ? Bipolar 1 disorder (HCC)   ? 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HCC)   ? History of 2019 novel coronavirus disease (COVID-19) 07/02/2021  ? Homicidal ideation   ? Hypertension   ? Malingering   ? MDD (major depressive disorder)   ? Polysubstance abuse (HCC)   ? a.) THC, cocaine, heroin, ETOH, tobacco. b.) "died" for 3 minutes following use of cocaine that was laced with fentanyl  ? Schizoaffective disorder (HCC)   ? Schizophrenia (HCC)   ? Severe alcohol use disorder (HCC)   ? Stroke [facility])   ? 2019  ? Suicidal ideation 04/13/2015  ? a.) SI (+) plan to "jump in front of a train"  ? ?Past Surgical History:  ?Procedure Laterality Date  ? CHOLECYSTECTOMY N/A 12/14/2014  ? Procedure: LAPAROSCOPIC CHOLECYSTECTOMY WITH INTRAOPERATIVE CHOLANGIOGRAM;  Surgeon: Harriette Bouillon, MD;  Location: [facility] OR;  Service: General;  Laterality: N/A;  ? HERNIA REPAIR    ? LEFT HEART CATH AND CORONARY ANGIOGRAPHY N/A 08/05/2018  ? Procedure: LEFT HEART CATH AND CORONARY ANGIOGRAPHY;  Surgeon: Corky Crafts, MD;  Location: [facility] INVASIVE CV LAB;  Service: Cardiovascular;  Laterality: N/A;  ? NO PAST SURGERIES    ? ?Social History  ? ?Socioeconomic History  ? Marital status: Single  ?  Spouse name: Not on file  ? Number of children: 1  ? Years of education: Not on file  ? Highest education level: Not on file  ?Occupational History  ? Not on file  ?Tobacco Use  ? Smoking status: Every Day  ?  Packs/day: 1.00   ?  Years: 35.00  ?  Pack years: 35.00  ?  Types: Cigarettes  ? Smokeless tobacco: Never  ?Substance and Sexual Activity  ? Alcohol use: Yes  ?  Alcohol/week: 2.0 standard drinks  ?  Types: 2 Cans of beer per week  ? Drug use: Not Currently  ?  Types: Marijuana, Cocaine  ?  Comment: pt states quit 2 years ago  ? Sexual activity: Not on file  ?Other Topics Concern  ? Not on file  ?Social History Narrative  ? Not on file  ? ?Social Determinants of Health  ? ?Financial Resource Strain: Not on file  ?Food Insecurity: Not on file  ?Transportation Needs: Not on file  ?Physical Activity: Not on file  ?Stress: Not on file  ?Social Connections: Not on file  ? ?Family History  ?Problem Relation Age of Onset  ? Heart attack Mother 39  ? Heart attack Father 13  ? ?No Known Allergies ?Prior to Admission medications   ?Medication Sig Start Date End Date Taking? Authorizing Provider  ?gabapentin (NEURONTIN) 600 MG tablet Take 600 mg by mouth 3 (three) times daily. 12/18/21  Yes [provider]  ?lisinopril (ZESTRIL) 40 MG tablet Take 40 mg by mouth daily. 12/18/21  Yes [provider]  ?amLODipine (NORVASC) 5 MG tablet Take 1 tablet (5 mg total) by mouth daily. For high blood pressure ?Patient not taking: Reported on 01/19/2022 09/16/18  Armandina StammerNwoko, Agnes I, NP  ?aspirin 81 MG EC tablet Take 1 tablet (81 mg total) by mouth daily. (May buy from over the counter): For heart health ?Patient not taking: Reported on 01/19/2022 09/16/18   Armandina StammerNwoko, Agnes I, NP  ?atorvastatin (LIPITOR) 20 MG tablet Take 1 tablet (20 mg total) by mouth at bedtime. For high cholesterol ?Patient not taking: Reported on 07/03/2021 09/16/18   Armandina StammerNwoko, Agnes I, NP  ?clopidogrel (PLAVIX) 75 MG tablet Take 1 tablet (75 mg total) by mouth daily. For prevention of blood clot ?Patient not taking: Reported on 07/03/2021 09/17/18   Armandina StammerNwoko, Agnes I, NP  ?FLUoxetine (PROZAC) 10 MG capsule Take 1 capsule (10 mg total) by mouth daily. For depression ?Patient not taking:  Reported on 01/19/2022 09/16/18   Armandina StammerNwoko, Agnes I, NP  ?hydrOXYzine (ATARAX/VISTARIL) 25 MG tablet Take 1 tablet (25 mg total) by mouth 3 (three) times daily as needed for anxiety. ?Patient not taking: Reported on 07/03/2021 09/16/18   Armandina StammerNwoko, Agnes I, NP  ?nicotine (NICODERM CQ - DOSED IN MG/24 HOURS) 21 mg/24hr patch Place 1 patch (21 mg total) onto the skin daily. (May buy from over the counter): For smoking cessation ?Patient not taking: Reported on 07/03/2021 09/17/18   Armandina StammerNwoko, Agnes I, NP  ?QUEtiapine (SEROQUEL) 100 MG tablet Take 1 tablet (100 mg total) by mouth 3 (three) times daily. For agitation/mood control ?Patient not taking: Reported on 07/03/2021 09/16/18   Armandina StammerNwoko, Agnes I, NP  ?traZODone (DESYREL) 50 MG tablet Take 1 tablet (50 mg total) by mouth at bedtime as needed for sleep. ?Patient not taking: Reported on 07/03/2021 09/16/18   Armandina StammerNwoko, Agnes I, NP  ? ? ? ?Positive ROS: All other systems have been reviewed and were otherwise negative with the exception of those mentioned in the HPI and as above. ? ?Physical Exam: ?General: Alert, no acute distress ?Cardiovascular: Regular rate and rhythm, no murmurs rubs or gallops.  No pedal edema ?Respiratory: Clear to auscultation bilaterally, no wheezes rales or rhonchi. No cyanosis, no use of accessory musculature ?GI: No organomegaly, abdomen is soft and non-tender nondistended with positive bowel sounds. ?Skin: Skin intact, no lesions within the operative field. ?Neurologic: Sensation intact distally ?Psychiatric: Patient is competent for consent with normal mood and affect ?Lymphatic: No cervical lymphadenopathy ? ?MUSCULOSKELETAL: Right knee: Patient has a varus deformity.  His skin is intact.  He has a moderate effusion without erythema or ecchymosis.  His range of motion is from -5 to 10 degrees full extension to approximately 90 degrees of flexion.  Patient is tenderness over the medial joint line.  He has no ligamentous laxity.  His varus deformity is partially  passively correctable. ? ?Assessment: ?Right Knee posttraumatic osteoarthritis ? ?Plan: ?Plan for Procedure(s): ?RIGHT TOTAL KNEE ARTHROPLASTY ? ?I reviewed the details of the operation as well as the postoperative course with the patient.  Preop history and physical was performed at the bedside.  I marked the right knee according to hospital's quickset surgery protocol.  Answered all his questions. ? ?I discussed the risks and benefits of surgery. The risks include but are not limited to infection, bleeding requiring blood transfusion, nerve or blood vessel injury, joint stiffness or loss of motion, persistent pain, weakness or instability,  and the need for further surgery. Medical risks include but are not limited to DVT and pulmonary embolism, myocardial infarction, stroke, pneumonia, respiratory failure and death. Patient understood these risks and wished to proceed.  ? ? ? ?Juanell FairlyKevin Randale Carvalho, MD ? ? ?02/06/2022 ?  8:27 AM ?  ?

## 2022-02-06 NOTE — Progress Notes (Signed)
1616 ?Pt smokes everyday. Smoked this morning prior to surgery. Verbal order from dr Susette Racer for nicotine patch 21mg  daily ?

## 2022-02-06 NOTE — Progress Notes (Signed)
?  Subjective: ? ?POST OP CHECK s/p right total knee arthroplasty.   Patient reports right knee pain as moderate.  Patient complains of pain over the right distal quadriceps.  Spinal block is wearing off. ? ?Objective:  ? ?VITALS:   ?Vitals:  ? 02/06/22 1430 02/06/22 1445 02/06/22 1500 02/06/22 1526  ?BP: (!) 141/86 139/77 (!) 133/92 (!) 156/97  ?Pulse: 68 64 63 (!) 55  ?Resp: (!) 23 15 16 18   ?Temp:   (!) 97.3 ?F (36.3 ?C) 97.7 ?F (36.5 ?C)  ?TempSrc:      ?SpO2: 100% 98% 99% 100%  ?Weight:      ?Height:      ? ? ?PHYSICAL EXAM: ?Right lower extremity: ?Neurovascular intact ?Sensation intact distally ?Intact pulses distally ?Dorsiflexion/Plantar flexion intact ?Incision: dressing C/D/I ?Compartment soft ? ?LABS ? ?Results for orders placed or performed during the hospital encounter of 02/06/22 (from the past 24 hour(s))  ?Urine Drug Screen, Qualitative ([facility] only)     Status: None  ? Collection Time: 02/06/22  7:23 AM  ?Result Value Ref Range  ? Tricyclic, Ur Screen NONE DETECTED NONE DETECTED  ? Amphetamines, Ur Screen NONE DETECTED NONE DETECTED  ? MDMA (Ecstasy)Ur Screen NONE DETECTED NONE DETECTED  ? Cocaine Metabolite,Ur [place] NONE DETECTED NONE DETECTED  ? Opiate, Ur Screen NONE DETECTED NONE DETECTED  ? Phencyclidine (PCP) Ur S NONE DETECTED NONE DETECTED  ? Cannabinoid 50 Ng, Ur [place] NONE DETECTED NONE DETECTED  ? Barbiturates, Ur Screen NONE DETECTED NONE DETECTED  ? Benzodiazepine, Ur Scrn NONE DETECTED NONE DETECTED  ? Methadone Scn, Ur NONE DETECTED NONE DETECTED  ?ABO/Rh     Status: None  ? Collection Time: 02/06/22  8:33 AM  ?Result Value Ref Range  ? ABO/RH(D)    ?  O POS ?Performed at [facility], [address], [place], [place] ?  ? ? ?DG Knee Right Port ? ?Result Date: 02/06/2022 ?CLINICAL DATA:  Right knee replacement EXAM: PORTABLE RIGHT KNEE - 1-2 VIEW COMPARISON:  None. FINDINGS: Right knee demonstrates a total knee arthroplasty without evidence of hardware failure complication.  No significant joint effusion. No fracture or dislocation. Alignment is anatomic. Surgical drains are present. Post-surgical changes noted in the surrounding soft tissues. Peripheral vascular atherosclerotic disease. IMPRESSION: 1. Interval right total knee arthroplasty. Electronically Signed   By: 02/08/2022 M.D.   On: 02/06/2022 13:06   ? ?Assessment/Plan: ?Day of Surgery  ? ?Principal Problem: ?  S/P TKR (total knee replacement) using cement, right ? ?Patient is stable postop.  Patient will complete 24 hours of postop antibiotics.  Continue right lower extremity elevation and Polar Care.  Patient to wear immobilizer when not working with physical therapy.  Continue physical therapy tomorrow.  Labs will be checked in the morning.  Foley catheter will be removed in the morning.  I have reviewed the patient's postoperative x-rays which demonstrate the total knee arthroplasty components are well-positioned and there is no evidence of postop complication. ? ? ?02/08/2022 , MD ?02/06/2022, 5:31 PM ? ? ? ? ?  ?

## 2022-02-06 NOTE — Progress Notes (Signed)
PT Cancellation Note ? ?Patient Details ?Name: [name] [name] ?MRN: [id number] ?DOB: August 26, 1961 ? ? ?Cancelled Treatment:    Reason Eval/Treat Not Completed: Other (comment). COnsult received and chart reviewed. Pt is POD 0 for TKR. Pt still without movement/sensation of surgical leg. Trace movement in L LE, full sensation. Not safe to perform mobility this date. ENcouraged to dangle at bedside with RN staff this evening. Will formally evaluate next date.  ? ? ?Reagen Goates ?02/06/2022, 3:41 PM ?Greggory Stallion, PT, DPT, GCS ?[phone] ? ?

## 2022-02-06 NOTE — Progress Notes (Signed)
The urine drug screen was ordered by the anesthesia service.  Patient required a preoperative urine drug screen due to his history of drug and alcohol abuse. ?

## 2022-02-06 NOTE — Op Note (Signed)
DATE OF SURGERY:  02/06/2022 ?TIME: 12:47 PM ? ?PATIENT NAME:  [name] [name]   ?AGE: 61 y.o.  ? ? ?PRE-OPERATIVE DIAGNOSIS:  Right Knee Osteoarthritis ? ?POST-OPERATIVE DIAGNOSIS:  Same ? ?PROCEDURE:  Procedure(s): ?RIGHT TOTAL KNEE ARTHROPLASTY ? ?SURGEON:  Thornton Park, MD  ? ?ASSISTANT:  Danae Orleans, PA ? ?OPERATIVE IMPLANTS: Depuy PFC Sigma, Posterior Stabilized Femural component size 4, Tibia size rotating platform component size 4, Patella polyethylene 3-peg oval button size 38, with a 10 mm polyethylene insert. ? ?EBL:  50 ml ? ?TOURNIQUET TIME:  117 minutes ? ?PREOPERATIVE INDICATIONS: ? ?[name] [name] is an 61 y.o. male who has a diagnosis of  Right Knee Osteoarthritis and elected for a right total knee arthroplasty after failing nonoperative treatment.  Their knee pain significantly impacts their activity of daily living.  Radiographs have demonstrated tricompartmental osteoarthritis joint space narrowing, osteophytes, and subchondral sclerosis. ? ?The risks, benefits, and alternatives were discussed at length including but not limited to the risks of infection, bleeding, nerve or blood vessel injury, knee stiffness, fracture, dislocation, loosening or failure of the hardware and the need for further surgery. Medical risks include but not limited to DVT and pulmonary embolism, myocardial infarction, stroke, pneumonia, respiratory failure and death. I discussed these risks with the patient in my office prior to the date of surgery. They understood these risks and were willing to proceed. ? ?OPERATIVE FINDINGS AND UNIQUE ASPECTS OF THE CASE: Advanced tricompartmental osteoarthritis with large osteophytes ? ?OPERATIVE DESCRIPTION: ? ?The patient was brought to the operative room and placed in a supine position after undergoing placement of a spinal anesthetic.  A Foley catheter was placed.  IV antibiotics were given. Patient received Kefzol 2 g and clindamycin 600 mg IV.  Patient also received a dose of  tranexamic acid prior to the tourniquet being inflated.  The lower extremity was prepped and draped in the usual sterile fashion.  A time out was performed to verify the patient's name, date of birth, medical record number, correct site of surgery and correct procedure to be performed. The timeout was also used to confirm the patient received antibiotics and that appropriate instruments, implants and radiographs studies were available in the room.  The leg was elevated and exsanguinated with an Esmarch and the tourniquet was inflated to 275 mmHg for 117 minutes.. ? ?A midline incision was made over the right knee. Full-thickness skin flaps were developed. A medial parapatellar arthrotomy was then made and the patella everted and the knee was brought into 90? of flexion. Hoffa's fat pad along with the cruciate ligaments and medial and lateral menisci were resected.  ? ?The distal femoral intramedullary canal was opened with a drill and the intramedullary distal femoral cutting jig was inserted into the femoral canal pinned into position. It was set at 5 degrees resecting 10 mm off the distal femur.  Care was taken to protect the collateral ligaments during distal femoral resection.  The distal femoral resection was performed with an oscillating saw. The femoral cutting guide was then removed. ? ?The extramedullary tibial cutting guide was then placed using the anterior tibial crest and second ray of the foot as a references.  The tibial cutting guide was adjusted to allow for appropriate posterior slope.  The tibial cutting block was pinned into position. The slotted stylus was used to measure the proximal tibial resection of 10 mm off the high lateral side.  The tibial Soter rod alignment guide was then used to confirm position of  the cutting block. A third cross pin through the tibial cutting block was then drilled into position to allow for rotational stability. Care was taken during the tibial resection to protect  the medial and collateral ligaments.  The resected tibial bone was removed along with the posterior horns of the menisci.  The PCL was sacrificed.  Extension gap was measured with a spacer block and alignment and extension was confirmed using a [place] alignment rod. ? ?The attention was then turned back to the femur. The posterior referencing distal femoral sizing guide was applied to the distal femur.  The femur was sized to be a size 4. Rotation of the referencing guide was checked with the epicondylar axis and [place] line. Then the 4-in-1 cutting jig was then applied to the distal femur. A stylus was used to confirm that the anterior femur would not be notched.   Then the anterior, posterior and chamfer femoral cuts were then made with an oscillating saw.  The flexion gap was then measured with a flexion spacer block and [place] alignment rod and was found to be symmetric with the extension gap and perpendicular to mechanical axis of the tibia. ? ?The distal femoral preparation was completed by performing the posterior stabilized box cut using the cutting block. The entry site for the intramedullary femoral guide was filled with autologous bone graft from bone previously resected earlier in the case. ? ?The proximal tibia plateau was then sized with trial trays. The best coverage was achieved with a size 4. This tibial tray was then pinned into position. The proximal tibia was then prepared with the reamer and keel punch.  After tibial preparation was completed, all trial components were inserted with polyethylene trials.  The knee was found to have excellent balance and full motion with a size 10 mm tibial polyethylene insert..   ? ?The attention was then turned to preparation of the patella. The thickness of the patella was measured with a caliper, the diameter measured with the patella templates.  The patella resection was then made with an oscillating saw using the patella cutting guide.  The final patellar  diameter was 38 mm.  3 peg holes for the patella component were then drilled. The trial patella was then placed. Knee was taken through a full range of motion and deemed to be stable with the trial components. All trial components were then removed. The knee capsule was then injected with Exparel.  The knee joint capsule was injected with a mixture of quarter percent Marcaine, Toradol and morphine to assist with postoperative pain relief.  The joint was copiously irrigated with pulse lavage. ? ?The final total knee arthroplasty components were then cemented into place with a 10 mm trial polyethylene insert and all excess methylmethacrylate was removed.  The joint was again copiously irrigated. After the cement had hardened the knee was again taken through a full range of motion. It was felt to be most stable with the 10 mm tibial polyethylene insert.  The tourniquet was then let down and all bleeding vessels cauterized.  The actual tibial polyethylene insert was then placed. The knee was taken through a range of motion and the patella tracked well and the knee was again irrigated copiously.   ? ?The medial arthrotomy was closed with #1 Ethibond. The subcutaneous tissue closed with 0 and 2-0 vicryl, and skin approximated with staples.  A dry sterile and compressive dressing was applied.  A Polar Care was applied to the operative knee along  with a knee immobilizer. ? ?The patient was awakened and brought to the PACU in stable and satisfactory condition.  All sharp, lap and instrument counts were correct at the conclusion the case. I spoke with the patient's family in the postop consultation room to let them know the case had been performed without complication and the patient was stable in recovery room.  ?  ?

## 2022-02-06 NOTE — Anesthesia Preprocedure Evaluation (Addendum)
Anesthesia Evaluation  ?Patient identified by MRN, date of birth, ID band ?Patient awake ? ? ? ?Reviewed: ?Allergy & Precautions, NPO status , Patient's Chart, lab work & pertinent test results ? ?History of Anesthesia Complications ?Negative for: history of anesthetic complications ? ?Airway ?Mallampati: II ? ?TM Distance: >3 FB ?Neck ROM: Full ? ? ? Dental ? ?(+) Poor Dentition, Missing ?  ?Pulmonary ?neg sleep apnea, neg COPD, Current SmokerPatient did not abstain from smoking.,  ?  ?Pulmonary exam normal ?breath sounds clear to auscultation ? ? ? ? ? ? Cardiovascular ?Exercise Tolerance: Good ?METShypertension, Pt. on medications ?(-) CAD and (-) Past MI (-) dysrhythmias  ?Rhythm:Regular Rate:Normal ?- Systolic murmurs ?TTE 2019: ?- Left ventricle: The cavity size was mildly dilated. Wall  ???thickness was normal. Systolic function was normal. The estimated  ???ejection fraction was in the range of 50% to 55%.  ?- Mitral valve: There was mild regurgitation.  ?  ?Neuro/Psych ?PSYCHIATRIC DISORDERS Depression Bipolar Disorder Schizophrenia Residual left fingertip numbness ?CVA, Residual Symptoms   ? GI/Hepatic ?neg GERD  ,(+)  ?  ? substance abuse ? alcohol use and marijuana use, Hx cocaine use, sober for 2 years per patient. ?  ?Endo/Other  ?neg diabetes ? Renal/GU ?negative Renal ROS  ? ?  ?Musculoskeletal ? ? Abdominal ?  ?Peds ? Hematology ?  ?Anesthesia Other Findings ?Past Medical History: ?No date: Accidental drug overdose ?No date: Arthritis of knee ?No date: Bipolar 1 disorder (HCC) ?No date: 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HCC) ?07/02/2021: History of 2019 novel coronavirus disease (COVID-19) ?No date: Homicidal ideation ?No date: Hypertension ?No date: Malingering ?No date: MDD (major depressive disorder) ?No date: Polysubstance abuse (HCC) ?    Comment:  a.) THC, cocaine, heroin, ETOH, tobacco. b.) "died" for  ?             3 minutes following  use of cocaine that was laced with  ?             fentanyl ?No date: Schizoaffective disorder (HCC) ?No date: Schizophrenia [facility]) ?No date: Severe alcohol use disorder (HCC) ?No date: Stroke [facility]) ?    Comment:  2019 ?04/13/2015: Suicidal ideation ?    Comment:  a.) SI (+) plan to "jump in front of a train" ? Reproductive/Obstetrics ? ?  ? ? ? ? ? ? ? ? ? ? ? ? ? ?  ?  ? ? ? ? ? ? ? ?Anesthesia Physical ?Anesthesia Plan ? ?ASA: 3 ? ?Anesthesia Plan: Spinal  ? ?Post-op Pain Management: Ofirmev IV (intra-op)*  ? ?Induction: Intravenous ? ?PONV Risk Score and Plan: 0 and Ondansetron, Dexamethasone, Propofol infusion, TIVA and Midazolam ? ?Airway Management Planned: Natural Airway ? ?Additional Equipment: None ? ?Intra-op Plan:  ? ?Post-operative Plan:  ? ?Informed Consent: I have reviewed the patients History and Physical, chart, labs and discussed the procedure including the risks, benefits and alternatives for the proposed anesthesia with the patient or authorized representative who has indicated his/her understanding and acceptance.  ? ? ? ? ? ?Plan Discussed with: CRNA and Surgeon ? ?Anesthesia Plan Comments: (Discussed R/B/A of neuraxial anesthesia technique with patient: ?- rare risks of spinal/epidural hematoma, nerve damage, infection ?- Risk of PDPH ?- Risk of nausea and vomiting ?- Risk of conversion to general anesthesia and its associated risks, including sore throat, damage to lips/eyes/teeth/oropharynx, and rare risks such as cardiac and respiratory events. ?- Risk of allergic reactions ? ?Patient counseled on benefits of smoking cessation, and  increased perioperative risks associated with continued smoking. ? ?Discussed the role of CRNA in patient's perioperative care. ? ?Patient voiced understanding.)  ? ? ? ? ? ? ?Anesthesia Quick Evaluation ? ?

## 2022-02-06 NOTE — Anesthesia Procedure Notes (Signed)
Spinal ? ?Patient location during procedure: OR ?Start time: 02/06/2022 9:06 AM ?End time: 02/06/2022 9:11 AM ?Reason for block: surgical anesthesia ?Staffing ?Resident/CRNA: Loletha Grayer, CRNA ?Preanesthetic Checklist ?Completed: patient identified, IV checked, site marked, risks and benefits discussed, surgical consent, monitors and equipment checked, pre-op evaluation and timeout performed ?Spinal Block ?Patient position: sitting ?Prep: Betadine ?Patient monitoring: heart rate, continuous pulse ox, blood pressure and cardiac monitor ?Approach: midline ?Location: L4-5 ?Injection technique: single-shot ?Needle ?Needle type: Introducer and Pencan  ?Needle gauge: 24 G ?Needle length: 10 cm ?Assessment ?Events: CSF return ?Additional Notes ?Negative paresthesia. Negative blood return. Positive free-flowing CSF. Expiration date of kit checked and confirmed. Patient tolerated procedure well, without complications. ? ? ? ? ? ?

## 2022-02-06 NOTE — Transfer of Care (Signed)
Immediate Anesthesia Transfer of Care Note ? ?Patient: [name] [name] ? ?Procedure(s) Performed: TOTAL KNEE ARTHROPLASTY (Right: Knee) ? ?Patient Location: PACU ? ?Anesthesia Type:Spinal ? ?Level of Consciousness: awake and patient cooperative ? ?Airway & Oxygen Therapy: Patient Spontanous Breathing and Patient connected to face mask oxygen ? ?Post-op Assessment: Report given to RN and Post -op Vital signs reviewed and stable ? ?Post vital signs: Reviewed and stable ? ?Last Vitals:  ?Vitals Value Taken Time  ?BP 120/81 02/06/22 1230  ?Temp    ?Pulse 80 02/06/22 1233  ?Resp 18 02/06/22 1233  ?SpO2 100 % 02/06/22 1233  ?Vitals shown include unvalidated device data. ? ?Last Pain:  ?Vitals:  ? 02/06/22 1225  ?TempSrc:   ?PainSc: Asleep  ?   ? ?  ? ?Complications: No notable events documented. ?

## 2022-02-07 ENCOUNTER — Encounter: Payer: Self-pay | Admitting: Orthopedic Surgery

## 2022-02-07 DIAGNOSIS — M1711 Unilateral primary osteoarthritis, right knee: Secondary | ICD-10-CM | POA: Diagnosis not present

## 2022-02-07 LAB — CBC
HCT: 37.6 % — ABNORMAL LOW (ref 39.0–52.0)
Hemoglobin: 11.7 g/dL — ABNORMAL LOW (ref 13.0–17.0)
MCH: 27 pg (ref 26.0–34.0)
MCHC: 31.1 g/dL (ref 30.0–36.0)
MCV: 86.6 fL (ref 80.0–100.0)
Platelets: 248 10*3/uL (ref 150–400)
RBC: 4.34 MIL/uL (ref 4.22–5.81)
RDW: 14.1 % (ref 11.5–15.5)
WBC: 12.5 10*3/uL — ABNORMAL HIGH (ref 4.0–10.5)
nRBC: 0 % (ref 0.0–0.2)

## 2022-02-07 LAB — BASIC METABOLIC PANEL
Anion gap: 6 (ref 5–15)
BUN: 19 mg/dL (ref 6–20)
CO2: 25 mmol/L (ref 22–32)
Calcium: 8.6 mg/dL — ABNORMAL LOW (ref 8.9–10.3)
Chloride: 104 mmol/L (ref 98–111)
Creatinine, Ser: 1.05 mg/dL (ref 0.61–1.24)
GFR, Estimated: >60 mL/min (ref >60)
Glucose, Bld: 117 mg/dL — ABNORMAL HIGH (ref 70–99)
Potassium: 3.9 mmol/L (ref 3.5–5.1)
Sodium: 135 mmol/L (ref 135–145)

## 2022-02-07 NOTE — Evaluation (Signed)
Occupational Therapy Evaluation ?Patient Details ?Name: [name] [name] ?MRN: [id number] ?DOB: [birth year] ?Today's Date: 02/07/2022 ? ? ?History of Present Illness 61 yo M s/p R TKR. PMH significant for bipolar disorder, depression, schizophrenia, polysubstance use, accidental drug overdose, HTN, and stroke (2019)  ? ?Clinical Impression ?  ?Pt seen for OT evaluation this date, POD#1 from above surgery. Upon arrival to room, pt awake and seated upright in recliner. Pt c/o dizziness (BP 119/80), reporting 8/10 pain in knee, and requesting pain medicine; RN informed and provided pain medicine during session. Pt reports that prior to surgery, pt was independent in all ADLs, living alone in a handicap-accessible apartment. Pt is eager to return to PLOF with less pain and improved safety and independence. Pt currently presents with increased pain, decreased AROM of R knee, and decreased balance. Due to these functional impairments, pt requires MIN GUARD for seated LB dressing while in seated position, MIN GUARD for sit>stand transfers, and SUPERVISION for bed mobility. Pt instructed in polar care mgt, falls prevention strategies, home/routines modifications, DME/AE for LB bathing and dressing tasks, and compression stocking mgt; pt verbalized understanding. Pt would benefit from skilled OT services including additional instruction in techniques with or without assistive devices for dressing and bathing skills to support recall and carryover prior to discharge and ultimately to maximize safety, independence, and minimize falls risk and caregiver burden. Upon discharge, recommend HHOT.  ? ?Recommendations for follow up therapy are one component of a multi-disciplinary discharge planning process, led by the attending physician.  Recommendations may be updated based on patient status, additional functional criteria and insurance authorization.  ? ?Follow Up Recommendations ? Home health OT  ?  ?Assistance Recommended at Discharge  Intermittent Supervision/Assistance  ?Patient can return home with the following A little help with walking and/or transfers;A little help with bathing/dressing/bathroom ? ?  ?Functional Status Assessment ? Patient has had a recent decline in their functional status and demonstrates the ability to make significant improvements in function in a reasonable and predictable amount of time.  ?Equipment Recommendations ? Tub/shower bench  ?  ?   ?Precautions / Restrictions Precautions ?Precautions: Fall ?Restrictions ?Weight Bearing Restrictions: No  ? ?  ? ?Mobility Bed Mobility ?Overal bed mobility: Needs Assistance ?Bed Mobility: Sit to Supine ?  ?  ?  ?Sit to supine: Supervision, HOB elevated ?  ?General bed mobility comments: Requires increased time/effort ?  ? ?Transfers ?Overall transfer level: Needs assistance ?Equipment used: Rolling walker (2 wheels) ?Transfers: Sit to/from Stand ?Sit to Stand: Min guard ?  ?  ?  ?  ?  ?General transfer comment: Demonstrates safe UE placement with RW use ?  ? ?  ?Balance Overall balance assessment: Needs assistance ?Sitting-balance support: No upper extremity supported, Feet supported ?Sitting balance-Leahy Scale: Good ?Sitting balance - Comments: Good sitting balance reaching outside BOS to don/doff socks ?  ?Standing balance support: Bilateral upper extremity supported, During functional activity ?Standing balance-Leahy Scale: Fair ?Standing balance comment: Requires MIN GUARD for stand pivot transfers ?  ?  ?  ?  ?  ?  ?  ?  ?  ?  ?  ?   ? ?ADL either performed or assessed with clinical judgement  ? ?ADL Overall ADL's : Needs assistance/impaired ?  ?  ?  ?  ?  ?  ?  ?  ?  ?  ?Lower Body Dressing: Min guard;Sitting/lateral leans ?Lower Body Dressing Details (indicate cue type and reason): to don/doff socks ?  ?  ?  ?  ?  ?  ?  ?   ? ? ? ?  Vision Baseline Vision/History: 1 Wears glasses ?Ability to See in Adequate Light: 0 Adequate ?Patient Visual Report: No change from  baseline ?   ?   ?   ?   ? ?Pertinent Vitals/Pain Pain Assessment ?Pain Assessment: 0-10 ?Pain Score: 8  ?Pain Location: R knee ?Pain Descriptors / Indicators: Aching, Sharp, Shooting ?Pain Intervention(s): Limited activity within patient's tolerance, Monitored during session, Repositioned, RN gave pain meds during session  ? ? ? ?   ?Extremity/Trunk Assessment Upper Extremity Assessment ?Upper Extremity Assessment: Overall WFL for tasks assessed ?  ?Lower Extremity Assessment ?Lower Extremity Assessment: RLE deficits/detail ?RLE Deficits / Details: s/p TKR ?RLE: Unable to fully assess due to pain ?  ?  ?  ?Communication Communication ?Communication: No difficulties ?  ?Cognition Arousal/Alertness: Awake/alert ?Behavior During Therapy: Anxious ?Overall Cognitive Status: Within Functional Limits for tasks assessed ?  ?  ?  ?  ?  ?  ?  ?  ?  ?  ?  ?  ?  ?  ?  ?  ?General Comments: Pt A&Ox4. Agreeable throughout but anxious and requiring cues to maintain attention ?  ?  ?General Comments  Pt c/o of dizziness upon arrival. BP 119/80. RN informed ? ?  ?   ?   ? ? ?Home Living Family/patient expects to be discharged to:: Private residence (pt reports he lives in a fixed income apartment that is fitted for people with disabilities) ?Living Arrangements: Alone ?  ?Type of Home: Apartment ?  ?  ?  ?Home Layout: One level ?  ?  ?Bathroom Shower/Tub: Tub/shower unit ?  ?Bathroom Toilet: Handicapped height ?  ?  ?Home Equipment: Grab bars - tub/shower;Grab bars - toilet ?  ?  ?  ? ?  ?Prior Functioning/Environment Prior Level of Function : Independent/Modified Independent ?  ?  ?  ?  ?  ?  ?  ?ADLs Comments: Pt reports he is independent with ADLs at baseline ?  ? ?  ?  ?OT Problem List: Decreased strength;Decreased range of motion;Decreased activity tolerance;Impaired balance (sitting and/or standing);Pain ?  ?   ?OT Treatment/Interventions: Self-care/ADL training;Therapeutic exercise;Energy conservation;DME and/or AE  instruction;Therapeutic activities;Patient/family education;Balance training  ?  ?OT Goals(Current goals can be found in the care plan section) Acute Rehab OT Goals ?Patient Stated Goal: to regain independence ?OT Goal Formulation: With patient ?Time For Goal Achievement: 02/21/22 ?Potential to Achieve Goals: Good ?ADL Goals ?Pt Will Perform Grooming: with modified independence;standing ?Pt Will Perform Lower Body Dressing: with modified independence;sit to/from stand ?Pt Will Transfer to Toilet: with modified independence;ambulating;regular height toilet;grab bars  ?OT Frequency: Min 2X/week ?  ? ?   ?AM-PAC OT "6 Clicks" Daily Activity     ?Outcome Measure Help from another person eating meals?: None ?Help from another person taking care of personal grooming?: None ?Help from another person toileting, which includes using toliet, bedpan, or urinal?: A Little ?Help from another person bathing (including washing, rinsing, drying)?: A Lot ?Help from another person to put on and taking off regular upper body clothing?: None ?Help from another person to put on and taking off regular lower body clothing?: A Little ?6 Click Score: 20 ?  ?End of Session Equipment Utilized During Treatment: Rolling walker (2 wheels) ?Nurse Communication: Mobility status;Patient requests pain meds ? ?Activity Tolerance: Patient tolerated treatment well ?Patient left: in bed;with call bell/phone within reach;with bed alarm set;with nursing/sitter in room ? ?OT Visit Diagnosis: Unsteadiness on feet (R26.81);Pain ?Pain - Right/Left: Right ?Pain -  part of body: Knee  ?              ?Time: 1001-1025 ?OT Time Calculation (min): 24 min ?Charges:  OT General Charges ?$OT Visit: 1 Visit ?OT Evaluation ?$OT Eval Moderate Complexity: 1 Mod ?OT Treatments ?$Self Care/Home Management : 8-22 mins ? ?Matthew Folks, OTR/L ?[facility]COM [phone] ? ?

## 2022-02-07 NOTE — Plan of Care (Signed)
?  Problem: Education: ?Goal: Knowledge of the prescribed therapeutic regimen will improve ?Outcome: Progressing ?  ?Problem: Activity: ?Goal: Ability to avoid complications of mobility impairment will improve ?Outcome: Progressing ?Goal: Range of joint motion will improve ?Outcome: Progressing ?  ?Problem: Pain Management: ?Goal: Pain level will decrease with appropriate interventions ?Outcome: Progressing ?  ?Problem: Skin Integrity: ?Goal: Will show signs of wound healing ?Outcome: Progressing ?  ?Problem: Education: ?Goal: Knowledge of General Education information will improve ?Description: Including pain rating scale, medication(s)/side effects and non-pharmacologic comfort measures ?Outcome: Progressing ?  ?Problem: Health Behavior/Discharge Planning: ?Goal: Ability to manage health-related needs will improve ?Outcome: Progressing ?  ?Problem: Clinical Measurements: ?Goal: Will remain free from infection ?Outcome: Progressing ?  ?Problem: Activity: ?Goal: Risk for activity intolerance will decrease ?Outcome: Progressing ?  ?Problem: Pain Managment: ?Goal: General experience of comfort will improve ?Outcome: Progressing ?  ?

## 2022-02-07 NOTE — Progress Notes (Signed)
Physical Therapy Treatment ?Patient Details ?Name: [name] [name] ?MRN: [id number] ?DOB: [birth year] ?Today's Date: 02/07/2022 ? ? ?History of Present Illness 61 yo M s/p R TKR. PMH significant for bipolar disorder, depression, schizophrenia, polysubstance use, accidental drug overdose, HTN, and stroke (2019) ? ?  ?PT Comments  ? ? Pt is making good progress towards goals with ability to ambulate in hallway this session. Still complains of significant pain, RN notified and plans to bring pain meds when they are due. Pt walks with antalgic gait pattern and needs cues for gait symmetry. Good endurance with HEP and pt reports he has been working on the exercises since this morning. Reports less pain post ambulation. Will continue to progress as able. Encouraged to elevate leg and maintain extension.   ?Recommendations for follow up therapy are one component of a multi-disciplinary discharge planning process, led by the attending physician.  Recommendations may be updated based on patient status, additional functional criteria and insurance authorization. ? ?Follow Up Recommendations ? Home health PT ?  ?  ?Assistance Recommended at Discharge Intermittent Supervision/Assistance  ?Patient can return home with the following A little help with walking and/or transfers;Assistance with cooking/housework;Assist for transportation;Help with stairs or ramp for entrance ?  ?Equipment Recommendations ? Rolling walker (2 wheels)  ?  ?Recommendations for Other Services   ? ? ?  ?Precautions / Restrictions Precautions ?Precautions: Fall ?Restrictions ?Weight Bearing Restrictions: Yes ?RLE Weight Bearing: Weight bearing as tolerated  ?  ? ?Mobility ? Bed Mobility ?Overal bed mobility: Needs Assistance ?Bed Mobility: Supine to Sit ?  ?  ?Supine to sit: Min guard ?  ?  ?General bed mobility comments: not performed as received seated at EOB ?  ? ?Transfers ?Overall transfer level: Needs assistance ?Equipment used: Rolling walker (2  wheels) ?Transfers: Sit to/from Stand ?Sit to Stand: Min guard ?  ?  ?  ?  ?  ?General transfer comment: safe technique with cues for hand placement ?  ? ?Ambulation/Gait ?Ambulation/Gait assistance: Min guard ?Gait Distance (Feet): 150 Feet ?Assistive device: Rolling walker (2 wheels) ?Gait Pattern/deviations: Step-through pattern ?  ?  ?  ?General Gait Details: ambulated in hallway with cues for foot flat sequencing and still demonstrates increased knee flexion in stance phase. RW used with safe technique ? ? ?Stairs ?  ?  ?  ?  ?  ? ? ?Wheelchair Mobility ?  ? ?Modified Rankin (Stroke Patients Only) ?  ? ? ?  ?Balance Overall balance assessment: Needs assistance ?Sitting-balance support: No upper extremity supported, Feet supported ?Sitting balance-Leahy Scale: Good ?  ?  ?Standing balance support: Bilateral upper extremity supported, During functional activity ?Standing balance-Leahy Scale: Fair ?  ?  ?  ?  ?  ?  ?  ?  ?  ?  ?  ?  ?  ? ?  ?Cognition Arousal/Alertness: Awake/alert ?Behavior During Therapy: [facility] for tasks assessed/performed ?Overall Cognitive Status: Within Functional Limits for tasks assessed ?  ?  ?  ?  ?  ?  ?  ?  ?  ?  ?  ?  ?  ?  ?  ?  ?General Comments: very pleasant throughout session ?  ?  ? ?  ?Exercises Total Joint Exercises ?Goniometric ROM: R knee AAROM: 5-80 degrees ?Other Exercises ?Other Exercises: seated ther-ex performed on R LE including LAQ, AP, and SLRs. 10 reps and HEP reviewed ?Other Exercises: ambulated to bathroom to void, supervision given. Pt able to pick up trash from floor and  wipe seated off with supervision ? ?  ?General Comments General comments (skin integrity, edema, etc.): Pt c/o of dizziness upon arrival. BP 119/80. RN informed ?  ?  ? ?Pertinent Vitals/Pain Pain Assessment ?Pain Assessment: 0-10 ?Pain Score: 8  ?Pain Location: R knee ?Pain Descriptors / Indicators: Aching, Sharp, Shooting ?Pain Intervention(s): Limited activity within patient's tolerance, Patient  requesting pain meds-RN notified  ? ? ?Home Living Family/patient expects to be discharged to:: Private residence (pt reports he lives in fixed income apartment that is fitted for people with disabilities) ?Living Arrangements: Alone ?Available Help at Discharge: Friend(s);Available PRN/intermittently ?Type of Home: Apartment ?Home Access: Level entry ?  ?  ?  ?Home Layout: One level ?Home Equipment: Grab bars - tub/shower;Grab bars - toilet ?   ?  ?Prior Function    ?  ?  ?   ? ?PT Goals (current goals can now be found in the care plan section) Acute Rehab PT Goals ?Patient Stated Goal: to go home ?PT Goal Formulation: With patient ?Time For Goal Achievement: 02/21/22 ?Potential to Achieve Goals: Good ?Additional Goals ?Additional Goal #1: Pt will be able to perform bed mobility/transfers with mod I and safe technique in order to improve functional independence ?Progress towards PT goals: Progressing toward goals ? ?  ?Frequency ? ? ? BID ? ? ? ?  ?PT Plan Current plan remains appropriate  ? ? ?Co-evaluation   ?  ?  ?  ?  ? ?  ?AM-PAC PT "6 Clicks" Mobility   ?Outcome Measure ? Help needed turning from your back to your side while in a flat bed without using bedrails?: None ?Help needed moving from lying on your back to sitting on the side of a flat bed without using bedrails?: A Little ?Help needed moving to and from a bed to a chair (including a wheelchair)?: A Little ?Help needed standing up from a chair using your arms (e.g., wheelchair or bedside chair)?: A Little ?Help needed to walk in hospital room?: A Little ?Help needed climbing 3-5 steps with a railing? : A Lot ?6 Click Score: 18 ? ?  ?End of Session Equipment Utilized During Treatment: Gait belt ?Activity Tolerance: Patient tolerated treatment well ?Patient left: in bed (seated at EOB, currently refusing bed alarm, polar care, and KI) ?Nurse Communication: Mobility status ?PT Visit Diagnosis: Muscle weakness (generalized) (M62.81);Difficulty in walking,  not elsewhere classified (R26.2);Pain ?Pain - Right/Left: Right ?Pain - part of body: Knee ?  ? ? ?Time: [id number] ?PT Time Calculation (min) (ACUTE ONLY): 16 min ? ?Charges:  $Gait Training: 8-22 mins ?$Therapeutic Exercise: 8-22 mins          ?          ? ?Greggory Stallion, PT, DPT, GCS ?[phone] ? ? ? ?Miranda Frese ?02/07/2022, 3:04 PM ? ?

## 2022-02-07 NOTE — Evaluation (Signed)
Physical Therapy Evaluation ?Patient Details ?Name: [name] [name] ?MRN: [id number] ?DOB: Feb 02, 1961 ?Today's Date: 02/07/2022 ? ?History of Present Illness ? 61 yo M s/p R TKR. PMH significant for bipolar disorder, depression, schizophrenia, polysubstance use, accidental drug overdose, HTN, and stroke (2019)  ?Clinical Impression ? Pt is a pleasant 61 year old male who was admitted for R TKR. Pt performs bed mobility, transfers, and ambulation with cga and RW. Pt demonstrates deficits with strength/ROM/mobility. Pt is very motivated to perform PT. Removed KI for PT session, will plan to don once back in bed. Educated pt on communicating to family the need for increased assist at home. Still limited by pain with mobility. Would benefit from skilled PT to address above deficits and promote optimal return to PLOF. Recommend transition to HHPT upon discharge from acute hospitalization. ? ?   ? ?Recommendations for follow up therapy are one component of a multi-disciplinary discharge planning process, led by the attending physician.  Recommendations may be updated based on patient status, additional functional criteria and insurance authorization. ? ?Follow Up Recommendations Home health PT ? ?  ?Assistance Recommended at Discharge Intermittent Supervision/Assistance  ?Patient can return home with the following ? A little help with walking and/or transfers;Assistance with cooking/housework;Assist for transportation;Help with stairs or ramp for entrance ? ?  ?Equipment Recommendations Rolling walker (2 wheels)  ?Recommendations for Other Services ?    ?  ?Functional Status Assessment Patient has had a recent decline in their functional status and demonstrates the ability to make significant improvements in function in a reasonable and predictable amount of time.  ? ?  ?Precautions / Restrictions Precautions ?Precautions: Fall ?Restrictions ?Weight Bearing Restrictions: Yes ?RLE Weight Bearing: Weight bearing as tolerated  ? ?   ? ?Mobility ? Bed Mobility ?Overal bed mobility: Needs Assistance ?Bed Mobility: Supine to Sit ?  ?  ?Supine to sit: Min guard ?  ?  ?General bed mobility comments: safe technique with guidance for R LE. Once seated, upright posture noted ?  ? ?Transfers ?Overall transfer level: Needs assistance ?Equipment used: Rolling walker (2 wheels) ?Transfers: Sit to/from Stand ?Sit to Stand: Min guard ?  ?  ?  ?  ?  ?General transfer comment: safe technique with cues for hand placement ?  ? ?Ambulation/Gait ?Ambulation/Gait assistance: Min guard ?Gait Distance (Feet): 50 Feet ?Assistive device: Rolling walker (2 wheels) ?Gait Pattern/deviations: Step-to pattern ?  ?  ?  ?General Gait Details: ambulated around room, in/out of bathroom. Used RW with limited self selected WBing on surgical leg. Cues for foot flat and reciprocal gait pattern. LImited further distance due to pain ? ?Stairs ?  ?  ?  ?  ?  ? ?Wheelchair Mobility ?  ? ?Modified Rankin (Stroke Patients Only) ?  ? ?  ? ?Balance Overall balance assessment: Needs assistance (Simultaneous filing. User may not have seen previous data.) ?  ?  ?  ?  ?  ?  ?  ?  ?  ?  ?  ?  ?  ?  ?  ?  ?  ?  ?   ? ? ? ?Pertinent Vitals/Pain Pain Assessment ?Pain Assessment: 0-10 ?Pain Score: 5  ?Pain Location: R knee ?Pain Descriptors / Indicators: Aching, Sharp, Shooting ?Pain Intervention(s): Limited activity within patient's tolerance, Repositioned, Ice applied, Premedicated before session  ? ? ?Home Living Family/patient expects to be discharged to:: Private residence (pt reports he lives in fixed income apartment that is fitted for people with disabilities) ?Living Arrangements:  Alone ?Available Help at Discharge: Friend(s);Available PRN/intermittently ?Type of Home: Apartment ?Home Access: Level entry ?  ?  ?  ?Home Layout: One level ?Home Equipment: Grab bars - tub/shower;Grab bars - toilet ?   ?  ?Prior Function Prior Level of Function : Independent/Modified Independent ?  ?  ?  ?  ?   ?  ?Mobility Comments: reports he was indep and active prior ?ADLs Comments: Pt reports he is independent with ADLs at baseline ?  ? ? ?Hand Dominance  ?   ? ?  ?Extremity/Trunk Assessment  ? Upper Extremity Assessment ?Upper Extremity Assessment: Overall WFL for tasks assessed ?  ? ?Lower Extremity Assessment ?Lower Extremity Assessment: Generalized weakness (R LE grossly 3/5) ?RLE Deficits / Details: s/p TKR ?RLE: Unable to fully assess due to pain ?  ? ?   ?Communication  ? Communication: No difficulties  ?Cognition Arousal/Alertness: Awake/alert ?Behavior During Therapy: [facility] for tasks assessed/performed ?Overall Cognitive Status: Within Functional Limits for tasks assessed ?  ?  ?  ?  ?  ?  ?  ?  ?  ?  ?  ?  ?  ?  ?  ?  ?General Comments: very pleasant throughout session ?  ?  ? ?  ?General Comments General comments (skin integrity, edema, etc.): Pt c/o of dizziness upon arrival. BP 119/80. RN informed ? ?  ?Exercises Total Joint Exercises ?Goniometric ROM: R knee AAROM: 5-80 degrees ?Other Exercises ?Other Exercises: supine ther-ex performed on R LE including SLRs, hip abd/add, and seated knee flexion stretches. ALl ther-ex performed x 10 reps. Written HEP given and briefly reviewed. ?Other Exercises: ambulated to bathroom to void, supervision given. Pt able to pick up trash from floor and wipe seated off with supervision  ? ?Assessment/Plan  ?  ?PT Assessment Patient needs continued PT services  ?PT Problem List Decreased strength;Decreased range of motion;Decreased balance;Decreased mobility;Pain ? ?   ?  ?PT Treatment Interventions DME instruction;Gait training;Stair training;Therapeutic exercise   ? ?PT Goals (Current goals can be found in the Care Plan section)  ?Acute Rehab PT Goals ?Patient Stated Goal: to go home ?PT Goal Formulation: With patient ?Time For Goal Achievement: 02/21/22 ?Potential to Achieve Goals: Good ? ?  ?Frequency BID ?  ? ? ?Co-evaluation   ?  ?  ?  ?  ? ? ?  ?AM-PAC PT "6 Clicks"  Mobility  ?Outcome Measure Help needed turning from your back to your side while in a flat bed without using bedrails?: None ?Help needed moving from lying on your back to sitting on the side of a flat bed without using bedrails?: A Little ?Help needed moving to and from a bed to a chair (including a wheelchair)?: A Little ?Help needed standing up from a chair using your arms (e.g., wheelchair or bedside chair)?: A Little ?Help needed to walk in hospital room?: A Little ?Help needed climbing 3-5 steps with a railing? : A Lot ?6 Click Score: 18 ? ?  ?End of Session Equipment Utilized During Treatment: Gait belt ?Activity Tolerance: Patient tolerated treatment well ?Patient left: in chair;with chair alarm set ?Nurse Communication: Mobility status ?PT Visit Diagnosis: Muscle weakness (generalized) (M62.81);Difficulty in walking, not elsewhere classified (R26.2);Pain ?Pain - Right/Left: Right ?Pain - part of body: Knee ?  ? ?Time: 3785-8850 ?PT Time Calculation (min) (ACUTE ONLY): 27 min ? ? ?Charges:   PT Evaluation ?$PT Eval Low Complexity: 1 Low ?PT Treatments ?$Therapeutic Exercise: 8-22 mins ?  ?   ? ? ?  Elizabeth Palau, PT, DPT, GCS ?[phone] ? ? ?Janet Humphreys ?02/07/2022, 11:54 AM ? ?

## 2022-02-07 NOTE — Progress Notes (Signed)
Patient repositioned in bed. Pain medication given during this round. Patient ambulated with walker to bathroom and back to bed. Patient resting in bed now with all possessions in reach, call bell in reach, bed lowest position. Polar care in place. No needs at this time.  ?

## 2022-02-07 NOTE — Progress Notes (Signed)
?Subjective: ? ?POD #1 s/p right total knee arthroplasty.   Patient reports right knee pain is significant.  Patient states he was able to perform exercises with physical therapy today and get up and walk to the bathroom.  His Foley catheter has been discontinued. ? ?Objective:  ? ?VITALS:   ?Vitals:  ? 02/07/22 0014 02/07/22 0345 02/07/22 0758 02/07/22 1217  ?BP: (!) 153/103 (!) 160/89 (!) 132/99 140/85  ?Pulse: 85 81 71 72  ?Resp: 18 18 18 20   ?Temp: 97.6 ?F (36.4 ?C) 97.6 ?F (36.4 ?C) 97.8 ?F (36.6 ?C) 97.9 ?F (36.6 ?C)  ?TempSrc:      ?SpO2: 100% 100% 96% 100%  ?Weight:      ?Height:      ? ? ?PHYSICAL EXAM: ?Right lower extremity ?Neurovascular intact ?Sensation intact distally ?Intact pulses distally ?Dorsiflexion/Plantar flexion intact ?Incision: dressing C/D/I ?No cellulitis present ?Compartment soft ? ?LABS ? ?Results for orders placed or performed during the hospital encounter of 02/06/22 (from the past 24 hour(s))  ?CBC     Status: None  ? Collection Time: 02/06/22  5:07 PM  ?Result Value Ref Range  ? WBC 9.4 4.0 - 10.5 K/uL  ? RBC 4.88 4.22 - 5.81 MIL/uL  ? Hemoglobin 13.2 13.0 - 17.0 g/dL  ? HCT 41.6 39.0 - 52.0 %  ? MCV 85.2 80.0 - 100.0 fL  ? MCH 27.0 26.0 - 34.0 pg  ? MCHC 31.7 30.0 - 36.0 g/dL  ? RDW 14.2 11.5 - 15.5 %  ? Platelets 274 150 - 400 K/uL  ? nRBC 0.0 0.0 - 0.2 %  ?Creatinine, serum     Status: None  ? Collection Time: 02/06/22  5:07 PM  ?Result Value Ref Range  ? Creatinine, Ser 1.12 0.61 - 1.24 mg/dL  ? GFR, Estimated >60 >60 mL/min  ?CBC     Status: Abnormal  ? Collection Time: 02/07/22  4:25 AM  ?Result Value Ref Range  ? WBC 12.5 (H) 4.0 - 10.5 K/uL  ? RBC 4.34 4.22 - 5.81 MIL/uL  ? Hemoglobin 11.7 (L) 13.0 - 17.0 g/dL  ? HCT 37.6 (L) 39.0 - 52.0 %  ? MCV 86.6 80.0 - 100.0 fL  ? MCH 27.0 26.0 - 34.0 pg  ? MCHC 31.1 30.0 - 36.0 g/dL  ? RDW 14.1 11.5 - 15.5 %  ? Platelets 248 150 - 400 K/uL  ? nRBC 0.0 0.0 - 0.2 %  ?Basic metabolic panel     Status: Abnormal  ? Collection Time:  02/07/22  4:25 AM  ?Result Value Ref Range  ? Sodium 135 135 - 145 mmol/L  ? Potassium 3.9 3.5 - 5.1 mmol/L  ? Chloride 104 98 - 111 mmol/L  ? CO2 25 22 - 32 mmol/L  ? Glucose, Bld 117 (H) 70 - 99 mg/dL  ? BUN 19 6 - 20 mg/dL  ? Creatinine, Ser 1.05 0.61 - 1.24 mg/dL  ? Calcium 8.6 (L) 8.9 - 10.3 mg/dL  ? GFR, Estimated >60 >60 mL/min  ? Anion gap 6 5 - 15  ? ? ?DG Knee Right Port ? ?Result Date: 02/06/2022 ?CLINICAL DATA:  Right knee replacement EXAM: PORTABLE RIGHT KNEE - 1-2 VIEW COMPARISON:  None. FINDINGS: Right knee demonstrates a total knee arthroplasty without evidence of hardware failure complication. No significant joint effusion. No fracture or dislocation. Alignment is anatomic. Surgical drains are present. Post-surgical changes noted in the surrounding soft tissues. Peripheral vascular atherosclerotic disease. IMPRESSION: 1. Interval right total knee arthroplasty. Electronically  Signed   By: Elige Ko M.D.   On: 02/06/2022 13:06   ? ?Assessment/Plan: ?1 Day Post-Op  ? ?Principal Problem: ?  S/P TKR (total knee replacement) using cement, right ? ?Patient continues to have postoperative pain in the right knee.  He would benefit from another day in the hospital to get his pain better controlled and continue with his physical therapy.  Patient will require home health PT upon discharge.  Continue patient's current pain management.  Patient's postoperative labs are within normal limits. ? ? ? ?[name] [name] , MD ?02/07/2022, 1:18 PM ? ? ? ? ?  ?

## 2022-02-07 NOTE — TOC Progression Note (Signed)
Transition of Care (TOC) - Progression Note  ? ? ?Patient Details  ?Name: [name] [name] ?MRN: [id number] ?Date of Birth: 05/28/1961 ? ?Transition of Care (TOC) CM/SW Contact  ?Reily Treloar A Tedric Leeth, LCSW ?Phone Number: ?02/07/2022, 3:35 PM ? ?Clinical Narrative:    ?CSW spoke with pt and explained that CSW has been unsuccessful at finding a HH agnecy to service pt due to insurance. [facility], [facility], [facility], and [facility] have all declined pt. Pt is agreeable to outpatient therapy. MD is aware and will set up the outpatient for him at his clinic.  ? ? ? ? ? ?Expected Discharge Plan: Home/Self Care ?Barriers to Discharge: Continued Medical Work up ? ?Expected Discharge Plan and Services ?Expected Discharge Plan: Home/Self Care ?  ?  ?  ?  ?                ?  ?  ?  ?  ?  ?  ?  ?  ?  ?  ? ? ?Social Determinants of Health (SDOH) Interventions ?  ? ?Readmission Risk Interventions ?   ? View : No data to display.  ?  ?  ?  ? ? ?

## 2022-02-07 NOTE — Anesthesia Postprocedure Evaluation (Signed)
Anesthesia Post Note ? ?Patient: [name] [name] ? ?Procedure(s) Performed: TOTAL KNEE ARTHROPLASTY (Right: Knee) ? ?Patient location during evaluation: Nursing Unit ?Anesthesia Type: Spinal ?Level of consciousness: oriented and awake and alert ?Pain management: pain level controlled ?Vital Signs Assessment: post-procedure vital signs reviewed and stable ?Respiratory status: spontaneous breathing and respiratory function stable ?Cardiovascular status: blood pressure returned to baseline and stable ?Postop Assessment: no headache, no backache, no apparent nausea or vomiting and patient able to bend at knees ?Anesthetic complications: no ? ? ?No notable events documented. ? ? ?Last Vitals:  ?Vitals:  ? 02/07/22 0014 02/07/22 0345  ?BP: (!) 153/103 (!) 160/89  ?Pulse: 85 81  ?Resp: 18 18  ?Temp: 36.4 ?C 36.4 ?C  ?SpO2: 100% 100%  ?  ?Last Pain:  ?Vitals:  ? 02/07/22 0558  ?TempSrc:   ?PainSc: 10-Worst pain ever  ? ? ?  ?  ?  ?  ?  ?  ? ?Stormy Fabian A ? ? ? ? ?

## 2022-02-08 DIAGNOSIS — M1711 Unilateral primary osteoarthritis, right knee: Secondary | ICD-10-CM | POA: Diagnosis not present

## 2022-02-08 LAB — CBC
HCT: 33.5 % — ABNORMAL LOW (ref 39.0–52.0)
Hemoglobin: 10.7 g/dL — ABNORMAL LOW (ref 13.0–17.0)
MCH: 27.2 pg (ref 26.0–34.0)
MCHC: 31.9 g/dL (ref 30.0–36.0)
MCV: 85.2 fL (ref 80.0–100.0)
Platelets: 228 10*3/uL (ref 150–400)
RBC: 3.93 MIL/uL — ABNORMAL LOW (ref 4.22–5.81)
RDW: 14 % (ref 11.5–15.5)
WBC: 8.1 10*3/uL (ref 4.0–10.5)
nRBC: 0 % (ref 0.0–0.2)

## 2022-02-08 MED ORDER — ASPIRIN EC 325 MG PO TBEC: 325.0000 mg | DELAYED_RELEASE_TABLET | Freq: Two times a day (BID) | ORAL | 0 refills | Status: DC

## 2022-02-08 MED ORDER — NICOTINE 21 MG/24HR TD PT24: 21.0000 mg | MEDICATED_PATCH | Freq: Every day | TRANSDERMAL | 0 refills | Status: DC

## 2022-02-08 MED ORDER — ACETAMINOPHEN 325 MG PO TABS: 325.0000 mg | ORAL_TABLET | Freq: Four times a day (QID) | ORAL | 1 refills | Status: DC | PRN

## 2022-02-08 MED ORDER — OXYCODONE HCL 5 MG PO TABS: 5.0000 mg | ORAL_TABLET | ORAL | 0 refills | Status: DC | PRN

## 2022-02-08 NOTE — TOC Progression Note (Signed)
Transition of Care (TOC) - Progression Note  ? ? ?Patient Details  ?Name: [name] [name] ?MRN: [id number] ?Date of Birth: 25-May-1961 ? ?Transition of Care (TOC) CM/SW Contact  ?Temperence Zenor A Eloy Fehl, LCSW ?Phone Number: ?02/08/2022, 8:40 AM ? ?Clinical Narrative:   After a denial yesterday CSW got notification from [facility] with [facility] stating pt is open with [facility] and the will see pt following dc.  ? ? ? ?Expected Discharge Plan: Home/Self Care ?Barriers to Discharge: Continued Medical Work up ? ?Expected Discharge Plan and Services ?Expected Discharge Plan: Home/Self Care ?  ?  ?  ?  ?                ?  ?  ?  ?  ?  ?  ?  ?  ?  ?  ? ? ?Social Determinants of Health (SDOH) Interventions ?  ? ?Readmission Risk Interventions ?   ? View : No data to display.  ?  ?  ?  ? ? ?

## 2022-02-08 NOTE — Progress Notes (Signed)
Occupational Therapy Treatment ?Patient Details ?Name: [name] [name] ?MRN: [id number] ?DOB: 02/09/1961 ?Today's Date: 02/08/2022 ? ? ?History of present illness 61 yo M s/p R TKR. PMH significant for bipolar disorder, depression, schizophrenia, polysubstance use, accidental drug overdose, HTN, and stroke (2019) ?  ?OT comments ? Pt seen for brief OT tx this date. Further instruction provided in polar care mgt, positioning of R knee (replaced rolled towel under ankle to help with knee extension), AE for LB ADL tasks and options for how to obtain. Pt verbalized understanding and agreed to share the handouts with his family once he returns home to help them provide appropriate assist as needed. Pt continues to benefit frmo skilled OT services to maximize reutrn to PLOF.  ? ?Recommendations for follow up therapy are one component of a multi-disciplinary discharge planning process, led by the attending physician.  Recommendations may be updated based on patient status, additional functional criteria and insurance authorization. ?   ?Follow Up Recommendations ? Home health OT  ?  ?Assistance Recommended at Discharge Intermittent Supervision/Assistance  ?Patient can return home with the following ? A little help with walking and/or transfers;A little help with bathing/dressing/bathroom ?  ?Equipment Recommendations ? Tub/shower bench;Other (comment) (reacher, sock aide)  ?  ?Recommendations for Other Services   ? ?  ?Precautions / Restrictions Precautions ?Precautions: Fall ?Restrictions ?Weight Bearing Restrictions: Yes ?RLE Weight Bearing: Weight bearing as tolerated  ? ? ?  ? ?Mobility Bed Mobility ?  ?  ?  ?  ?  ?  ?  ?  ?  ? ?Transfers ?  ?  ?  ?  ?  ?  ?  ?  ?  ?  ?  ?  ?Balance   ?  ?  ?  ?  ?  ?  ?  ?  ?  ?  ?  ?  ?  ?  ?  ?  ?  ?  ?   ? ?ADL either performed or assessed with clinical judgement  ? ?ADL   ?  ?  ?  ?  ?  ?  ?  ?  ?  ?  ?  ?  ?  ?  ?  ?  ?  ?  ?  ?  ?  ? ?Extremity/Trunk Assessment   ?  ?  ?  ?  ?   ? ?Vision   ?  ?  ?Perception   ?  ?Praxis   ?  ? ?Cognition Arousal/Alertness: Awake/alert ?Behavior During Therapy: [facility] for tasks assessed/performed ?Overall Cognitive Status: Within Functional Limits for tasks assessed ?  ?  ?  ?  ?  ?  ?  ?  ?  ?  ?  ?  ?  ?  ?  ?  ?  ?  ?  ?   ?Exercises Other Exercises ?Other Exercises: Further instruction provided in polar care mgt, positioning of R knee (replaced rolled towel under ankle to help with knee extension), AE for LB ADL tasks and options for how to obtain. ? ?  ?Shoulder Instructions   ? ? ?  ?General Comments    ? ? ?Pertinent Vitals/ Pain       Pain Assessment ?Pain Assessment: 0-10 ?Pain Score: 7  ?Pain Location: R knee ?Pain Descriptors / Indicators: Aching, Sharp, Shooting ?Pain Intervention(s): Limited activity within patient's tolerance, RN gave pain meds during session, Monitored during session, Repositioned, Other (comment) (declined ice) ? ?Home Living   ?  ?  ?  ?  ?  ?  ?  ?  ?  ?  ?  ?  ?  ?  ?  ?  ?  ?  ? ?  ?  Prior Functioning/Environment    ?  ?  ?  ?   ? ?Frequency ? Min 2X/week  ? ? ? ? ?  ?Progress Toward Goals ? ?OT Goals(current goals can now be found in the care plan section) ? Progress towards OT goals: Progressing toward goals ? ?Acute Rehab OT Goals ?Patient Stated Goal: be indepednent ?OT Goal Formulation: With patient ?Time For Goal Achievement: 02/21/22 ?Potential to Achieve Goals: Good  ?Plan Discharge plan remains appropriate;Frequency remains appropriate   ? ?Co-evaluation ? ? ?   ?  ?  ?  ?  ? ?  ?AM-PAC OT "6 Clicks" Daily Activity     ?Outcome Measure ? ? Help from another person eating meals?: None ?Help from another person taking care of personal grooming?: None ?Help from another person toileting, which includes using toliet, bedpan, or urinal?: A Little ?Help from another person bathing (including washing, rinsing, drying)?: A Lot ?Help from another person to put on and taking off regular upper body clothing?: A Little ?Help  from another person to put on and taking off regular lower body clothing?: A Lot ?6 Click Score: 18 ? ?  ?End of Session   ? ?OT Visit Diagnosis: Unsteadiness on feet (R26.81);Pain ?Pain - Right/Left: Right ?Pain - part of body: Knee ?  ?Activity Tolerance Patient tolerated treatment well ?  ?Patient Left in chair;with call bell/phone within reach;with chair alarm set ?  ?Nurse Communication   ?  ? ?   ? ?Time: 1287-8676 ?OT Time Calculation (min): 9 min ? ?Charges: OT General Charges ?$OT Visit: 1 Visit ?OT Treatments ?$Self Care/Home Management : 8-22 mins ? ?Arman Filter., MPH, MS, OTR/L ?ascom [phone] ?02/08/22, 12:47 PM ? ?

## 2022-02-08 NOTE — Progress Notes (Signed)
Physical Therapy Treatment ?Patient Details ?Name: [name] [name] ?MRN: [id number] ?DOB: 10-Apr-1961 ?Today's Date: 02/08/2022 ? ? ?History of Present Illness 61 yo M s/p R TKR. PMH significant for bipolar disorder, depression, schizophrenia, polysubstance use, accidental drug overdose, HTN, and stroke (2019) ? ?  ?PT Comments  ? ? Pt is making good progress towards goals with ability to ambulate in hallway this date with RN observation using RW. Pt received seated in recliner with feet down. R knee with increased edema. Educated on knee positioning and performed AAROM. Able to decreased knee extension from 15 to 9 degrees. Still lacking knee flexion with pt declining polar care and KI. Pt has met all PT goals and is safe to dc home, care team updated.  ?Recommendations for follow up therapy are one component of a multi-disciplinary discharge planning process, led by the attending physician.  Recommendations may be updated based on patient status, additional functional criteria and insurance authorization. ? ?Follow Up Recommendations ? Home health PT ?  ?  ?Assistance Recommended at Discharge Intermittent Supervision/Assistance  ?Patient can return home with the following A little help with walking and/or transfers;Assistance with cooking/housework;Assist for transportation;Help with stairs or ramp for entrance ?  ?Equipment Recommendations ? Rolling walker (2 wheels)  ?  ?Recommendations for Other Services   ? ? ?  ?Precautions / Restrictions Precautions ?Precautions: Fall ?Restrictions ?Weight Bearing Restrictions: Yes ?RLE Weight Bearing: Weight bearing as tolerated  ?  ? ?Mobility ? Bed Mobility ?  ?  ?  ?  ?  ?  ?  ?General bed mobility comments: not performed as recieved seated in recliner ?  ? ?Transfers ?  ?  ?  ?  ?  ?  ?  ?  ?  ?General transfer comment: N/T ?  ? ?Ambulation/Gait ?  ?  ?  ?  ?  ?  ?  ?General Gait Details: pt reports he ambulated around RN station with RN observation. Was safe using RW. Too  fatigued to complete further ambulation this session. ? ? ?Stairs ?  ?  ?  ?  ?  ? ? ?Wheelchair Mobility ?  ? ?Modified Rankin (Stroke Patients Only) ?  ? ? ?  ?Balance Overall balance assessment: Needs assistance ?Sitting-balance support: No upper extremity supported, Feet supported ?Sitting balance-Leahy Scale: Good ?  ?  ?  ?  ?  ?  ?  ?  ?  ?  ?  ?  ?  ?  ?  ?  ?  ? ?  ?Cognition Arousal/Alertness: Awake/alert ?Behavior During Therapy: [facility] for tasks assessed/performed ?Overall Cognitive Status: Within Functional Limits for tasks assessed ?  ?  ?  ?  ?  ?  ?  ?  ?  ?  ?  ?  ?  ?  ?  ?  ?General Comments: motivated and pleasant throughout session ?  ?  ? ?  ?Exercises Total Joint Exercises ?Goniometric ROM: R knee AAROM: 15-70 degrees ?Other Exercises ?Other Exercises: reviewed HEP, reports he has already performed this AM. Performed seated R knee flexion due to decreased ROM noted. Also performed knee extension stretch with pillow under ankle. Improved ROM from 15-9 degrees post stretching. ? ?  ?General Comments   ?  ?  ? ?Pertinent Vitals/Pain Pain Assessment ?Pain Assessment: 0-10 ?Pain Score: 7  ?Pain Location: R knee ?Pain Descriptors / Indicators: Aching, Sharp, Shooting ?Pain Intervention(s): Limited activity within patient's tolerance, Repositioned, Premedicated before session  ? ? ?Home Living   ?  ?  ?  ?  ?  ?  ?  ?  ?  ?   ?  ?  Prior Function    ?  ?  ?   ? ?PT Goals (current goals can now be found in the care plan section) Acute Rehab PT Goals ?Patient Stated Goal: to go home ?PT Goal Formulation: With patient ?Time For Goal Achievement: 02/21/22 ?Potential to Achieve Goals: Good ?Progress towards PT goals: Progressing toward goals ? ?  ?Frequency ? ? ? BID ? ? ? ?  ?PT Plan Current plan remains appropriate  ? ? ?Co-evaluation   ?  ?  ?  ?  ? ?  ?AM-PAC PT "6 Clicks" Mobility   ?Outcome Measure ? Help needed turning from your back to your side while in a flat bed without using bedrails?: None ?Help  needed moving from lying on your back to sitting on the side of a flat bed without using bedrails?: A Little ?Help needed moving to and from a bed to a chair (including a wheelchair)?: A Little ?Help needed standing up from a chair using your arms (e.g., wheelchair or bedside chair)?: A Little ?Help needed to walk in hospital room?: A Little ?Help needed climbing 3-5 steps with a railing? : A Lot ?6 Click Score: 18 ? ?  ?End of Session Equipment Utilized During Treatment: Gait belt ?Activity Tolerance: Patient tolerated treatment well ?Patient left: in chair (with leg in extension with pillow under ankle. Currently declining KI and polar care.) ?Nurse Communication: Mobility status ?PT Visit Diagnosis: Muscle weakness (generalized) (M62.81);Difficulty in walking, not elsewhere classified (R26.2);Pain ?Pain - Right/Left: Right ?Pain - part of body: Knee ?  ? ? ?Time: 6283-6629 ?PT Time Calculation (min) (ACUTE ONLY): 10 min ? ?Charges:  $Therapeutic Exercise: 8-22 mins          ?          ? ?Greggory Stallion, PT, DPT, GCS ?[phone] ? ? ? ?Michial Disney ?02/08/2022, 9:55 AM ? ?

## 2022-02-08 NOTE — Plan of Care (Signed)
?  Problem: Activity: ?Goal: Range of joint motion will improve ?Outcome: Adequate for Discharge ?  ?Problem: Pain Management: ?Goal: Pain level will decrease with appropriate interventions ?Outcome: Adequate for Discharge ?  ?

## 2022-02-08 NOTE — Plan of Care (Signed)
?  Problem: Activity: ?Goal: Range of joint motion will improve ?Outcome: Progressing ?  ?Problem: Pain Management: ?Goal: Pain level will decrease with appropriate interventions ?Outcome: Progressing ?  ?Problem: Education: ?Goal: Knowledge of the prescribed therapeutic regimen will improve ?Outcome: Completed/Met ?  ?Problem: Activity: ?Goal: Ability to avoid complications of mobility impairment will improve ?Outcome: Completed/Met ?  ?Problem: Skin Integrity: ?Goal: Will show signs of wound healing ?Outcome: Completed/Met ?  ?Problem: Education: ?Goal: Knowledge of General Education information will improve ?Description: Including pain rating scale, medication(s)/side effects and non-pharmacologic comfort measures ?Outcome: Completed/Met ?  ?Problem: Health Behavior/Discharge Planning: ?Goal: Ability to manage health-related needs will improve ?Outcome: Completed/Met ?  ?Problem: Clinical Measurements: ?Goal: Will remain free from infection ?Outcome: Completed/Met ?  ?Problem: Activity: ?Goal: Risk for activity intolerance will decrease ?Outcome: Completed/Met ?  ?Problem: Pain Managment: ?Goal: General experience of comfort will improve ?Outcome: Completed/Met ?  ?

## 2022-02-08 NOTE — Progress Notes (Addendum)
?  Subjective: ? ?POD #2 s/p total knee arthroplasty.   Patient reports right knee pain as moderate.  Pain has improved since yesterday.  Patient was able to walk out in the hallway with physical therapy. ? ?Objective:  ? ?VITALS:   ?Vitals:  ? 02/07/22 1558 02/08/22 0503 02/08/22 0803 02/08/22 1206  ?BP: 137/79 130/71 129/78 138/82  ?Pulse: 76 97 87 92  ?Resp: 16 18 16 16   ?Temp: 98 ?F (36.7 ?C) 98 ?F (36.7 ?C) 98.5 ?F (36.9 ?C) 98.1 ?F (36.7 ?C)  ?TempSrc:      ?SpO2: 100% 100% 93% 100%  ?Weight:      ?Height:      ? ? ?PHYSICAL EXAM: ?Right lower extremity: ?I personally moved the patient's outer dressing.  Patient has moderate serosanguineous drainage under his Aquacel dressing.  The Aquacel dressing was left in place. ?Neurovascular intact ?Sensation intact distally ?Intact pulses distally ?Dorsiflexion/Plantar flexion intact ?No cellulitis present ?Compartment soft ? ?LABS ? ?Results for orders placed or performed during the hospital encounter of 02/06/22 (from the past 24 hour(s))  ?CBC     Status: Abnormal  ? Collection Time: 02/08/22  4:20 AM  ?Result Value Ref Range  ? WBC 8.1 4.0 - 10.5 K/uL  ? RBC 3.93 (L) 4.22 - 5.81 MIL/uL  ? Hemoglobin 10.7 (L) 13.0 - 17.0 g/dL  ? HCT 33.5 (L) 39.0 - 52.0 %  ? MCV 85.2 80.0 - 100.0 fL  ? MCH 27.2 26.0 - 34.0 pg  ? MCHC 31.9 30.0 - 36.0 g/dL  ? RDW 14.0 11.5 - 15.5 %  ? Platelets 228 150 - 400 K/uL  ? nRBC 0.0 0.0 - 0.2 %  ? ? ?No results found. ? ?Assessment/Plan: ?2 Days Post-Op  ? ?Principal Problem: ?  S/P TKR (total knee replacement) using cement, right ? ? ?Patient is doing well postop.  He has made progress with physical therapy.  He is ready for discharge home.  He will receive home health physical therapy.  Patient will remain on enteric-coated aspirin 325 mg p.o. twice daily for DVT prophylaxis.  He will follow-up with me on 02-19-2022 11:30 AM, Timoteo Gaul, MD. ? ? ?Thornton Park , MD ?02/08/2022, 2:00 PM ? ? ? ? ?  ?

## 2022-02-08 NOTE — Progress Notes (Signed)
Patient resting in recliner. Patient complaining of pain, medication given. Polar care applied to right knee, educated patient on polar care. Foot rest elevated on recliner. Patient instructed to call for assistance. Call bell in reach, possessions in reach, patient repositioned in recliner. No needs at this time, will continue to round on patient per unit routine.  ?

## 2022-02-08 NOTE — Discharge Summary (Signed)
Physician Discharge Summary  ?Patient ID: ?[name] SproutAnthony [name] ?MRN: [id number] ?DOB/AGE: 05/04/1961 61 y.o. ? ?Admit date: 02/06/2022 ?Discharge date: 02/08/2022 ? ?Admission Diagnoses:  ?Right Knee Osteoarthritis ?S/P TKR (total knee replacement) using cement, right ? ?Discharge Diagnoses:  ?Right Knee Osteoarthritis ?Principal Problem: ?  S/P TKR (total knee replacement) using cement, right ? ? ?Past Medical History:  ?Diagnosis Date  ? Accidental drug overdose   ? Arthritis of knee   ? Bipolar 1 disorder (HCC)   ? 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HCC)   ? History of 2019 novel coronavirus disease (COVID-19) 07/02/2021  ? Homicidal ideation   ? Hypertension   ? Malingering   ? MDD (major depressive disorder)   ? Polysubstance abuse (HCC)   ? a.) THC, cocaine, heroin, ETOH, tobacco. b.) "died" for 3 minutes following use of cocaine that was laced with fentanyl  ? Schizoaffective disorder (HCC)   ? Schizophrenia (HCC)   ? Severe alcohol use disorder (HCC)   ? Stroke [facility](HCC)   ? 2019  ? Suicidal ideation 04/13/2015  ? a.) SI (+) plan to "jump in front of a train"  ? ? ?Surgeries: Procedure(s): ?TOTAL KNEE ARTHROPLASTY on 02/06/2022 ?  ?Consultants (if any): Treatment Team:  ?Juanell FairlyKrasinski, Adriahna Shearman, MD ? ?Discharged Condition: Improved ? ?Hospital Course: [name] Sproutnthony Heather is an 61 y.o. male who was admitted 02/06/2022 with a diagnosis of  Right Knee Osteoarthritis S/P TKR (total knee replacement) using cement, right and went to the operating room on 02/06/2022 and underwent an uncomplicated right total knee arthroplasty.   ? ?He was given perioperative antibiotics:  ?Anti-infectives (From admission, onward)  ? ? Start     Dose/Rate Route Frequency Ordered Stop  ? 02/06/22 1615  ceFAZolin (ANCEF) IVPB 2g/100 mL premix       ? 2 g ?200 mL/hr over 30 Minutes Intravenous Every 6 hours 02/06/22 1522 02/07/22 0631  ? 02/06/22 0900  clindamycin (CLEOCIN) IVPB 600 mg       ? 600 mg ?100 mL/hr over 30 Minutes Intravenous  Once  02/06/22 0851 02/06/22 0937  ? 02/06/22 0858  clindamycin (CLEOCIN) 600 MG/50ML IVPB       ?Note to Pharmacy: Mikey BussingLewis, Cindy P: cabinet override  ?    02/06/22 0858 02/06/22 0927  ? 02/06/22 0858  ceFAZolin (ANCEF) 2-4 GM/100ML-% IVPB       ?Note to Pharmacy: Mikey BussingLewis, Cindy P: cabinet override  ?    02/06/22 0858 02/06/22 0926  ? 02/06/22 0851  ceFAZolin (ANCEF) IVPB 2g/100 mL premix       ? 2 g ?200 mL/hr over 30 Minutes Intravenous 30 min pre-op 02/06/22 0851 02/06/22 0922  ? ?  ?. ? ?He was given sequential compression devices, early ambulation, and Lovenox for DVT prophylaxis. ? ?Patient was admitted to the hospital and completed 24 hours of postop antibiotics.  He made expected progress with physical therapy during his inpatient stay.  Given his clinical improvement he is prepared for discharge home on postop day #2 after his pain was controlled. ? ?He benefited maximally from the hospital stay and there were no complications.   ? ?Recent vital signs:  ?Vitals:  ? 02/08/22 0803 02/08/22 1206  ?BP: 129/78 138/82  ?Pulse: 87 92  ?Resp: 16 16  ?Temp: 98.5 ?F (36.9 ?C) 98.1 ?F (36.7 ?C)  ?SpO2: 93% 100%  ? ? ?Recent laboratory studies:  ?Lab Results  ?Component Value Date  ? HGB 10.7 (L) 02/08/2022  ? HGB 11.7 (L) 02/07/2022  ?  HGB 13.2 02/06/2022  ? ?Lab Results  ?Component Value Date  ? WBC 8.1 02/08/2022  ? PLT 228 02/08/2022  ? ?Lab Results  ?Component Value Date  ? INR 1.0 02/01/2022  ? ?Lab Results  ?Component Value Date  ? NA 135 02/07/2022  ? K 3.9 02/07/2022  ? CL 104 02/07/2022  ? CO2 25 02/07/2022  ? BUN 19 02/07/2022  ? CREATININE 1.05 02/07/2022  ? GLUCOSE 117 (H) 02/07/2022  ? ? ?Discharge Medications:   ?Allergies as of 02/08/2022   ?No Known Allergies ?  ? ?  ?Medication List  ?  ? ?STOP taking these medications   ? ?amLODipine 5 MG tablet ?Commonly known as: NORVASC ?  ?atorvastatin 20 MG tablet ?Commonly known as: LIPITOR ?  ?clopidogrel 75 MG tablet ?Commonly known as: PLAVIX ?  ?FLUoxetine 10 MG  capsule ?Commonly known as: PROZAC ?  ?hydrOXYzine 25 MG tablet ?Commonly known as: ATARAX ?  ?QUEtiapine 100 MG tablet ?Commonly known as: SEROQUEL ?  ?traZODone 50 MG tablet ?Commonly known as: DESYREL ?  ? ?  ? ?TAKE these medications   ? ?acetaminophen 325 MG tablet ?Commonly known as: TYLENOL ?Take 1-2 tablets (325-650 mg total) by mouth every 6 (six) hours as needed for mild pain (pain score 1-3 or temp > 100.5). ?  ?aspirin EC 325 MG tablet ?Take 1 tablet (325 mg total) by mouth 2 (two) times daily. ?What changed:  ?medication strength ?how much to take ?when to take this ?additional instructions ?  ?gabapentin 600 MG tablet ?Commonly known as: NEURONTIN ?Take 600 mg by mouth 3 (three) times daily. ?  ?lisinopril 40 MG tablet ?Commonly known as: ZESTRIL ?Take 40 mg by mouth daily. ?  ?nicotine 21 mg/24hr patch ?Commonly known as: NICODERM CQ - dosed in mg/24 hours ?Place 1 patch (21 mg total) onto the skin daily. ?Start taking on: February 09, 2022 ?What changed:  ?additional instructions ?how Bole to infuse this ?  ?oxyCODONE 5 MG immediate release tablet ?Commonly known as: Oxy IR/ROXICODONE ?Take 1-2 tablets (5-10 mg total) by mouth every 4 (four) hours as needed for moderate pain (pain score 4-6). ?  ? ?  ? ?  ?  ? ? ?  ?Durable Medical Equipment  ?(From admission, onward)  ?  ? ? ?  ? ?  Start     Ordered  ? 02/07/22 1341  For home use only DME Tub bench  Once       ? 02/07/22 1341  ? 02/07/22 1204  For home use only DME Walker rolling  Once       ?Question Answer Comment  ?Walker: With 5 Inch Wheels   ?Patient needs a walker to treat with the following condition S/P TKR (total knee replacement)   ?  ? 02/07/22 1206  ? ?  ?  ? ?  ? ? ?Diagnostic Studies: DG Knee Right Port ? ?Result Date: 02/06/2022 ?CLINICAL DATA:  Right knee replacement EXAM: PORTABLE RIGHT KNEE - 1-2 VIEW COMPARISON:  None. FINDINGS: Right knee demonstrates a total knee arthroplasty without evidence of hardware failure complication. No  significant joint effusion. No fracture or dislocation. Alignment is anatomic. Surgical drains are present. Post-surgical changes noted in the surrounding soft tissues. Peripheral vascular atherosclerotic disease. IMPRESSION: 1. Interval right total knee arthroplasty. Electronically Signed   By: Elige Ko M.D.   On: 02/06/2022 13:06   ? ?Disposition: Discharge disposition: 01-Home or Self Care ? ? ? ? ? ? ?Discharge Instructions   ? ?  Call MD / Call 911   Complete by: As directed ?  ? If you experience chest pain or shortness of breath, CALL 911 and be transported to the hospital emergency room.  If you develope a fever above 101 F, pus (white drainage) or increased drainage or redness at the wound, or calf pain, call your surgeon's office.  ? Constipation Prevention   Complete by: As directed ?  ? Drink plenty of fluids.  Prune juice may be helpful.  You may use a stool softener, such as Colace (over the counter) 100 mg twice a day.  Use MiraLax (over the counter) for constipation as needed.  ? Diet general   Complete by: As directed ?  ? Discharge instructions   Complete by: As directed ?  ? Patient will follow up with Dr. Martha Clan in the office on04-08-2022 11:30 AM ? ?The patient may continue to bear weight on the right lower extremity with use of a walker. The patient should continue to use TED stockings until follow-up. Patient should remove the TED stockings at night for sleep. The patient needs to continue to elevate the right lower extremity whenever possible. The knee immobilizer should be used at night. The patient may remove the knee immobilizer to perform exercises or sit in a chair during the day.  Patient should not place a pillow under their knee. The Polar Care may be used by the patient for comfort.  The dressing should remain on until follow up in the office.   The patient must cover the right knee dressing/incision during showers with a plastic bag or Saran wrap.  The patient will take  aspirin 325 mg twice a day for blood clot prevention and continue to work on knee range of motion exercises at home as instructed by physical therapy until follow-up in the office.  ? Driving restrictions   Comple

## 2022-07-05 ENCOUNTER — Other Ambulatory Visit: Payer: Self-pay | Admitting: Orthopedic Surgery

## 2022-07-09 ENCOUNTER — Encounter
Admission: RE | Admit: 2022-07-09 | Discharge: 2022-07-09 | Disposition: A | Discharge status: Home / Self Care | Payer: Medicare Other | Source: Ambulatory Visit | Attending: Orthopedic Surgery | Admitting: Orthopedic Surgery

## 2022-07-09 DIAGNOSIS — Z01818 Encounter for other preprocedural examination: Secondary | ICD-10-CM

## 2022-07-09 DIAGNOSIS — F1410 Cocaine abuse, uncomplicated: Secondary | ICD-10-CM

## 2022-07-09 DIAGNOSIS — F1210 Cannabis abuse, uncomplicated: Secondary | ICD-10-CM

## 2022-07-09 MED ORDER — CHLORHEXIDINE GLUCONATE 0.12 % MT SOLN
15.0000 mL | Freq: Once | OROMUCOSAL | Status: AC
  Administered 2022-07-10: 15 mL via OROMUCOSAL

## 2022-07-09 MED ORDER — LACTATED RINGERS IV SOLN: Freq: Once | INTRAVENOUS | Status: AC

## 2022-07-09 MED ORDER — CHLORHEXIDINE GLUCONATE CLOTH 2 % EX PADS: 6.0000 | MEDICATED_PAD | Freq: Once | CUTANEOUS | Status: DC

## 2022-07-09 MED ORDER — ORAL CARE MOUTH RINSE: 15.0000 mL | Freq: Once | OROMUCOSAL | Status: AC

## 2022-07-09 MED ORDER — FAMOTIDINE 20 MG PO TABS
20.0000 mg | ORAL_TABLET | Freq: Once | ORAL | Status: AC
  Administered 2022-07-10: 20 mg via ORAL

## 2022-07-09 NOTE — Patient Instructions (Signed)
Your procedure is scheduled on:07-10-22 Tuesday Report to the Registration Desk on the 1st floor of the [facility]. To find out your arrival time, please call [phone] between 1PM - 3PM on:07-09-22 Monday If your arrival time is 6:00 am, do not arrive prior to that time as the [facility] entrance doors do not open until 6:00 am.  REMEMBER: Instructions that are not followed completely may result in serious medical risk, up to and including death; or upon the discretion of your surgeon and anesthesiologist your surgery may need to be rescheduled.  Do not eat food OR drink any liquids after midnight the night before surgery.  No gum chewing, lozengers or hard candies.  TAKE THESE MEDICATIONS THE MORNING OF SURGERY WITH A SIP OF WATER: -gabapentin (NEURONTIN)  One week prior to surgery: Stop Anti-inflammatories (NSAIDS) such as Advil, Aleve, Ibuprofen, Motrin, Naproxen, Naprosyn and Aspirin based products such as Excedrin, Goodys Powder, BC Powder. You may however, continue to take Tylenol if needed for pain up until the day of surgery.  No Alcohol for 24 hours before or after surgery.  No Smoking including e-cigarettes for 24 hours prior to surgery.  No chewable tobacco products for at least 6 hours prior to surgery.  No nicotine patches on the day of surgery.  Do not use any "recreational" drugs for at least a week prior to your surgery.  Please be advised that the combination of cocaine and anesthesia may have negative outcomes, up to and including death. If you test positive for cocaine, your surgery will be cancelled.  On the morning of surgery brush your teeth with toothpaste and water, you may rinse your mouth with mouthwash if you wish. Do not swallow any toothpaste or mouthwash.  Do not wear jewelry, make-up, hairpins, clips or nail polish.  Do not wear lotions, powders, or perfumes.   Do not shave body from the neck down 48 hours prior to surgery just in case you cut  yourself which could leave a site for infection.  Also, freshly shaved skin may become irritated if using the CHG soap.  Contact lenses, hearing aids and dentures may not be worn into surgery.  Do not bring valuables to the hospital. [facility] is not responsible for any missing/lost belongings or valuables.   Notify your doctor if there is any change in your medical condition (cold, fever, infection).  Wear comfortable clothing (specific to your surgery type) to the hospital.  After surgery, you can help prevent lung complications by doing breathing exercises.  Take deep breaths and cough every 1-2 hours. Your doctor may order a device called an Incentive Spirometer to help you take deep breaths. When coughing or sneezing, hold a pillow firmly against your incision with both hands. This is called "splinting." Doing this helps protect your incision. It also decreases belly discomfort.  If you are being admitted to the hospital overnight, leave your suitcase in the car. After surgery it may be brought to your room.  If you are being discharged the day of surgery, you will not be allowed to drive home. You will need a responsible adult (18 years or older) to drive you home and stay with you that night.   If you are taking public transportation, you will need to have a responsible adult (18 years or older) with you. Please confirm with your physician that it is acceptable to use public transportation.   Please call the Pre-admissions Testing Dept. at [phone] if you have  any questions about these instructions.  Surgery Visitation Policy:  Patients undergoing a surgery or procedure may have two family members or support persons with them as [place] as the person is not COVID-19 positive or experiencing its symptoms.

## 2022-07-10 ENCOUNTER — Other Ambulatory Visit: Payer: Self-pay

## 2022-07-10 ENCOUNTER — Ambulatory Visit
Admission: RE | Admit: 2022-07-10 | Discharge: 2022-07-10 | Disposition: A | Discharge status: Home / Self Care | Payer: Medicare Other | Attending: Orthopedic Surgery | Admitting: Orthopedic Surgery

## 2022-07-10 ENCOUNTER — Ambulatory Visit: Payer: Medicare Other | Admitting: Anesthesiology

## 2022-07-10 ENCOUNTER — Encounter: Payer: Self-pay | Admitting: Orthopedic Surgery

## 2022-07-10 ENCOUNTER — Encounter
Admission: RE | Disposition: A | Discharge status: Home / Self Care | Payer: Self-pay | Source: Home / Self Care | Attending: Orthopedic Surgery

## 2022-07-10 DIAGNOSIS — Z79899 Other long term (current) drug therapy: Secondary | ICD-10-CM | POA: Insufficient documentation

## 2022-07-10 DIAGNOSIS — M24661 Ankylosis, right knee: Secondary | ICD-10-CM | POA: Insufficient documentation

## 2022-07-10 DIAGNOSIS — I69398 Other sequelae of cerebral infarction: Secondary | ICD-10-CM | POA: Insufficient documentation

## 2022-07-10 DIAGNOSIS — Z96651 Presence of right artificial knee joint: Secondary | ICD-10-CM | POA: Diagnosis not present

## 2022-07-10 DIAGNOSIS — I10 Essential (primary) hypertension: Secondary | ICD-10-CM | POA: Insufficient documentation

## 2022-07-10 DIAGNOSIS — F17210 Nicotine dependence, cigarettes, uncomplicated: Secondary | ICD-10-CM | POA: Diagnosis not present

## 2022-07-10 DIAGNOSIS — Z01818 Encounter for other preprocedural examination: Secondary | ICD-10-CM

## 2022-07-10 DIAGNOSIS — F319 Bipolar disorder, unspecified: Secondary | ICD-10-CM | POA: Diagnosis not present

## 2022-07-10 DIAGNOSIS — F259 Schizoaffective disorder, unspecified: Secondary | ICD-10-CM | POA: Insufficient documentation

## 2022-07-10 DIAGNOSIS — F1410 Cocaine abuse, uncomplicated: Secondary | ICD-10-CM

## 2022-07-10 DIAGNOSIS — F1210 Cannabis abuse, uncomplicated: Secondary | ICD-10-CM

## 2022-07-10 HISTORY — PX: KNEE CLOSED REDUCTION: SHX995

## 2022-07-10 LAB — URINE DRUG SCREEN, QUALITATIVE (ARMC ONLY)
Amphetamines, Ur Screen: NOT DETECTED
Barbiturates, Ur Screen: NOT DETECTED
Benzodiazepine, Ur Scrn: NOT DETECTED
Cannabinoid 50 Ng, Ur ~~LOC~~: NOT DETECTED
Cocaine Metabolite,Ur ~~LOC~~: NOT DETECTED
MDMA (Ecstasy)Ur Screen: NOT DETECTED
Methadone Scn, Ur: NOT DETECTED
Opiate, Ur Screen: NOT DETECTED
Phencyclidine (PCP) Ur S: NOT DETECTED
Tricyclic, Ur Screen: NOT DETECTED

## 2022-07-10 SURGERY — MANIPULATION, KNEE, CLOSED
Anesthesia: General | Site: Knee | Laterality: Right

## 2022-07-10 MED ORDER — ROCURONIUM BROMIDE 10 MG/ML (PF) SYRINGE
PREFILLED_SYRINGE | INTRAVENOUS | Status: AC
  Filled 2022-07-10: qty 10

## 2022-07-10 MED ORDER — OXYCODONE HCL 5 MG PO TABS: 5.0000 mg | ORAL_TABLET | ORAL | 0 refills | Status: DC | PRN

## 2022-07-10 MED ORDER — PROPOFOL 1000 MG/100ML IV EMUL
INTRAVENOUS | Status: AC
  Filled 2022-07-10: qty 100

## 2022-07-10 MED ORDER — CHLORHEXIDINE GLUCONATE 0.12 % MT SOLN
OROMUCOSAL | Status: AC
  Filled 2022-07-10: qty 15

## 2022-07-10 MED ORDER — ONDANSETRON HCL 4 MG/2ML IJ SOLN
INTRAMUSCULAR | Status: DC | PRN
  Administered 2022-07-10: 4 mg via INTRAVENOUS

## 2022-07-10 MED ORDER — LIDOCAINE HCL (CARDIAC) PF 100 MG/5ML IV SOSY
PREFILLED_SYRINGE | INTRAVENOUS | Status: DC | PRN
  Administered 2022-07-10: 100 mg via INTRAVENOUS

## 2022-07-10 MED ORDER — ONDANSETRON HCL 4 MG/2ML IJ SOLN
INTRAMUSCULAR | Status: AC
  Filled 2022-07-10: qty 4

## 2022-07-10 MED ORDER — PROPOFOL 10 MG/ML IV BOLUS
INTRAVENOUS | Status: AC
  Filled 2022-07-10: qty 20

## 2022-07-10 MED ORDER — FENTANYL CITRATE (PF) 100 MCG/2ML IJ SOLN
25.0000 ug | INTRAMUSCULAR | Status: DC | PRN
  Administered 2022-07-10 (×2): 50 ug via INTRAVENOUS

## 2022-07-10 MED ORDER — SUGAMMADEX SODIUM 200 MG/2ML IV SOLN
INTRAVENOUS | Status: DC | PRN
  Administered 2022-07-10: 200 mg via INTRAVENOUS

## 2022-07-10 MED ORDER — PHENYLEPHRINE HCL (PRESSORS) 10 MG/ML IV SOLN
INTRAVENOUS | Status: DC | PRN
  Administered 2022-07-10: 160 ug via INTRAVENOUS

## 2022-07-10 MED ORDER — ROCURONIUM BROMIDE 100 MG/10ML IV SOLN
INTRAVENOUS | Status: DC | PRN
  Administered 2022-07-10: 30 mg via INTRAVENOUS

## 2022-07-10 MED ORDER — FENTANYL CITRATE (PF) 100 MCG/2ML IJ SOLN
INTRAMUSCULAR | Status: AC
  Filled 2022-07-10: qty 2

## 2022-07-10 MED ORDER — PHENYLEPHRINE 80 MCG/ML (10ML) SYRINGE FOR IV PUSH (FOR BLOOD PRESSURE SUPPORT)
PREFILLED_SYRINGE | INTRAVENOUS | Status: AC
  Filled 2022-07-10: qty 10

## 2022-07-10 MED ORDER — FENTANYL CITRATE (PF) 100 MCG/2ML IJ SOLN
INTRAMUSCULAR | Status: DC | PRN
  Administered 2022-07-10: 50 ug via INTRAVENOUS
  Administered 2022-07-10 (×2): 25 ug via INTRAVENOUS

## 2022-07-10 MED ORDER — LIDOCAINE HCL (PF) 2 % IJ SOLN
INTRAMUSCULAR | Status: AC
  Filled 2022-07-10: qty 10

## 2022-07-10 MED ORDER — PROPOFOL 500 MG/50ML IV EMUL
INTRAVENOUS | Status: DC | PRN
  Administered 2022-07-10: 150 ug/kg/min via INTRAVENOUS

## 2022-07-10 MED ORDER — MIDAZOLAM HCL 2 MG/2ML IJ SOLN
INTRAMUSCULAR | Status: AC
  Filled 2022-07-10: qty 2

## 2022-07-10 MED ORDER — DEXAMETHASONE SODIUM PHOSPHATE 10 MG/ML IJ SOLN
INTRAMUSCULAR | Status: AC
  Filled 2022-07-10: qty 2

## 2022-07-10 MED ORDER — SUCCINYLCHOLINE CHLORIDE 200 MG/10ML IV SOSY
PREFILLED_SYRINGE | INTRAVENOUS | Status: AC
  Filled 2022-07-10: qty 10

## 2022-07-10 MED ORDER — DEXAMETHASONE SODIUM PHOSPHATE 10 MG/ML IJ SOLN
INTRAMUSCULAR | Status: DC | PRN
  Administered 2022-07-10: 4 mg via INTRAVENOUS

## 2022-07-10 MED ORDER — MIDAZOLAM HCL 2 MG/2ML IJ SOLN
INTRAMUSCULAR | Status: DC | PRN
  Administered 2022-07-10: 2 mg via INTRAVENOUS

## 2022-07-10 MED ORDER — FAMOTIDINE 20 MG PO TABS
ORAL_TABLET | ORAL | Status: AC
  Filled 2022-07-10: qty 1

## 2022-07-10 MED ORDER — SUCCINYLCHOLINE CHLORIDE 200 MG/10ML IV SOSY
PREFILLED_SYRINGE | INTRAVENOUS | Status: DC | PRN
  Administered 2022-07-10: 100 mg via INTRAVENOUS

## 2022-07-10 MED ORDER — SODIUM CHLORIDE 0.9 % IV SOLN: INTRAVENOUS | Status: DC | PRN

## 2022-07-10 MED ORDER — PROPOFOL 10 MG/ML IV BOLUS
INTRAVENOUS | Status: DC | PRN
  Administered 2022-07-10 (×2): 50 mg via INTRAVENOUS

## 2022-07-10 SURGICAL SUPPLY — 4 items
KIT TURNOVER KIT A (KITS) ×1 IMPLANT
MANIFOLD NEPTUNE II (INSTRUMENTS) ×1 IMPLANT
TRAP FLUID SMOKE EVACUATOR (MISCELLANEOUS) ×1 IMPLANT
WATER STERILE IRR 500ML POUR (IV SOLUTION) ×1 IMPLANT

## 2022-07-10 NOTE — Transfer of Care (Signed)
Immediate Anesthesia Transfer of Care Note  Patient: [name] [name]  Procedure(s) Performed: CLOSED MANIPULATION KNEE (Right: Knee)  Patient Location: PACU  Anesthesia Type:General  Level of Consciousness: drowsy and patient cooperative  Airway & Oxygen Therapy: Patient Spontanous Breathing and Patient connected to face mask oxygen  Post-op Assessment: Report given to RN and Post -op Vital signs reviewed and stable  Post vital signs: Reviewed and stable  Last Vitals:  Vitals Value Taken Time  BP 117/84 07/10/22 0850  Temp    Pulse 79 07/10/22 0854  Resp 12 07/10/22 0854  SpO2 99 % 07/10/22 0854  Vitals shown include unvalidated device data.  Last Pain:  Vitals:   07/10/22 0703  TempSrc: Temporal  PainSc: 9          Complications: No notable events documented.

## 2022-07-10 NOTE — Anesthesia Preprocedure Evaluation (Signed)
Anesthesia Evaluation  Patient identified by MRN, date of birth, ID band Patient awake    Reviewed: Allergy & Precautions, NPO status , Patient's Chart, lab work & pertinent test results  History of Anesthesia Complications Negative for: history of anesthetic complications  Airway Mallampati: II  TM Distance: >3 FB Neck ROM: Full    Dental  (+) Poor Dentition, Missing, Dental Advidsory Given   Pulmonary neg shortness of breath, neg sleep apnea, neg COPD, neg recent URI, Current SmokerPatient did not abstain from smoking.,    Pulmonary exam normal breath sounds clear to auscultation       Cardiovascular Exercise Tolerance: Good METShypertension, Pt. on medications (-) angina(-) CAD and (-) Past MI (-) dysrhythmias  Rhythm:Regular Rate:Normal - Systolic murmurs TTE 2019: - Left ventricle: The cavity size was mildly dilated. Wall  thickness was normal. Systolic function was normal. The estimated  ejection fraction was in the range of 50% to 55%.  - Mitral valve: There was mild regurgitation.    Neuro/Psych neg Seizures PSYCHIATRIC DISORDERS Depression Bipolar Disorder Schizophrenia Residual left fingertip numbness CVA, Residual Symptoms    GI/Hepatic neg GERD  ,(+)     substance abuse  alcohol use and marijuana use, Hx cocaine use, sober for 2 years per patient.   Endo/Other  neg diabetes  Renal/GU negative Renal ROS     Musculoskeletal   Abdominal   Peds  Hematology   Anesthesia Other Findings Past Medical History: No date: Accidental drug overdose No date: Arthritis of knee No date: Bipolar 1 disorder (HCC) No date: 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HCC) 07/02/2021: History of 2019 novel coronavirus disease (COVID-19) No date: Homicidal ideation No date: Hypertension No date: Malingering No date: MDD (major depressive disorder) No date: Polysubstance abuse (HCC)     Comment:  a.)  THC, cocaine, heroin, ETOH, tobacco. b.) "died" for               3 minutes following use of cocaine that was laced with               fentanyl No date: Schizoaffective disorder (HCC) No date: Schizophrenia (HCC) No date: Severe alcohol use disorder (HCC) No date: Stroke [facility])     Comment:  2019 04/13/2015: Suicidal ideation     Comment:  a.) SI (+) plan to "jump in front of a train"  Reproductive/Obstetrics negative OB ROS                             Anesthesia Physical  Anesthesia Plan  ASA: 3  Anesthesia Plan: General   Post-op Pain Management: Ofirmev IV (intra-op)*   Induction: Intravenous  PONV Risk Score and Plan: 0 and Propofol infusion and TIVA  Airway Management Planned: Natural Airway and Mask  Additional Equipment: None  Intra-op Plan:   Post-operative Plan:   Informed Consent: I have reviewed the patients History and Physical, chart, labs and discussed the procedure including the risks, benefits and alternatives for the proposed anesthesia with the patient or authorized representative who has indicated his/her understanding and acceptance.       Plan Discussed with: CRNA and Surgeon  Anesthesia Plan Comments: (Discussed R/B/A of neuraxial anesthesia technique with patient: - rare risks of spinal/epidural hematoma, nerve damage, infection - Risk of PDPH - Risk of nausea and vomiting - Risk of conversion to general anesthesia and its associated risks, including sore throat, damage to lips/eyes/teeth/oropharynx, and rare risks such as cardiac  and respiratory events. - Risk of allergic reactions  Patient counseled on benefits of smoking cessation, and increased perioperative risks associated with continued smoking.  Discussed the role of CRNA in patient's perioperative care.  Patient voiced understanding.)        Anesthesia Quick Evaluation

## 2022-07-10 NOTE — Op Note (Addendum)
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  Patient had a delay in beginning physical therapy as an outpatient following discharge from the hospital.  Patient developed stiffness which plateaued at approximately 80 degrees of flexion and -10 degrees of extension.  Therefore the decision was made to perform a closed manipulation of the right knee under anesthesia.  I discussed the risks and benefits of surgery. The risks include but are not limited to infection, bleeding, nerve or blood vessel injury, joint stiffness or loss of motion, persistent pain, weakness or instability, fracture, dislocation and hardware failure and the need for further surgery.  Patient understood these risks and wished to proceed.   OPERATIVE FINDINGS: Significant loss of range of motion following right total knee arthroplasty.  Range of motion was -10 to 80 degrees.  OPERATIVE PROCEDURE: Patient was brought to the operating room.  He was laid supine on the operative table.  He underwent general anesthesia with muscle relaxation.  A timeout performed to verify the patient's name, date of birth, medical record number, correct site of surgery correct procedure to be performed.  Once all attendance were in agreement the case began.  Patient did not require IV antibiotics as this was a closed case.  Patient was flexed at the hip 90 degrees and pressure was placed in the proximal tibia and distal femur.  Patient was extremely tight and stiff without significant movement.  I asked my partner Dr. Cassell Smiles to come into the room to look at the knee as  well.  He helped to manipulate the right knee.  The manipulation provided increased flexion from 80 degrees to 100 degrees.  Patient with passive assistance could achieve -5 degrees of his right knee.    Patient was then awoken and brought to the PACU in stable condition.  I was present for the entire case.  Patient has physical therapy set up tomorrow and Thursday to continue working on knee range of motion.

## 2022-07-10 NOTE — Discharge Instructions (Signed)
AMBULATORY SURGERY  ?DISCHARGE INSTRUCTIONS ? ? ?The drugs that you were given will stay in your system until tomorrow so for the next 24 hours you should not: ? ?Drive an automobile ?Make any legal decisions ?Drink any alcoholic beverage ? ? ?You may resume regular meals tomorrow.  Today it is better to start with liquids and gradually work up to solid foods. ? ?You may eat anything you prefer, but it is better to start with liquids, then soup and crackers, and gradually work up to solid foods. ? ? ?Please notify your doctor immediately if you have any unusual bleeding, trouble breathing, redness and pain at the surgery site, drainage, fever, or pain not relieved by medication. ? ? ? ?Additional Instructions: ? ? ? ?Please contact your physician with any problems or Same Day Surgery at [phone], Monday through Friday 6 am to 4 pm, or [place] [facility] [place] Main number at [phone].  ?

## 2022-07-10 NOTE — H&P (Signed)
PREOPERATIVE H&P  Chief Complaint: Right Knee arthrofibrosis  HPI: [name] [name] is a 61 y.o. male who presents for preoperative history and physical with a diagnosis of Right Knee arthrofibrosis.  The patient had a delay in beginning his physical therapy following a right total knee arthroplasty on 3/38/23.  Right knee stiffness is significantly impairing activities of daily living.    Past Medical History:  Diagnosis Date   Accidental drug overdose    Arthritis of knee    Bipolar 1 disorder (HCC)    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HCC)    History of 2019 novel coronavirus disease (COVID-19) 07/02/2021   Homicidal ideation    Hypertension    Malingering    MDD (major depressive disorder)    Polysubstance abuse (HCC)    a.) THC, cocaine, heroin, ETOH, tobacco. b.) "died" for 3 minutes following use of cocaine that was laced with fentanyl   Schizoaffective disorder (HCC)    Schizophrenia (HCC)    Severe alcohol use disorder (HCC)    Stroke (HCC)    2019   Suicidal ideation 04/13/2015   a.) SI (+) plan to "jump in front of a train"   Past Surgical History:  Procedure Laterality Date   CHOLECYSTECTOMY N/A 12/14/2014   Procedure: LAPAROSCOPIC CHOLECYSTECTOMY WITH INTRAOPERATIVE CHOLANGIOGRAM;  Surgeon: Harriette Bouillon, MD;  Location: [facility];  Service: General;  Laterality: N/A;   HERNIA REPAIR     LEFT HEART CATH AND CORONARY ANGIOGRAPHY N/A 08/05/2018   Procedure: LEFT HEART CATH AND CORONARY ANGIOGRAPHY;  Surgeon: Corky Crafts, MD;  Location: [facility];  Service: Cardiovascular;  Laterality: N/A;   TOTAL KNEE ARTHROPLASTY Right 02/06/2022   Procedure: TOTAL KNEE ARTHROPLASTY;  Surgeon: Juanell Fairly, MD;  Location: [facility] ORS;  Service: Orthopedics;  Laterality: Right;   Social History   Socioeconomic History   Marital status: Single    Spouse name: Not on file   Number of children: 1   Years of education: Not on file   Highest  education level: Not on file  Occupational History   Not on file  Tobacco Use   Smoking status: Every Day    Packs/day: 1.00    Years: 35.00    Total pack years: 35.00    Types: Cigarettes   Smokeless tobacco: Never  Substance and Sexual Activity   Alcohol use: Yes    Alcohol/week: 2.0 standard drinks of alcohol    Types: 2 Cans of beer per week   Drug use: Not Currently    Types: Marijuana, Cocaine    Comment: pt states quit 2 years ago   Sexual activity: Not on file  Other Topics Concern   Not on file  Social History Narrative   Not on file   Social Determinants of Health   Financial Resource Strain: Not on file  Food Insecurity: Not on file  Transportation Needs: Not on file  Physical Activity: Not on file  Stress: Not on file  Social Connections: Not on file   Family History  Problem Relation Age of Onset   Heart attack Mother 70   Heart attack Father 77   No Known Allergies Prior to Admission medications   Medication Sig Start Date End Date Taking? Authorizing Provider  gabapentin (NEURONTIN) 600 MG tablet Take 600 mg by mouth 3 (three) times daily. 12/18/21  Yes [provider]  lisinopril (ZESTRIL) 40 MG tablet Take 40 mg by mouth every morning. 12/18/21  Yes [provider]  Positive ROS: All other systems have been reviewed and were otherwise negative with the exception of those mentioned in the HPI and as above.  Physical Exam: General: Alert, no acute distress Cardiovascular: Regular rate and rhythm, no murmurs rubs or gallops.  No pedal edema Respiratory: Clear to auscultation bilaterally, no wheezes rales or rhonchi. No cyanosis, no use of accessory musculature GI: No organomegaly, abdomen is soft and non-tender nondistended with positive bowel sounds. Skin: Skin intact, no lesions within the operative field. Neurologic: Sensation intact distally Psychiatric: Patient is competent for consent with normal mood and affect Lymphatic: No  cervical lymphadenopathy  MUSCULOSKELETAL: ROM -5 to 85 degrees.  Midline incision is well-healed.  Patient has no erythema ecchymosis or effusion.  He has no calf tenderness or lower leg edema.  His compartments are soft and compressible.  He is distally neurovascular intact.  Assessment: Right Knee arthrofibrosis  Plan: Plan for Procedure(s): CLOSED MANIPULATION RIGHT KNEE  I reviewed the details of the operation as well as the postoperative course with the patient.  A preop history and physical was performed at the bedside.  Patient is scheduled for physical therapy tomorrow and Thursday.  I discussed the risks and benefits of surgery. The risks include but are not limited to bruising, hemarthrosis, nerve or blood vessel injury, joint stiffness or loss of motion, persistent pain, weakness or instability, and the need for further surgery. Patient understood these risks and wished to proceed.     Juanell Fairly, MD   07/10/2022 7:54 AM

## 2022-07-13 NOTE — Anesthesia Postprocedure Evaluation (Signed)
Anesthesia Post Note  Patient: [name] [name]  Procedure(s) Performed: CLOSED MANIPULATION KNEE (Right: Knee)  Patient location during evaluation: PACU Anesthesia Type: General Level of consciousness: awake and alert Pain management: pain level controlled Vital Signs Assessment: post-procedure vital signs reviewed and stable Respiratory status: spontaneous breathing, nonlabored ventilation, respiratory function stable and patient connected to nasal cannula oxygen Cardiovascular status: blood pressure returned to baseline and stable Postop Assessment: no apparent nausea or vomiting Anesthetic complications: no   No notable events documented.   Last Vitals:  Vitals:   07/10/22 0926 07/10/22 0936  BP: (!) 166/96 (!) 150/96  Pulse: 72   Resp: 16   Temp: (!) 36.1 C   SpO2: 99%     Last Pain:  Vitals:   07/10/22 0926  TempSrc: Temporal  PainSc: 3                  Lenard Simmer

## 2022-12-05 LAB — CBC: RBC: 5.16 — AB (ref 3.87–5.11)

## 2022-12-05 LAB — LIPID PANEL
Cholesterol: 194 (ref 0–200)
HDL: 48 (ref 35–70)
LDL Cholesterol: 129
Triglycerides: 96 (ref 40–160)

## 2022-12-05 LAB — CBC AND DIFFERENTIAL
HCT: 45 (ref 41–53)
Hemoglobin: 14.3 (ref 13.5–17.5)
Platelets: 337 10*3/uL (ref 150–400)
WBC: 4.2

## 2022-12-05 LAB — HEPATIC FUNCTION PANEL
ALT: 19 U/L (ref 10–40)
AST: 22 (ref 14–40)
Alkaline Phosphatase: 115 (ref 25–125)
Bilirubin, Total: 0.4

## 2022-12-05 LAB — BASIC METABOLIC PANEL
BUN: 13 (ref 4–21)
CO2: 21 (ref 13–22)
Chloride: 103 (ref 99–108)
Creatinine: 1 (ref 0.6–1.3)
Glucose: 66
Potassium: 4.6 mEq/L (ref 3.5–5.1)
Sodium: 138 (ref 137–147)

## 2022-12-05 LAB — VITAMIN D 25 HYDROXY (VIT D DEFICIENCY, FRACTURES): Vit D, 25-Hydroxy: 16.8

## 2022-12-05 LAB — COMPREHENSIVE METABOLIC PANEL
Calcium: 9.6 (ref 8.7–10.7)
Globulin: 2.7
eGFR: 86

## 2022-12-11 DIAGNOSIS — E559 Vitamin D deficiency, unspecified: Secondary | ICD-10-CM | POA: Insufficient documentation

## 2022-12-11 DIAGNOSIS — G629 Polyneuropathy, unspecified: Secondary | ICD-10-CM | POA: Insufficient documentation

## 2022-12-11 DIAGNOSIS — E785 Hyperlipidemia, unspecified: Secondary | ICD-10-CM | POA: Insufficient documentation

## 2023-02-04 ENCOUNTER — Encounter: Payer: Self-pay | Admitting: Physician Assistant

## 2023-02-04 ENCOUNTER — Ambulatory Visit (INDEPENDENT_AMBULATORY_CARE_PROVIDER_SITE_OTHER): Payer: 59 | Admitting: Physician Assistant

## 2023-02-04 VITALS — BP 124/78 | HR 84 | Ht 66.0 in | Wt 183.0 lb

## 2023-02-04 DIAGNOSIS — B353 Tinea pedis: Secondary | ICD-10-CM

## 2023-02-04 MED ORDER — HYDROXYZINE PAMOATE 25 MG PO CAPS: 25.0000 mg | ORAL_CAPSULE | Freq: Three times a day (TID) | ORAL | 0 refills | Status: DC | PRN

## 2023-02-04 MED ORDER — TERBINAFINE HCL 1 % EX CREA: 1.0000 | TOPICAL_CREAM | Freq: Two times a day (BID) | CUTANEOUS | 1 refills | Status: DC

## 2023-02-04 NOTE — Patient Instructions (Addendum)
-  I do not think you have a hernia but I've attached some information for you to learn about hernias -You have a severe foot fungus. I'd recommend you stop the salt water or vinegar soaks. Use the cream I am prescribing. I am also giving you tablets to reduce itching and help you sleep  -Call to schedule follow up with your FOOT doctor: Yvetta Coder, [address]  [place], [place] [address]  [phone] (Work)   -Come back in about 1 month for a fasting physical

## 2023-02-04 NOTE — Progress Notes (Signed)
Date:  02/04/2023   Name:  [name] [name]   DOB:  [birth year]   MRN:  [id number]   Chief Complaint: New Patient (Initial Visit), Hernia, and Foot Fungus  HPI [name] [name] is a very pleasant 62 year old male with history of hypertension and previous homelessness who presents new to the practice today here to establish care and discuss tinea pedis.  Patient has been struggling with foot fungus for the past 1-2 years, previously evaluated by prior PCP and podiatry Dr. [facility]leda [name] (I have reviewed these notes).  He has run out of antifungal cream, currently soaking feet in white vinegar baths daily.  Complains of itchy, flaky, painful interdigital spaces involving both feet.  He often wakes up in the middle of the night itching and in pain.  History of homelessness and shared bathroom/facilities, but currently living in an apartment and practicing daily foot hygiene with clean socks and shoes.  He says he keeps his shower clean and scrubs the basin with Clorox regularly. The cosmetic appearance of his feet is also troubling to him.   Also mentions suspected left abdominal hernia.  Patient says he thinks he has had a hernia in the upper left abdomen, says sometimes other people have pointed it out to him.  Occasionally tender.  Recent Labs     Component Value Date/Time   NA 135 02/07/2022 0425   K 3.9 02/07/2022 0425   CL 104 02/07/2022 0425   CO2 25 02/07/2022 0425   GLUCOSE 117 (H) 02/07/2022 0425   BUN 19 02/07/2022 0425   CREATININE 1.05 02/07/2022 0425   CALCIUM 8.6 (L) 02/07/2022 0425   PROT 8.0 07/02/2021 1634   ALBUMIN 4.5 07/02/2021 1634   AST 42 (H) 07/02/2021 1634   ALT 33 07/02/2021 1634   ALKPHOS 95 07/02/2021 1634   BILITOT 0.2 (L) 07/02/2021 1634   GFRNONAA >60 02/07/2022 0425   GFRAA >60 09/13/2018 0346    Lab Results  Component Value Date   WBC 8.1 02/08/2022   HGB 10.7 (L) 02/08/2022   HCT 33.5 (L) 02/08/2022   MCV 85.2 02/08/2022   PLT 228 02/08/2022   Lab  Results  Component Value Date   HGBA1C 5.0 09/15/2018   Lab Results  Component Value Date   CHOL 132 09/15/2018   HDL 43 09/15/2018   LDLCALC 79 09/15/2018   TRIG 51 09/15/2018   CHOLHDL 3.1 09/15/2018   Lab Results  Component Value Date   TSH 0.574 09/15/2018    Review of Systems  Gastrointestinal:  Negative for abdominal pain, blood in stool, constipation, diarrhea, nausea and vomiting.  Skin:        "Foot fungus"  All other systems reviewed and are negative.   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   Chest pain 08/05/2018   Ischemic dilated cardiomyopathy ([place]) 08/04/2018   Cocaine abuse ([place]) 04/30/2018   Mild tetrahydrocannabinol (THC) abuse 04/30/2018   Moderate episode of recurrent major depressive disorder ([place]) 04/30/2018   Unstable angina ([place]) 06/13/2016   Benign essential HTN 06/13/2016   Smoking 06/13/2016   Family history of heart attack    Schizoaffective disorder, depressive type ([place]) [id number]   Acute alcoholic intoxication in alcoholism with complication ([place]) [id number]   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[place]) 04/15/2015   Polysubstance dependence ([place]) 04/13/2015    No Known Allergies  Past Surgical History:  Procedure Laterality Date   CHOLECYSTECTOMY N/A 12/14/2014   Procedure: LAPAROSCOPIC CHOLECYSTECTOMY WITH INTRAOPERATIVE CHOLANGIOGRAM;  Surgeon: Erroll Luna, MD;  Location: [place];  Service: General;  Laterality: N/A;   HERNIA REPAIR     KNEE CLOSED REDUCTION Right 07/10/2022   Procedure: CLOSED MANIPULATION KNEE;  Surgeon: Thornton Park, MD;  Location: [facility] ORS;  Service: Orthopedics;  Laterality: Right;   LEFT HEART CATH AND CORONARY ANGIOGRAPHY N/A 08/05/2018   Procedure: LEFT HEART CATH AND CORONARY ANGIOGRAPHY;  Surgeon: Jettie Booze, MD;  Location: [place] [facility];   Service: Cardiovascular;  Laterality: N/A;   TOTAL KNEE ARTHROPLASTY Right 02/06/2022   Procedure: TOTAL KNEE ARTHROPLASTY;  Surgeon: Thornton Park, MD;  Location: [facility] OR[facility];  Service: Orthopedics;  Laterality: Right;    Social History   Tobacco Use   Smoking status: Every Day    Packs/day: 1.00    Years: 35.00    Additional pack years: 0.00    Total pack years: 35.00    Types: Cigarettes   Smokeless tobacco: Never  Vaping Use   Vaping Use: Every day   Start date: 01/10/2022   Substances: Nicotine, Flavoring  Substance Use Topics   Alcohol use: Yes    Alcohol/week: 2.0 standard drinks of alcohol    Types: 2 Cans of beer per week    Comment: occasional   Drug use: Not Currently    Types: Marijuana, Cocaine    Comment: pt states quit 2 years ago     Medication list has been reviewed and updated.  Current Meds  Medication Sig   amLODipine (NORVASC) 10 MG tablet Take 10 mg by mouth daily.   gabapentin (NEURONTIN) 600 MG tablet Take 600 mg by mouth 3 (three) times daily.   hydrOXYzine (VISTARIL) 25 MG capsule Take 1 capsule (25 mg total) by mouth every 8 (eight) hours as needed for itching (take 1 hour before bedtime).   lisinopril (ZESTRIL) 40 MG tablet Take 40 mg by mouth every morning.   terbinafine (LAMISIL) 1 % cream Apply 1 Application topically 2 (two) times daily. Apply to webs of feet twice per day       02/04/2023    2:09 PM  GAD 7 : Generalized Anxiety Score  Nervous, Anxious, on Edge 0  Control/stop worrying 0  Worry too much - different things 0  Trouble relaxing 0  Restless 1  Easily annoyed or irritable 1  Afraid - awful might happen 1  Total GAD 7 Score 3  Anxiety Difficulty Not difficult at all       02/04/2023    2:09 PM  Depression screen PHQ 2/9  Decreased Interest 0  Down, Depressed, Hopeless 0  PHQ - 2 Score 0  Altered sleeping 0  Tired, decreased energy 1  Change in appetite 0  Feeling bad or failure about yourself  1  Trouble  concentrating 0  Moving slowly or fidgety/restless 1  Suicidal thoughts 0  PHQ-9 Score 3  Difficult doing work/chores Not difficult at all    BP Readings from Last 3 Encounters:  02/04/23 124/78  07/10/22 (!) 150/96  02/08/22 138/82    Physical Exam Vitals and nursing note reviewed.  Constitutional:      Appearance: Normal appearance.  Abdominal:     General: Abdomen is flat.     Palpations: Abdomen is soft. There is no mass.     Tenderness: There is no abdominal tenderness.     Hernia: No hernia is present.  Comments: There is a subtle bulge/abdominal asymmetry of the left upper quadrant which I believed to be muscular in nature.  No abdominal hernia appreciated at rest or with Valsalva standing/seated  Feet:     Right foot:     Skin integrity: Skin breakdown, dry skin and fissure present.     Left foot:     Skin integrity: Skin breakdown, dry skin and fissure present.     Comments: Severe tinea pedis of all webspaces on both feet R>L.  Skin maceration and breakdown evident.  No bleeding.  Spreading toes is painful.  Notable skin flaking/debris of interdigital spaces as well.  Mild/minimal onychomycosis Psychiatric:        Mood and Affect: Mood normal.        Behavior: Behavior normal.     Wt Readings from Last 3 Encounters:  02/04/23 183 lb (83 kg)  07/10/22 164 lb (74.4 kg)  02/06/22 170 lb (77.1 kg)    BP 124/78   Pulse 84   Ht 5\' 6"  (1.676 m)   Wt 183 lb (83 kg)   SpO2 98%   BMI 29.54 kg/m   Assessment and Plan:  1. Tinea pedis of both feet Discussed with patient this is a severe case of tinea pedis likely requiring specialist care.  He is already established with [facility] clinic Dr. Cleda [name] and I recommended patient make appointment with that practice for follow-up on this problem.  In the meantime, I will refill terbinafine cream, but discussed that this will probably not be enough to completely treat this condition at this stage.    I am also prescribing  hydroxyzine temporarily to help with itching, particularly at night.  It is my hope that this will allow him a deeper and less disruptive sleep.  We discussed the quality of patient's shower water, which he believes is "hard water"; mentioned the possibility of considering a water softening filter for the showerhead which can be purchased OTC. Keep the shower/tub clean, especially the floor.   - terbinafine (LAMISIL) 1 % cream; Apply 1 Application topically 2 (two) times daily. Apply to webs of feet twice per day  Dispense: 30 g; Refill: 1 - hydrOXYzine (VISTARIL) 25 MG capsule; Take 1 capsule (25 mg total) by mouth every 8 (eight) hours as needed for itching (take 1 hour before bedtime).  Dispense: 30 capsule; Refill: 0   2, Abdominal asymmetry Patient reassured that I do not believe this is a hernia.  Based on my exam, it seems that this is actually muscle, slightly more developed on the left than the right.  Education given on hernias and how they develop, we can monitor for now.  Return in about 1 month (around 03/07/2023) for fasting CPE with labs .   Partially dictated using Editor, commissioning. Any errors are unintentional.  Lupita Leash, PA-C, [facility]

## 2023-02-05 ENCOUNTER — Telehealth: Payer: Self-pay | Admitting: Physician Assistant

## 2023-02-05 DIAGNOSIS — B353 Tinea pedis: Secondary | ICD-10-CM

## 2023-02-05 MED ORDER — CLOTRIMAZOLE 1 % EX CREA: 1.0000 | TOPICAL_CREAM | Freq: Two times a day (BID) | CUTANEOUS | 1 refills | Status: DC

## 2023-02-05 NOTE — Telephone Encounter (Signed)
Tried calling patient. Unable to reach at this time. Called home phone and no answer. Called mobile phone and it stated "please try your call again later."  [name] [name]

## 2023-02-05 NOTE — Telephone Encounter (Signed)
Copied from [place] [id number]. Topic: General - Inquiry >> Feb 05, 2023  8:30 AM Penni Bombard wrote: Reason for CRM: pt called saying the pharmacy told him that his insuracne would not cover the cream for his feet that was prescribed yesterday.  Can something be sent in.  CB#  [phone]

## 2023-02-05 NOTE — Addendum Note (Signed)
Addended byLupita Leash on: 02/05/2023 10:08 AM   Modules accepted: Orders

## 2023-02-25 ENCOUNTER — Telehealth: Payer: Self-pay | Admitting: Physician Assistant

## 2023-02-25 NOTE — Telephone Encounter (Signed)
Copied from CRM [id number]. Topic: Medicare AWV >> Feb 25, 2023  3:33 PM Payton Doughty wrote: Reason for CRM: Called patient to schedule Medicare Annual Wellness Visit (AWV). No voicemail available to leave a message.  Last date of AWV: NONE  Please schedule an appointment at any time with Kennedy Bucker, LPN  .  If any questions, please contact me.  Thank you ,  Verlee Rossetti; Care Guide Ambulatory Clinical Support [place] [facility] Direct Dial: [phone]

## 2023-02-25 NOTE — Telephone Encounter (Signed)
Copied from CRM [id number]. Topic: Medicare AWV >> Feb 25, 2023  3:30 PM Payton Doughty wrote: Reason for CRM: Called patient to schedule Medicare Annual Wellness Visit (AWV). Left message for patient to call back and schedule Medicare Annual Wellness Visit (AWV).  Last date of AWV: NONE  Please schedule an appointment at any time with Kennedy Bucker, LPN  .  If any questions, please contact me.  Thank you ,  Verlee Rossetti; Care Guide Ambulatory Clinical Support [place] [facility] Direct Dial: [phone]

## 2023-02-26 ENCOUNTER — Ambulatory Visit (INDEPENDENT_AMBULATORY_CARE_PROVIDER_SITE_OTHER): Payer: 59 | Admitting: Physician Assistant

## 2023-02-26 ENCOUNTER — Encounter: Payer: Self-pay | Admitting: Physician Assistant

## 2023-02-26 VITALS — BP 122/76 | HR 68 | Temp 97.9°F | Ht 66.0 in | Wt 189.0 lb

## 2023-02-26 DIAGNOSIS — R062 Wheezing: Secondary | ICD-10-CM | POA: Diagnosis not present

## 2023-02-26 DIAGNOSIS — F17200 Nicotine dependence, unspecified, uncomplicated: Secondary | ICD-10-CM | POA: Diagnosis not present

## 2023-02-26 DIAGNOSIS — R0601 Orthopnea: Secondary | ICD-10-CM | POA: Diagnosis not present

## 2023-02-26 DIAGNOSIS — I255 Ischemic cardiomyopathy: Secondary | ICD-10-CM

## 2023-02-26 DIAGNOSIS — I420 Dilated cardiomyopathy: Secondary | ICD-10-CM

## 2023-02-26 DIAGNOSIS — E782 Mixed hyperlipidemia: Secondary | ICD-10-CM

## 2023-02-26 DIAGNOSIS — Z0000 Encounter for general adult medical examination without abnormal findings: Secondary | ICD-10-CM

## 2023-02-26 DIAGNOSIS — F1911 Other psychoactive substance abuse, in remission: Secondary | ICD-10-CM

## 2023-02-26 DIAGNOSIS — E559 Vitamin D deficiency, unspecified: Secondary | ICD-10-CM

## 2023-02-26 DIAGNOSIS — I700 Atherosclerosis of aorta: Secondary | ICD-10-CM

## 2023-02-26 DIAGNOSIS — Z122 Encounter for screening for malignant neoplasm of respiratory organs: Secondary | ICD-10-CM

## 2023-02-26 DIAGNOSIS — B353 Tinea pedis: Secondary | ICD-10-CM

## 2023-02-26 MED ORDER — HYDROXYZINE PAMOATE 25 MG PO CAPS: 25.0000 mg | ORAL_CAPSULE | Freq: Three times a day (TID) | ORAL | 1 refills | Status: AC | PRN

## 2023-02-26 MED ORDER — ALBUTEROL SULFATE HFA 108 (90 BASE) MCG/ACT IN AERS: 2.0000 | INHALATION_SPRAY | Freq: Four times a day (QID) | RESPIRATORY_TRACT | 5 refills | Status: AC | PRN

## 2023-02-26 MED ORDER — ATORVASTATIN CALCIUM 10 MG PO TABS: 10.0000 mg | ORAL_TABLET | Freq: Every day | ORAL | 1 refills | Status: DC
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
## 2023-02-27 DIAGNOSIS — B353 Tinea pedis: Secondary | ICD-10-CM | POA: Insufficient documentation

## 2023-02-27 DIAGNOSIS — R0601 Orthopnea: Secondary | ICD-10-CM | POA: Insufficient documentation

## 2023-02-27 DIAGNOSIS — I700 Atherosclerosis of aorta: Secondary | ICD-10-CM | POA: Insufficient documentation

## 2023-02-27 LAB — VITAMIN D 25 HYDROXY (VIT D DEFICIENCY, FRACTURES): Vit D, 25-Hydroxy: 37.7 ng/mL (ref 30.0–100.0)

## 2023-02-27 NOTE — Progress Notes (Signed)
I don't see a message about imaging for pt.  KP

## 2023-02-28 NOTE — Progress Notes (Signed)
Called pt left message with his sister to have him call the office.  PEC may give results if patient returns call - CRM created.  KP

## 2023-03-04 ENCOUNTER — Emergency Department (HOSPITAL_COMMUNITY): Payer: 59

## 2023-03-04 ENCOUNTER — Other Ambulatory Visit: Payer: Self-pay

## 2023-03-04 ENCOUNTER — Encounter (HOSPITAL_COMMUNITY): Payer: Self-pay | Admitting: Emergency Medicine

## 2023-03-04 DIAGNOSIS — Y92007 Garden or yard of unspecified non-institutional (private) residence as the place of occurrence of the external cause: Secondary | ICD-10-CM | POA: Insufficient documentation

## 2023-03-04 DIAGNOSIS — S2232XA Fracture of one rib, left side, initial encounter for closed fracture: Secondary | ICD-10-CM | POA: Diagnosis not present

## 2023-03-04 DIAGNOSIS — Z79899 Other long term (current) drug therapy: Secondary | ICD-10-CM | POA: Diagnosis not present

## 2023-03-04 DIAGNOSIS — S299XXA Unspecified injury of thorax, initial encounter: Secondary | ICD-10-CM | POA: Diagnosis present
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
## 2023-03-04 NOTE — ED Notes (Signed)
Pt drinking soda while waiting in the lobby.

## 2023-03-05 ENCOUNTER — Emergency Department (HOSPITAL_COMMUNITY)
Admission: EM | Admit: 2023-03-05 | Discharge: 2023-03-05 | Disposition: A | Discharge status: Home / Self Care | Payer: 59 | Attending: Emergency Medicine | Admitting: Emergency Medicine

## 2023-03-05 DIAGNOSIS — S2232XA Fracture of one rib, left side, initial encounter for closed fracture: Secondary | ICD-10-CM

## 2023-03-05 MED ORDER — KETOROLAC TROMETHAMINE 30 MG/ML IJ SOLN
15.0000 mg | Freq: Once | INTRAMUSCULAR | Status: DC
  Filled 2023-03-05: qty 1

## 2023-03-05 MED ORDER — OXYCODONE-ACETAMINOPHEN 5-325 MG PO TABS
1.0000 | ORAL_TABLET | Freq: Once | ORAL | Status: AC
  Administered 2023-03-05: 1 via ORAL
  Filled 2023-03-05: qty 1

## 2023-03-05 MED ORDER — KETOROLAC TROMETHAMINE 30 MG/ML IJ SOLN
15.0000 mg | Freq: Once | INTRAMUSCULAR | Status: AC
  Administered 2023-03-05: 15 mg via INTRAMUSCULAR

## 2023-03-05 MED ORDER — OXYCODONE-ACETAMINOPHEN 5-325 MG PO TABS: 1.0000 | ORAL_TABLET | Freq: Four times a day (QID) | ORAL | 0 refills | Status: DC | PRN
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
## 2023-03-06 ENCOUNTER — Telehealth: Payer: Self-pay

## 2023-03-06 NOTE — Transitions of Care (Post Inpatient/ED Visit) (Signed)
   03/06/2023  Name: [name] [name] MRN: [id number] DOB: 08-30-1961  Today's TOC FU Call Status: Today's TOC FU Call Status:: Unsuccessul Call (1st Attempt) Unsuccessful Call (1st Attempt) Date: 03/06/23  Attempted to reach the patient regarding the most recent Inpatient/ED visit.  Follow Up Plan: Additional outreach attempts will be made to reach the patient to complete the Transitions of Care (Post Inpatient/ED visit) call.   [employer], CMA

## 2023-03-07 NOTE — Transitions of Care (Post Inpatient/ED Visit) (Signed)
   03/07/2023  Name: Lucius Wise MRN: [id number] DOB: 1961-07-15  Today's TOC FU Call Status: Today's TOC FU Call Status:: Unsuccessful Call (2nd Attempt) Unsuccessful Call (1st Attempt) Date: 03/06/23 Unsuccessful Call (2nd Attempt) Date: 03/07/23  Attempted to reach the patient regarding the most recent Inpatient/ED visit.  Follow Up Plan: Additional outreach attempts will be made to reach the patient to complete the Transitions of Care (Post Inpatient/ED visit) call.   [employer], CMA

## 2023-03-08 NOTE — Transitions of Care (Post Inpatient/ED Visit) (Signed)
   03/08/2023  Name: [name] [name] MRN: [id number] DOB: [birth year]  Today's TOC FU Call Status: Today's TOC FU Call Status:: Unsuccessful Call (3rd Attempt) Unsuccessful Call (1st Attempt) Date: 03/06/23 Unsuccessful Call (2nd Attempt) Date: 03/07/23 Unsuccessful Call (3rd Attempt) Date: 03/08/23  Attempted to reach the patient regarding the most recent Inpatient/ED visit.  Follow Up Plan: No further outreach attempts will be made at this time. We have been unable to contact the patient.  [employer], CMA

## 2023-03-28 ENCOUNTER — Ambulatory Visit: Payer: 59

## 2023-04-30 ENCOUNTER — Encounter: Payer: Self-pay | Admitting: Physician Assistant

## 2023-04-30 ENCOUNTER — Ambulatory Visit: Payer: 59 | Admitting: Physician Assistant

## 2023-05-07 ENCOUNTER — Other Ambulatory Visit: Payer: Self-pay

## 2023-05-07 ENCOUNTER — Other Ambulatory Visit: Payer: Self-pay | Admitting: Physician Assistant

## 2023-05-07 DIAGNOSIS — E782 Mixed hyperlipidemia: Secondary | ICD-10-CM

## 2023-05-07 MED ORDER — ATORVASTATIN CALCIUM 20 MG PO TABS: 20.0000 mg | ORAL_TABLET | Freq: Every day | ORAL | 0 refills | Status: AC

## 2023-05-07 MED ORDER — ATORVASTATIN CALCIUM 10 MG PO TABS: 10.0000 mg | ORAL_TABLET | Freq: Every day | ORAL | 0 refills | Status: DC

## 2023-06-12 ENCOUNTER — Ambulatory Visit: Payer: 59

## 2023-06-25 ENCOUNTER — Encounter: Payer: Self-pay | Admitting: *Deleted

## 2023-07-08 ENCOUNTER — Telehealth: Payer: Self-pay | Admitting: Physician Assistant

## 2023-07-08 NOTE — Telephone Encounter (Signed)
Copied from CRM [id number]. Topic: Medicare AWV >> Jul 08, 2023  3:04 PM Payton Doughty wrote: Reason for CRM: Called 07/08/2023 to sched AWV - NO VOICEMAIL  Verlee Rossetti; Care Guide Ambulatory Clinical Support [facility] Direct Dial: [phone]

## 2023-07-25 ENCOUNTER — Emergency Department
Admission: EM | Admit: 2023-07-25 | Discharge: 2023-07-26 | Disposition: A | Discharge status: Home / Self Care | Payer: 59 | Attending: Emergency Medicine | Admitting: Emergency Medicine

## 2023-07-25 ENCOUNTER — Emergency Department: Payer: 59

## 2023-07-25 ENCOUNTER — Other Ambulatory Visit: Payer: Self-pay

## 2023-07-25 DIAGNOSIS — R45851 Suicidal ideations: Secondary | ICD-10-CM | POA: Diagnosis not present

## 2023-07-25 DIAGNOSIS — F10920 Alcohol use, unspecified with intoxication, uncomplicated: Secondary | ICD-10-CM | POA: Diagnosis not present

## 2023-07-25 DIAGNOSIS — F1490 Cocaine use, unspecified, uncomplicated: Secondary | ICD-10-CM | POA: Insufficient documentation

## 2023-07-25 DIAGNOSIS — F29 Unspecified psychosis not due to a substance or known physiological condition: Secondary | ICD-10-CM | POA: Diagnosis not present

## 2023-07-25 DIAGNOSIS — F10929 Alcohol use, unspecified with intoxication, unspecified: Secondary | ICD-10-CM

## 2023-07-25 DIAGNOSIS — Y908 Blood alcohol level of 240 mg/100 ml or more: Secondary | ICD-10-CM | POA: Insufficient documentation

## 2023-07-25 DIAGNOSIS — R569 Unspecified convulsions: Secondary | ICD-10-CM | POA: Insufficient documentation

## 2023-07-25 DIAGNOSIS — F1090 Alcohol use, unspecified, uncomplicated: Secondary | ICD-10-CM | POA: Diagnosis present

## 2023-07-25 LAB — BASIC METABOLIC PANEL
Anion gap: 14 (ref 5–15)
BUN: 19 mg/dL (ref 6–20)
CO2: 27 mmol/L (ref 22–32)
Calcium: 8.9 mg/dL (ref 8.9–10.3)
Chloride: 101 mmol/L (ref 98–111)
Creatinine, Ser: 0.98 mg/dL (ref 0.61–1.24)
GFR, Estimated: >60 mL/min (ref >60)
Glucose, Bld: 93 mg/dL (ref 70–99)
Potassium: 4.1 mmol/L (ref 3.5–5.1)
Sodium: 142 mmol/L (ref 135–145)

## 2023-07-25 LAB — CBC WITH DIFFERENTIAL/PLATELET
Abs Immature Granulocytes: 0.01 10*3/uL (ref 0.00–0.07)
Basophils Absolute: 0 10*3/uL (ref 0.0–0.1)
Basophils Relative: 1 %
Eosinophils Absolute: 0 10*3/uL (ref 0.0–0.5)
Eosinophils Relative: 1 %
HCT: 46.7 % (ref 39.0–52.0)
Hemoglobin: 15.4 g/dL (ref 13.0–17.0)
Immature Granulocytes: 0 %
Lymphocytes Relative: 47 %
Lymphs Abs: 2.6 10*3/uL (ref 0.7–4.0)
MCH: 27.7 pg (ref 26.0–34.0)
MCHC: 33 g/dL (ref 30.0–36.0)
MCV: 84.1 fL (ref 80.0–100.0)
Monocytes Absolute: 0.3 10*3/uL (ref 0.1–1.0)
Monocytes Relative: 5 %
Neutro Abs: 2.5 10*3/uL (ref 1.7–7.7)
Neutrophils Relative %: 46 %
Platelets: 385 10*3/uL (ref 150–400)
RBC: 5.55 MIL/uL (ref 4.22–5.81)
RDW: 14.6 % (ref 11.5–15.5)
WBC: 5.4 10*3/uL (ref 4.0–10.5)
nRBC: 0 % (ref 0.0–0.2)

## 2023-07-25 LAB — URINE DRUG SCREEN, QUALITATIVE (ARMC ONLY)
Amphetamines, Ur Screen: NOT DETECTED
Barbiturates, Ur Screen: NOT DETECTED
Benzodiazepine, Ur Scrn: NOT DETECTED
Cannabinoid 50 Ng, Ur ~~LOC~~: NOT DETECTED
Cocaine Metabolite,Ur ~~LOC~~: NOT DETECTED
MDMA (Ecstasy)Ur Screen: NOT DETECTED
Methadone Scn, Ur: NOT DETECTED
Opiate, Ur Screen: NOT DETECTED
Phencyclidine (PCP) Ur S: NOT DETECTED
Tricyclic, Ur Screen: NOT DETECTED

## 2023-07-25 LAB — ETHANOL: Alcohol, Ethyl (B): 307 mg/dL (ref <10)

## 2023-07-25 MED ORDER — NICOTINE 21 MG/24HR TD PT24
21.0000 mg | MEDICATED_PATCH | Freq: Once | TRANSDERMAL | Status: AC
  Administered 2023-07-25: 21 mg via TRANSDERMAL
  Filled 2023-07-25: qty 1

## 2023-07-25 MED ORDER — HALOPERIDOL LACTATE 5 MG/ML IJ SOLN
5.0000 mg | Freq: Once | INTRAMUSCULAR | Status: AC
  Administered 2023-07-25: 5 mg via INTRAVENOUS
  Filled 2023-07-25: qty 1

## 2023-07-25 MED ORDER — LORAZEPAM 2 MG/ML IJ SOLN
2.0000 mg | Freq: Once | INTRAMUSCULAR | Status: AC
  Administered 2023-07-25: 2 mg via INTRAVENOUS
  Filled 2023-07-25: qty 1

## 2023-07-25 MED ORDER — DIPHENHYDRAMINE HCL 50 MG/ML IJ SOLN
50.0000 mg | Freq: Once | INTRAMUSCULAR | Status: AC
  Administered 2023-07-25: 50 mg via INTRAVENOUS
  Filled 2023-07-25: qty 1

## 2023-07-25 NOTE — ED Notes (Signed)
PT  PLACED UNDER  IVC PAPERS  PER  DR  Derrill Kay  MD

## 2023-07-25 NOTE — BH Assessment (Signed)
Per staff nurse, patient is unable to participate in a psych assessment at this time.

## 2023-07-25 NOTE — ED Notes (Signed)
Pt dressed out in purple scrubs and belonging removed and placed at nurse's station. Pt calm and cooperative at this time. Pt making garbled speech and incomprehensible by this RN at this time however following commands.

## 2023-07-25 NOTE — ED Triage Notes (Signed)
See triage note.

## 2023-07-25 NOTE — ED Notes (Signed)
Pt heard falling in room. Pt found to be on back in floor. Abrasion noted to lower back/buttocks region.  Assessed by this RN. Pt denies pain when asked. Derrill Kay MD made aware of fall. No further orders given. Pt assisted back in bed. Call bell remains at stretcher. Bed alarm set. Door open. Urinal remains at bedside for urinary needs.

## 2023-07-25 NOTE — ED Notes (Signed)
Patient refused consent to be evaluated by registration and stating he wants to leave and not be seen.

## 2023-07-25 NOTE — ED Triage Notes (Signed)
First nurse note: Pt here via [facility]. Pt here for possible seizure, per EMS does take seizure meds. EMS states post-itcal period on their arrival.   Pt does admit he did cocaine and drank ETOH today.    CBG 142 HR:   149/96

## 2023-07-25 NOTE — ED Provider Notes (Signed)
[facility] Provider Note    Event Date/Time   First MD Initiated Contact with Patient 07/25/23 1703     (approximate)   History   Seizures   HPI  [name] [name] is a 62 y.o. male who presents to the emergency department today after concerns for seizure-like activity.  Unfortunately patient is intoxicated so cannot give great history.  Apparently though he did have a seizure-like episode.  He denies any history of seizures and family at bedside denies any knowledge of seizures.  Patient does have admit to using alcohol and cocaine today.  Additionally patient states that he felt suicidal earlier today.     Physical Exam   Triage Vital Signs: ED Triage Vitals  Encounter Vitals Group     BP 07/25/23 1654 (!) 164/85     Systolic BP Percentile --      Diastolic BP Percentile --      Pulse Rate 07/25/23 1654 92     Resp 07/25/23 1654 18     Temp 07/25/23 1657 97.8 F (36.6 C)     Temp Source 07/25/23 1657 Oral     SpO2 07/25/23 1654 100 %     Weight 07/25/23 1656 140 lb (63.5 kg)     Height 07/25/23 1656 5\' 6"  (1.676 m)     Head Circumference --      Peak Flow --      Pain Score 07/25/23 1656 0     Pain Loc --      Pain Education --      Exclude from Growth Chart --     Most recent vital signs: Vitals:   07/25/23 1654 07/25/23 1657  BP: (!) 164/85   Pulse: 92   Resp: 18   Temp:  97.8 F (36.6 C)  SpO2: 100%    General: Awake, alert, oriented. CV:  Good peripheral perfusion. Regular rate and rhythm. Resp:  Normal effort. Lungs clear. Abd:  No distention.    ED Results / Procedures / Treatments   Labs (all labs ordered are listed, but only abnormal results are displayed) Labs Reviewed  ETHANOL - Abnormal; Notable for the following components:      Result Value   Alcohol, Ethyl (B) 307 (*)    All other components within normal limits  CBC WITH DIFFERENTIAL/PLATELET  BASIC METABOLIC PANEL  URINE DRUG SCREEN, QUALITATIVE ([facility] ONLY)      EKG  None   RADIOLOGY I independently interpreted and visualized the CT head. My interpretation: No bleed Radiology interpretation:  IMPRESSION:  1. No acute intracranial abnormality.  2. Chronic small vessel disease.     PROCEDURES:  Critical Care performed: No   MEDICATIONS ORDERED IN ED: Medications - No data to display   IMPRESSION / MDM / ASSESSMENT AND PLAN / ED COURSE  I reviewed the triage vital signs and the nursing notes.                              Differential diagnosis includes, but is not limited to, SI, drug induced mood disorder, intoxication, intracranial process  Patient's presentation is most consistent with acute presentation with potential threat to life or bodily function.   Patient presents to the emergency department today because of concerns for seizure-like episode.  The time my exam patient is awake and alert however appears intoxicated.  Did obtain a CT scan of the head which did not show any concerning  intracranial process.  Blood work shows elevated ethanol level but no concerning electrolyte abnormality.  At this time unclear if patient truly had a seizure.  Given that patient stated he felt suicidal earlier today patient was placed under IVC.  Will have psychiatry evaluate.      FINAL CLINICAL IMPRESSION(S) / ED DIAGNOSES   Final diagnoses:  Seizure-like activity (HCC)  Alcoholic intoxication with complication (HCC)  Suicidal ideation     Note:  This document was prepared using Dragon voice recognition software and may include unintentional dictation errors.    Phineas Semen, MD 07/26/23 0005

## 2023-07-26 ENCOUNTER — Telehealth: Payer: Self-pay

## 2023-07-26 DIAGNOSIS — F10920 Alcohol use, unspecified with intoxication, uncomplicated: Secondary | ICD-10-CM | POA: Diagnosis not present

## 2023-07-26 MED ORDER — GABAPENTIN 300 MG PO CAPS
600.0000 mg | ORAL_CAPSULE | Freq: Three times a day (TID) | ORAL | Status: DC
  Administered 2023-07-26: 600 mg via ORAL
  Filled 2023-07-26: qty 2

## 2023-07-26 MED ORDER — AMLODIPINE BESYLATE 5 MG PO TABS
10.0000 mg | ORAL_TABLET | Freq: Every day | ORAL | Status: DC
  Administered 2023-07-26: 10 mg via ORAL
  Filled 2023-07-26: qty 2

## 2023-07-26 MED ORDER — LISINOPRIL 10 MG PO TABS
40.0000 mg | ORAL_TABLET | ORAL | Status: DC
  Administered 2023-07-26: 40 mg via ORAL
  Filled 2023-07-26: qty 4

## 2023-07-26 MED ORDER — ONDANSETRON 4 MG PO TBDP
4.0000 mg | ORAL_TABLET | Freq: Once | ORAL | Status: AC
  Administered 2023-07-26: 4 mg via ORAL
  Filled 2023-07-26: qty 1

## 2023-07-26 MED ORDER — ATORVASTATIN CALCIUM 20 MG PO TABS
20.0000 mg | ORAL_TABLET | Freq: Every day | ORAL | Status: DC
  Administered 2023-07-26: 20 mg via ORAL
  Filled 2023-07-26: qty 1

## 2023-07-26 MED ORDER — HYDROXYZINE HCL 10 MG PO TABS: 25.0000 mg | ORAL_TABLET | Freq: Three times a day (TID) | ORAL | Status: DC | PRN

## 2023-07-26 NOTE — ED Notes (Signed)
Hospital meal provided.  100% consumed, pt tolerated w/o complaints.  Waste discarded appropriately.   

## 2023-07-26 NOTE — ED Notes (Addendum)
Awaiting ride in lobby. Returned belongings. Provided lunch to patient.

## 2023-07-26 NOTE — ED Provider Notes (Signed)
Emergency Medicine Observation Re-evaluation Note  [name] [name] is a 62 y.o. male, seen on rounds today.  Pt initially presented to the ED for complaints of Seizures  Currently, the patient is is no acute distress. Denies any concerns at this time.  Physical Exam  Blood pressure 116/82, pulse 82, temperature 98.3 F (36.8 C), temperature source Oral, resp. rate 18, height 5\' 6"  (1.676 m), weight 63.5 kg, SpO2 95%.  Physical Exam: General: No apparent distress Pulm: Normal WOB Neuro: Moving all extremities Psych: Resting comfortably     ED Course / MDM     I have reviewed the labs performed to date as well as medications administered while in observation.  Recent changes in the last 24 hours include: No acute events overnight.  Plan   Current plan: Patient evaluated by psychiatry.  Recommended discharge home.  Cleared by psychiatry.  Patient medically cleared.  Safe for discharge home. Patient is no longer under full IVC at this time.    Corena Herter, MD 07/26/23 1308

## 2023-07-26 NOTE — ED Notes (Signed)
ivc/pending consult/pt unable to participate in psych assessment at this time.

## 2023-07-26 NOTE — ED Provider Notes (Incomplete)
[facility] Provider Note    Event Date/Time   First MD Initiated Contact with Patient 07/25/23 1703     (approximate)   History   Seizures   HPI  [name] [name] is a 62 y.o. male who presents to the emergency department today after concerns for seizure-like activity.  Unfortunately patient is intoxicated so cannot give great history.  Apparently though he did have a seizure-like episode.  He denies any history of seizures and family at bedside denies any knowledge of seizures.  Patient does have admit to using alcohol and cocaine today.  Additionally patient states that he felt suicidal earlier today.     Physical Exam   Triage Vital Signs: ED Triage Vitals  Encounter Vitals Group     BP 07/25/23 1654 (!) 164/85     Systolic BP Percentile --      Diastolic BP Percentile --      Pulse Rate 07/25/23 1654 92     Resp 07/25/23 1654 18     Temp 07/25/23 1657 97.8 F (36.6 C)     Temp Source 07/25/23 1657 Oral     SpO2 07/25/23 1654 100 %     Weight 07/25/23 1656 140 lb (63.5 kg)     Height 07/25/23 1656 5\' 6"  (1.676 m)     Head Circumference --      Peak Flow --      Pain Score 07/25/23 1656 0     Pain Loc --      Pain Education --      Exclude from Growth Chart --     Most recent vital signs: Vitals:   07/25/23 1654 07/25/23 1657  BP: (!) 164/85   Pulse: 92   Resp: 18   Temp:  97.8 F (36.6 C)  SpO2: 100%    General: Awake, alert, oriented. CV:  Good peripheral perfusion. Regular rate and rhythm. Resp:  Normal effort. Lungs clear. Abd:  No distention.    ED Results / Procedures / Treatments   Labs (all labs ordered are listed, but only abnormal results are displayed) Labs Reviewed  ETHANOL - Abnormal; Notable for the following components:      Result Value   Alcohol, Ethyl (B) 307 (*)    All other components within normal limits  CBC WITH DIFFERENTIAL/PLATELET  BASIC METABOLIC PANEL  URINE DRUG SCREEN, QUALITATIVE ([facility] ONLY)      EKG  None   RADIOLOGY I independently interpreted and visualized the CT head. My interpretation: *** Radiology interpretation:  IMPRESSION:  1. No acute intracranial abnormality.  2. Chronic small vessel disease.     PROCEDURES:  Critical Care performed: Yes  CRITICAL CARE Performed by: Phineas Semen   Total critical care time: *** minutes  Critical care time was exclusive of separately billable procedures and treating other patients.  Critical care was necessary to treat or prevent imminent or life-threatening deterioration.  Critical care was time spent personally by me on the following activities: development of treatment plan with patient and/or surrogate as well as nursing, discussions with consultants, evaluation of patient's response to treatment, examination of patient, obtaining history from patient or surrogate, ordering and performing treatments and interventions, ordering and review of laboratory studies, ordering and review of radiographic studies, pulse oximetry and re-evaluation of patient's condition.   Procedures    MEDICATIONS ORDERED IN ED: Medications - No data to display   IMPRESSION / MDM / ASSESSMENT AND PLAN / ED COURSE  I reviewed  the triage vital signs and the nursing notes.                              Differential diagnosis includes, but is not limited to, ***  Patient's presentation is most consistent with {EM COPA:27473}   ***The patient is on the cardiac monitor to evaluate for evidence of arrhythmia and/or significant heart rate changes.  ***      FINAL CLINICAL IMPRESSION(S) / ED DIAGNOSES   Final diagnoses:  None     Rx / DC Orders   ED Discharge Orders     None        Note:  This document was prepared using Dragon voice recognition software and may include unintentional dictation errors.

## 2023-07-26 NOTE — BH Assessment (Signed)
Comprehensive Clinical Assessment (CCA) Screening, Triage and Referral Note  07/26/2023 [name] [name] [id number]  Chief Complaint:  Chief Complaint  Patient presents with   Seizures   Visit Diagnosis: Alcohol Use Disorder  [name] [name] "[name]" is a 62 year old male who presents to the ER via EMS. Per the patient, his nephew thought he had a stroke because he found him "passed out" at his home. Patient further explained, he and his nephew were drinking alcohol and he drank too much. He reports of drinking two to three beers a day. However, this time he was drinking liquor. Throughout the interview the patient was calm, cooperative and pleasant. He was able to provide appropriate answers to the questions. He denied SI/HI and AV/H. Patient was focused on discharging so he can go home and make nothing from his home is stolen or get missing.  He denies the use of any other mind-altering substance, besides alcohol and his UDS reflects the same. Per the patient's daughter, she has no concerns for the patient hurting his self or anyone else. She was afraid the patient had a stroke or something else medical had happened.  Patient Reported Information How did you hear about [place]? Family/Friend  What Is the Reason for Your Visit/Call Today? Patient states his nephew thought he had a stroke because he found him passed out.  How [place] Has This Been Causing You Problems? <Week  What Do You Feel Would Help You the Most Today? Alcohol or Drug Use Treatment   Have You Recently Had Any Thoughts About Hurting Yourself? No  Are You Planning to Commit Suicide/Harm Yourself At This time? No   Have you Recently Had Thoughts About Hurting Someone Karolee Ohs? No  Are You Planning to Harm Someone at This Time? No  Explanation: No data recorded  Have You Used Any Alcohol or Drugs in the Past 24 Hours? Yes  How [place] Ago Did You Use Drugs or Alcohol? No data recorded What Did You Use and How Much? Alcohol   Do You  Currently Have a Therapist/Psychiatrist? No  Name of Therapist/Psychiatrist: No data recorded  Have You Been Recently Discharged From Any Office Practice or Programs? No  Explanation of Discharge From Practice/Program: No data recorded   CCA Screening Triage Referral Assessment Type of Contact: Face-to-Face  Telemedicine Service Delivery:   Is this Initial or Reassessment?   Date Telepsych consult ordered in [facility]:    Time Telepsych consult ordered in [facility]:    Location of Assessment: [facility] ED  Provider Location: [facility] ED    Collateral Involvement: No data recorded  Does Patient Have a Court Appointed Legal Guardian? No data recorded Name and Contact of Legal Guardian: No data recorded If Minor and Not Living with Parent(s), Who has Custody? No data recorded Is CPS involved or ever been involved? Never  Is APS involved or ever been involved? Never   Patient Determined To Be At Risk for Harm To Self or Others Based on Review of Patient Reported Information or Presenting Complaint? No  Method: No data recorded Availability of Means: No data recorded Intent: No data recorded Notification Required: No data recorded Additional Information for Danger to Others Potential: No data recorded Additional Comments for Danger to Others Potential: No data recorded Are There Guns or Other Weapons in Your Home? No  Types of Guns/Weapons: No data recorded Are These Weapons Safely Secured?  No data recorded Who Could Verify You Are Able To Have These Secured: No data recorded Do You Have any Outstanding Charges, Pending Court Dates, Parole/Probation? No data recorded Contacted To Inform of Risk of Harm To Self or Others: No data recorded  Does Patient Present under Involuntary Commitment? Yes   [place] of Residence: [place]   Patient Currently Receiving the Following Services: Not Receiving Services   Determination of Need: Emergent (2 hours)   Options For  Referral: ED Visit   Discharge Disposition:    Lilyan Gilford MS, LCAS, [facility], [facility] Therapeutic Triage Specialist 07/26/2023 3:21 PM

## 2023-07-26 NOTE — Consult Note (Signed)
  Patient in the shower. Consult unable to be completed at this time.

## 2023-07-26 NOTE — Transitions of Care (Post Inpatient/ED Visit) (Unsigned)
07/26/2023  Name: Garrick Solomon MRN: [id number] DOB: 08/08/1961  Today's TOC FU Call Status: Today's TOC FU Call Status:: Unsuccessful Call (1st Attempt) Unsuccessful Call (1st Attempt) Date: 07/26/23  Attempted to reach the patient regarding the most recent Inpatient/ED visit.  Follow Up Plan: Additional outreach attempts will be made to reach the patient to complete the Transitions of Care (Post Inpatient/ED visit) call.   [employer], CMA

## 2023-07-29 NOTE — Transitions of Care (Post Inpatient/ED Visit) (Unsigned)
07/29/2023  Name: [name] [name] MRN: [id number] DOB: 1961-10-26  Today's TOC FU Call Status: Today's TOC FU Call Status:: Unsuccessful Call (2nd Attempt) Unsuccessful Call (1st Attempt) Date: 07/26/23 Unsuccessful Call (2nd Attempt) Date: 07/29/23  Attempted to reach the patient regarding the most recent Inpatient/ED visit.  Follow Up Plan: Additional outreach attempts will be made to reach the patient to complete the Transitions of Care (Post Inpatient/ED visit) call.   [employer], CMA

## 2023-07-30 NOTE — Transitions of Care (Post Inpatient/ED Visit) (Signed)
07/30/2023  Name: [name] [name] MRN: [id number] DOB: 1961/08/18  Today's TOC FU Call Status: Today's TOC FU Call Status:: Unsuccessful Call (3rd Attempt) Unsuccessful Call (1st Attempt) Date: 07/26/23 Unsuccessful Call (2nd Attempt) Date: 07/29/23 Unsuccessful Call (3rd Attempt) Date: 07/30/23  Attempted to reach the patient regarding the most recent Inpatient/ED visit.  Follow Up Plan: No further outreach attempts will be made at this time. We have been unable to contact the patient.  [employer], CMA

## 2023-08-12 ENCOUNTER — Telehealth: Payer: Self-pay

## 2023-08-12 NOTE — Telephone Encounter (Signed)
Received a medical release for another provider [facility].  KP

## 2024-02-04 ENCOUNTER — Encounter: Payer: Self-pay | Admitting: *Deleted

## 2024-08-05 ENCOUNTER — Other Ambulatory Visit: Payer: Self-pay

## 2024-08-05 ENCOUNTER — Emergency Department (HOSPITAL_COMMUNITY)
Admission: EM | Admit: 2024-08-05 | Discharge: 2024-08-05 | Disposition: A | Discharge status: Home / Self Care | Attending: Emergency Medicine | Admitting: Emergency Medicine

## 2024-08-05 ENCOUNTER — Emergency Department (HOSPITAL_COMMUNITY)

## 2024-08-05 DIAGNOSIS — R0600 Dyspnea, unspecified: Secondary | ICD-10-CM

## 2024-08-05 DIAGNOSIS — R062 Wheezing: Secondary | ICD-10-CM | POA: Insufficient documentation

## 2024-08-05 DIAGNOSIS — R0789 Other chest pain: Secondary | ICD-10-CM | POA: Insufficient documentation

## 2024-08-05 DIAGNOSIS — R0602 Shortness of breath: Secondary | ICD-10-CM | POA: Diagnosis present

## 2024-08-05 DIAGNOSIS — R059 Cough, unspecified: Secondary | ICD-10-CM | POA: Insufficient documentation

## 2024-08-05 LAB — TROPONIN I (HIGH SENSITIVITY): Troponin I (High Sensitivity): 10 ng/L (ref <18)

## 2024-08-05 LAB — HEPATIC FUNCTION PANEL
ALT: 15 U/L (ref 0–44)
AST: 19 U/L (ref 15–41)
Albumin: 3.9 g/dL (ref 3.5–5.0)
Alkaline Phosphatase: 83 U/L (ref 38–126)
Bilirubin, Direct: <0.1 mg/dL (ref 0.0–0.2)
Total Bilirubin: 0.5 mg/dL (ref 0.0–1.2)
Total Protein: 7.3 g/dL (ref 6.5–8.1)

## 2024-08-05 LAB — BASIC METABOLIC PANEL WITH GFR
Anion gap: 12 (ref 5–15)
BUN: 19 mg/dL (ref 8–23)
CO2: 23 mmol/L (ref 22–32)
Calcium: 9.2 mg/dL (ref 8.9–10.3)
Chloride: 102 mmol/L (ref 98–111)
Creatinine, Ser: 1.02 mg/dL (ref 0.61–1.24)
GFR, Estimated: >60 mL/min (ref >60)
Glucose, Bld: 79 mg/dL (ref 70–99)
Potassium: 4.3 mmol/L (ref 3.5–5.1)
Sodium: 137 mmol/L (ref 135–145)

## 2024-08-05 LAB — CBC
HCT: 45 % (ref 39.0–52.0)
Hemoglobin: 14.5 g/dL (ref 13.0–17.0)
MCH: 27.4 pg (ref 26.0–34.0)
MCHC: 32.2 g/dL (ref 30.0–36.0)
MCV: 84.9 fL (ref 80.0–100.0)
Platelets: 354 K/uL (ref 150–400)
RBC: 5.3 MIL/uL (ref 4.22–5.81)
RDW: 15.1 % (ref 11.5–15.5)
WBC: 5.2 K/uL (ref 4.0–10.5)
nRBC: 0 % (ref 0.0–0.2)

## 2024-08-05 LAB — BRAIN NATRIURETIC PEPTIDE: B Natriuretic Peptide: 43 pg/mL (ref 0.0–100.0)

## 2024-08-05 MED ORDER — PREDNISONE 10 MG PO TABS: 40.0000 mg | ORAL_TABLET | Freq: Every day | ORAL | 0 refills | Status: AC

## 2024-08-05 MED ORDER — IPRATROPIUM-ALBUTEROL 0.5-2.5 (3) MG/3ML IN SOLN
3.0000 mL | Freq: Once | RESPIRATORY_TRACT | Status: AC
  Administered 2024-08-05: 3 mL via RESPIRATORY_TRACT
  Filled 2024-08-05: qty 3

## 2024-08-05 MED ORDER — IPRATROPIUM-ALBUTEROL 0.5-2.5 (3) MG/3ML IN SOLN
3.0000 mL | Freq: Once | RESPIRATORY_TRACT | Status: AC
  Administered 2024-08-05: 3 mL via RESPIRATORY_TRACT

## 2024-08-05 MED ORDER — IPRATROPIUM-ALBUTEROL 0.5-2.5 (3) MG/3ML IN SOLN
RESPIRATORY_TRACT | Status: AC
  Filled 2024-08-05: qty 3

## 2024-08-05 MED ORDER — METHYLPREDNISOLONE SODIUM SUCC 125 MG IJ SOLR
125.0000 mg | Freq: Once | INTRAMUSCULAR | Status: AC
  Administered 2024-08-05: 125 mg via INTRAVENOUS
  Filled 2024-08-05: qty 2

## 2024-08-05 NOTE — ED Triage Notes (Addendum)
 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 Endorses seeing dots as if he'd pass out. Satting 97% RA. Also states anxiety can bring on SOB episodes. Pt Alert and oriented, and ambulatory. Also endorses chest tightness

## 2024-08-05 NOTE — Discharge Instructions (Signed)
 Please follow-up closely with your primary care doctor on an outpatient basis.  Please use your inhaler as directed.  Start taking the prednisone  tomorrow.  Return to emergency department immediately for any new or worsening symptoms.

## 2024-08-05 NOTE — ED Provider Notes (Signed)
 [place] [facility] AT [facility] Provider Note   CSN: [id number] Arrival date & time: 08/05/24  1943     Patient presents with: Shortness of Breath   [name] [name] is a 63 y.o. male.   Patient is a 63 year old male with a past medical history of cardiomyopathy who presents to the emergency department with a chief complaint of chest tightness, shortness of breath which has been ongoing for the past few days.  He notes that his symptoms are worse at night when he attempts to lie down to sleep.  He notes that he has had a cough that has been productive.  He admits to associated wheezing.  He denies edema to lower extremities and has no associated hemoptysis.  He has had no associated palpitations.  He denies any dizziness, lightheadedness or syncope.  He has had no abdominal pain, nausea, vomiting, diarrhea.   Shortness of Breath      Prior to Admission medications   Medication Sig Start Date End Date Taking? Authorizing Provider  albuterol  (VENTOLIN  HFA) 108 (90 Base) MCG/ACT inhaler Inhale 2 puffs into the lungs every 6 (six) hours as needed for wheezing or shortness of breath. 02/26/23   Manya Toribio SQUIBB, PA  amLODipine  (NORVASC ) 10 MG tablet Take 10 mg by mouth daily.    [provider]  atorvastatin  (LIPITOR) 20 MG tablet Take 1 tablet (20 mg total) by mouth daily. 05/07/23   Manya Toribio SQUIBB, PA  DIALYVITE VITAMIN D3 MAX 1.25 MG (50000 UT) TABS Take 1 capsule by mouth once a week. 04/17/23   [provider]  gabapentin  (NEURONTIN ) 600 MG tablet Take 600 mg by mouth 3 (three) times daily. 12/18/21   [provider]  hydrOXYzine  (VISTARIL ) 25 MG capsule Take 1 capsule (25 mg total) by mouth every 8 (eight) hours as needed for itching (take 1 hour before bedtime). 02/26/23   Manya Toribio SQUIBB, PA  lisinopril  (ZESTRIL ) 40 MG tablet Take 40 mg by mouth every morning. 12/18/21   [provider]    Allergies: Patient has no known  allergies.    Review of Systems  Respiratory:  Positive for chest tightness and shortness of breath.   All other systems reviewed and are negative.   Updated Vital Signs BP (!) 167/96   Pulse 82   Temp 98.3 F (36.8 C) (Oral)   Resp 18   Ht 5' 6 (1.676 m)   Wt 78.9 kg   SpO2 97%   BMI 28.08 kg/m   Physical Exam Vitals and nursing note reviewed.  Constitutional:      General: He is not in acute distress.    Appearance: Normal appearance. He is not ill-appearing.  HENT:     Head: Normocephalic and atraumatic.     Nose: Nose normal.     Mouth/Throat:     Mouth: Mucous membranes are moist.  Eyes:     Extraocular Movements: Extraocular movements intact.     Conjunctiva/sclera: Conjunctivae normal.     Pupils: Pupils are equal, round, and reactive to light.  Cardiovascular:     Rate and Rhythm: Normal rate and regular rhythm.     Pulses: Normal pulses.     Heart sounds: Normal heart sounds. No murmur heard.    No gallop.  Pulmonary:     Effort: Pulmonary effort is normal. No tachypnea.     Breath sounds: Wheezing present. No decreased breath sounds, rhonchi or rales.  Chest:     Chest wall:  No tenderness.  Abdominal:     General: Abdomen is flat. Bowel sounds are normal.     Palpations: Abdomen is soft. There is no mass.     Tenderness: There is no abdominal tenderness. There is no guarding.  Musculoskeletal:        General: Normal range of motion.     Cervical back: Normal range of motion and neck supple.     Right lower leg: No edema.     Left lower leg: No edema.  Skin:    General: Skin is warm and dry.     Findings: No ecchymosis or rash.  Neurological:     General: No focal deficit present.     Mental Status: He is alert and oriented to person, place, and time. Mental status is at baseline.  Psychiatric:        Mood and Affect: Mood normal.        Behavior: Behavior normal.        Thought Content: Thought content normal.        Judgment: Judgment normal.      (all labs ordered are listed, but only abnormal results are displayed) Labs Reviewed  BASIC METABOLIC PANEL WITH GFR  CBC  BRAIN NATRIURETIC PEPTIDE  HEPATIC FUNCTION PANEL  TROPONIN I (HIGH SENSITIVITY)    EKG: EKG Interpretation Date/Time:  Wednesday August 05 2024 20:14:45 EDT Ventricular Rate:  79 PR Interval:  162 QRS Duration:  94 QT Interval:  399 QTC Calculation: 458 R Axis:   72  Text Interpretation: Sinus rhythm Biatrial enlargement Left ventricular hypertrophy ST elevation, similar to prior Confirmed by Franklyn Gills [phone]) on 08/05/2024 9:58:51 PM  Radiology: [facility] Chest 2 View Result Date: 08/05/2024 CLINICAL DATA:  Shortness of breath EXAM: CHEST - 2 VIEW COMPARISON:  Chest x-ray 03/04/2023 FINDINGS: The heart size and mediastinal contours are within normal limits. Both lungs are clear. The visualized skeletal structures are unremarkable. IMPRESSION: No active cardiopulmonary disease. Electronically Signed   By: Greig Pique M.D.   On: 08/05/2024 20:17     Procedures   Medications Ordered in the ED  ipratropium-albuterol  (DUONEB) 0.5-2.5 (3) MG/3ML nebulizer solution 3 mL (3 mLs Nebulization Given 08/05/24 2053)  ipratropium-albuterol  (DUONEB) 0.5-2.5 (3) MG/3ML nebulizer solution 3 mL (3 mLs Nebulization Given 08/05/24 2033)  methylPREDNISolone  sodium succinate (SOLU-MEDROL ) 125 mg/2 mL injection 125 mg (125 mg Intravenous Given 08/05/24 2032)                                    Medical Decision Making Amount and/or Complexity of Data Reviewed Labs: ordered. Radiology: ordered.  Risk Prescription drug management.   This patient presents to the ED for concern of dyspnea differential diagnosis includes ACS, pulmonary embolus, pericarditis, myocarditis, endocarditis, aortic aneurysm or dissection, pneumonia, pneumothorax, hemothorax, COPD, asthma, CHF    Additional history obtained:  Additional history obtained from medical records External  records from outside source obtained and reviewed including medical records   Lab Tests:  I Ordered, and personally interpreted labs.  The pertinent results include: No leukocytosis, no anemia, normal kidney function liver function, normal electrolytes, negative troponin, negative BNP   Imaging Studies ordered:  I ordered imaging studies including chest x-ray I independently visualized and interpreted imaging which showed no acute cardiopulmonary process I agree with the radiologist interpretation   Medicines ordered and prescription drug management:  I ordered medication including DuoNeb, Solu-Medrol  for wheezing  and shortness of breath Reevaluation of the patient after these medicines showed that the patient improved I have reviewed the patients home medicines and have made adjustments as needed   Problem List / ED Course:  Patient symptoms have greatly improved with treatment in the emergency department and he is stable for discharge home.  Suspect the patient may have some underlying degree of COPD that he is not aware of as he is still an active smoker.  He was wheezing on exam and this has improved with treatment in the emergency department.  Will continue outpatient treatment with his inhaler as well as oral steroids.  Vital signs are stable with no indication for sepsis and no associated hypoxia.  Patient has an EKG with no acute ischemic changes and is consistent with previous.  He has a known history of LVH.  He has a negative troponin at this point.  Do not suspect that serial troponins are warranted as symptoms have been ongoing for greater than 24 hours.  Patient denies any active chest pain at this point.  Low suspicion for pulmonary embolus in this patient and I do not suspect aortic aneurysm or dissection.  Chest x-ray demonstrated no indication for pneumonia, pneumothorax, hemothorax.  Low suspicion for pericarditis or myocarditis.  The need for close follow-up with primary  care doctor on outpatient basis was discussed as well as strict turn precautions for any new or worsening symptoms.  Patient voiced understanding and had no additional questions. Patient was fully evaluated by attending physician who is in agreement to plan at this time.    Social Determinants of Health:  None        Final diagnoses:  None    ED Discharge Orders     None          Daralene Lonni JONETTA DEVONNA 08/05/24 2326    Franklyn Sid SAILOR, MD 08/06/24 2351

## 2024-10-20 ENCOUNTER — Other Ambulatory Visit: Payer: Self-pay

## 2024-10-20 ENCOUNTER — Emergency Department
Admission: EM | Admit: 2024-10-20 | Discharge: 2024-10-21 | Disposition: A | Attending: Emergency Medicine | Admitting: Emergency Medicine

## 2024-10-20 DIAGNOSIS — F32A Depression, unspecified: Secondary | ICD-10-CM

## 2024-10-20 DIAGNOSIS — F39 Unspecified mood [affective] disorder: Secondary | ICD-10-CM

## 2024-10-20 NOTE — ED Notes (Signed)
 This nt and rn jasmine dressed this pt out. Pt's belongings 1 pair of brown crocs 1 pair of black pants 1 cell phone 1 brown t shirt 1 red jacket Some keys in the pocket of the black pants.

## 2024-10-20 NOTE — ED Triage Notes (Signed)
 Patient was brought in by EMS after reportedly calling 911 expressing suicidal ideation. Per EMS, the patient was initially distraught on scene but was eventually able to be calmed. EMS was unable to obtain vital signs due to the patient repeatedly removing the blood pressure cuff. Upon assessment in the ED, the patient was visibly tearful, stating that his girlfriend is the reason he came in and expressing concern that she is likely with another man. He denied suicidal or homicidal ideation at this time. Patient refused laboratory work, stating that he does not do needles. Willingly dressed out and questioned repeatedly if this was a mental health institution.

## 2024-10-20 NOTE — ED Provider Notes (Signed)
 [facility] Provider Note    None    (approximate)   History   No chief complaint on file.   HPI [name] [name] is a 63 y.o. male who presents by EMS for apparent suicidal ideation.  History is unclear but according to EMS he called 911 and said he was thinking about killing himself.  He was upset at the scene but then was calm down.  Apparently the patient was tearful and stating that he is upset because he thinks his girlfriend might be with another man.  He is currently refusing medical workup and stating that he had thoughts of harming himself but no longer.  He claims to not know why he is here.     Physical Exam   ED Triage Vitals  Encounter Vitals Group     BP 10/20/24 2313 (!) 145/95     Girls Systolic BP Percentile --      Girls Diastolic BP Percentile --      Boys Systolic BP Percentile --      Boys Diastolic BP Percentile --      Pulse Rate 10/20/24 2313 87     Resp 10/20/24 2313 18     Temp 10/20/24 2313 97.7 F (36.5 C)     Temp Source 10/20/24 2313 Oral     SpO2 10/20/24 2313 99 %     Weight --      Height --      Head Circumference --      Peak Flow --      Pain Score 10/20/24 2314 0     Pain Loc --      Pain Education --      Exclude from Growth Chart --       Most recent vital signs: There were no vitals filed for this visit.  General: Awake, no distress.  CV:  Good peripheral perfusion.   Resp:  Normal effort. Speaking easily and comfortably, no accessory muscle usage nor intercostal retractions.   Abd:  No distention.  Other:  States he wants to go home, admits he was having thoughts of suicide earlier.  Declines to answer when asked if he uses any substances.   ED Results / Procedures / Treatments   Labs (all labs ordered are listed, but only abnormal results are displayed) Labs Reviewed - No data to display    PROCEDURES:  Critical Care performed: No  Procedures    IMPRESSION / MDM / ASSESSMENT AND PLAN  / ED COURSE  I reviewed the triage vital signs and the nursing notes.                              Differential diagnosis includes, but is not limited to, adjustment disorder, mood disorder, depression, substance use.  Patient's presentation is most consistent with acute presentation with potential threat to life or bodily function.  Labs/studies ordered: ***  Interventions/Medications given:  Medications - No data to display  (Note:  hospital course my include additional interventions and/or labs/studies not listed above.)   Patient is adamantly declining lab draw and I do not think it is beneficial to fight him about it.  He has no medical complaints or concerns and has the capacity to make this decision.  I consider him medically cleared unless he gets any indication that that is not the case.  He may benefit from psychiatry consultation given a history of schizophrenia and  bipolar disorder as well as substance-induced mood disorder and the SI he was expressing earlier.  I have ordered a psych consult but the patient is voluntary and may leave if he chooses to do so.         FINAL CLINICAL IMPRESSION(S) / ED DIAGNOSES   Final diagnoses:  None     Rx / DC Orders   ED Discharge Orders     None        Note:  This document was prepared using Dragon voice recognition software and may include unintentional dictation errors.

## 2024-10-21 DIAGNOSIS — F4325 Adjustment disorder with mixed disturbance of emotions and conduct: Secondary | ICD-10-CM | POA: Diagnosis not present

## 2024-10-21 NOTE — ED Provider Notes (Signed)
.----------------------------------------- °  11:48 AM on 10/21/2024 -----------------------------------------  Blood pressure 125/88, pulse 82, temperature 97.9 F (36.6 C), temperature source Oral, resp. rate 18, SpO2 99%.  Patient was evaluated by psych, is sober, ANO x 4 for them.  Had drank some alcohol last night.  He denies any SI or HI, no hallucinations.  Collateral information was obtained from girlfriend who denies any concerns that he would harm himself.  He is cleared for psych for discharge.  Will discharge with strict return precautions.  Instructed to follow-up with primary care outpatient for reassessment this week.  Discharge.  Medications - No data to display   ED Discharge Orders     None      Final diagnoses:  Depression, unspecified depression type  Mood disorder      Waymond Lorelle Cummins, MD 10/21/24 1149

## 2024-10-21 NOTE — Consult Note (Signed)
 [facility] Psychiatric Consult Initial  Patient Name: .[name] [name]  MRN: [id number]  DOB: [birth year]  Consult Order details:  Orders (From admission, onward)     Start     Ordered   10/20/24 2321  CONSULT TO CALL ACT TEAM       Ordering Provider: Gordan Huxley, MD  Provider:  (Not yet assigned)  Question:  Reason for Consult?  Answer:  Psych consult   10/20/24 2321   10/20/24 2321  IP CONSULT TO PSYCHIATRY       Ordering Provider: Gordan Huxley, MD  Provider:  (Not yet assigned)  Question Answer Comment  Reason for consult: Other (see comments)   Comments: initially expressed SI to EMS, concerned about spouse cheating on him, now denying everything      10/20/24 2321             Mode of Visit: Tele-visit Virtual Statement:TELE PSYCHIATRY ATTESTATION & CONSENT As the provider for this telehealth consult, I attest that I verified the patient's identity using two separate identifiers, introduced myself to the patient, provided my credentials, disclosed my location, and performed this encounter via a HIPAA-compliant, real-time, face-to-face, two-way, interactive audio and video platform and with the full consent and agreement of the patient (or guardian as applicable.) Patient physical location: [facility]. Telehealth provider physical location: home office in state of [place] .   Video start time:   Video end time:      Psychiatry Consult Evaluation  Service Date: October 21, 2024 LOS:  LOS: 0 days  Chief Complaint SI  Primary Psychiatric Diagnoses  Adjustment disorder 2.  Substance induced mood (rule out) 3.  schizophrenia  Assessment  [name] [name] is a 63 y.o. male admitted: Presented to the EDfor 10/20/2024 10:29 PM for evaluation of suicidal ideation and possible homicidal intent following emotional distress related to relationship conflict. He carries the psychiatric diagnoses of alcohol use disorder (suspected), and rule out adjustment  disorder with mixed emotional disturbance, and has a past medical history of schizophrenia no prior documented suicide attempts and possession of a firearm at home.  His current presentation of active suicidal ideation (I need to find some fentanyl  so I can go to sleep and not wake up), possible homicidal intent toward his girlfriend and her new partner, emotional agitation, impaired judgment, alcohol odor on breath, refusal of medical labs, and limited impulse control is most consistent with acute psychiatric crisis with safety risk to self and others, likely worsened by intoxication. He meets criteria for inpatient psychiatric hospitalization based on persistent SI with access to lethal means (firearm at home), escalating anger when questioned about harming others, inability to verbalize safety, recent call to 911 stating suicidal intent, and intoxication impairing reliability of his statements. Current outpatient psychotropic medications include gabapentin  and he does not recall other medication, historically he has had a favorable response to psychiatric medications. He states he is compliant with medication daily prior to admission.  On initial examination, patient is tearful, emotionally distressed, irritable, evasive when asked regarding HI, continues to endorse suicidal thoughts, denies hallucinations, has alcohol odor suggestive of recent use despite denying substance intake, and is currently unable to guarantee safety; therefore requires reassessment when sobriety can be confirmed.  Diagnoses:  Active Hospital problems: Active Problems:   * No active hospital problems. *    Plan   ## Psychiatric Medication Recommendations:    ## Medical Decision Making Capacity: Not specifically addressed in this encounter   ## Disposition:-- reassessment  when sobriety can be confirmed  ## Behavioral / Environmental: - No specific recommendations at this time.     ## Safety and Observation Level:   - Based on my clinical evaluation, I estimate the patient to be at low risk of self harm in the current setting. - At this time, we recommend  routine. This decision is based on my review of the chart including patient's history and current presentation, interview of the patient, mental status examination, and consideration of suicide risk including evaluating suicidal ideation, plan, intent, suicidal or self-harm behaviors, risk factors, and protective factors. This judgment is based on our ability to directly address suicide risk, implement suicide prevention strategies, and develop a safety plan while the patient is in the clinical setting. Please contact our team if there is a concern that risk level has changed.  CSSR Risk Category:C-SSRS RISK CATEGORY: No Risk  Suicide Risk Assessment: Patient has following modifiable risk factors for suicide: access to guns and triggering events, which we are addressing by patient education. Patient has following non-modifiable or demographic risk factors for suicide: male gender Patient has the following protective factors against suicide: Supportive family  Thank you for this consult request. Recommendations have been communicated to the primary team.  We will reassess when sobriety confirmed at this time.   [name] Vipond, NP       History of Present Illness  Relevant Aspects of Hospital ED Course:  Admitted on 10/20/2024 for evaluation of SI.    Patient Report:  The patient is a 63 year old male brought to the ED by EMS after reportedly calling 911 expressing suicidal ideation. EMS reported that the patient was initially very distraught at the scene and had difficulty cooperating, repeatedly removing the blood pressure cuff. Upon arrival to the ED, the patient appeared tearful and visibly upset.  The patient states he came to the hospital because of issues with his girlfriend, explaining that he messed with a sorry woman that caused me to come  here. He reports significant emotional distress, stating, I feel like shit, thats why Im here. Yes, I feel like I still want to hurt myself. I need to find fentanyl  so I can go to sleep and not wake up. He denies past suicide attempts but currently endorses ongoing suicidal thoughts.  He reports that his girlfriend has been unfaithful, using his money while spending time with another man. He states, She better watch out; she is going to get hurt doing people like that. When asked directly whether he intends to harm his girlfriend or her new partner, he becomes angry and evasive, replying, I cant answer thatyou know, thats why you asked.  He reports living alone and confirms he has a firearm at home. He denies substance use tonight, though he has a strong alcohol odor, and his level of intoxication cannot be confirmed because he refused lab work. He denies hallucinations. He identifies his daughter as his primary support system.  Psych ROS:  Depression: yes Anxiety:  yes Mania (lifetime and current): no Psychosis: (lifetime and current): yes  Review of Systems  Constitutional: Negative.   HENT: Negative.    Eyes: Negative.   Respiratory: Negative.    Cardiovascular: Negative.   Gastrointestinal: Negative.   Genitourinary: Negative.   Musculoskeletal: Negative.   Skin: Negative.   Neurological: Negative.   Psychiatric/Behavioral:  Positive for depression and suicidal ideas.      Psychiatric and Social History  Psychiatric History:  Information collected from Patient  Prev  Dx/Sx: Schizophrenia Current Psych Provider: not reported Home Meds (current): gabapentin  Previous Med Trials: not reported Therapy: denies  Prior Psych Hospitalization: denies  Prior Self Harm: denies Prior Violence: denies  Family Psych History: not reported Family Hx suicide: not reported  Social History:  Developmental Hx: unknown Educational Hx: unknown Occupational Hx: unknown Legal Hx:  unknown Living Situation: alone Spiritual Hx: unknown Access to weapons/lethal means: yes   Substance History Alcohol: denies  Tobacco: smokes cigarettes  Illicit drugs: denies Prescription drug abuse: denies Rehab hx: denies  Exam Findings   Vital Signs:  Temp:  [97.7 F (36.5 C)] 97.7 F (36.5 C) (12/09 2313) Pulse Rate:  [87] 87 (12/09 2313) Resp:  [18] 18 (12/09 2313) BP: (145)/(95) 145/95 (12/09 2313) SpO2:  [99 %] 99 % (12/09 2313) Blood pressure (!) 145/95, pulse 87, temperature 97.7 F (36.5 C), temperature source Oral, resp. rate 18, SpO2 99%. There is no height or weight on file to calculate BMI.  Physical Exam HENT:     Head: Normocephalic.     Nose: Nose normal.     Mouth/Throat:     Pharynx: Oropharynx is clear.  Eyes:     Extraocular Movements: Extraocular movements intact.  Pulmonary:     Effort: Pulmonary effort is normal.  Musculoskeletal:        General: Normal range of motion.     Cervical back: Normal range of motion.  Skin:    General: Skin is dry.  Neurological:     Mental Status: He is alert.      Other History   These have been pulled in through the EMR, reviewed, and updated if appropriate.  Family History:  The patient's family history includes Heart attack (age of onset: 19) in his mother; Heart attack (age of onset: 64) in his father.  Medical History: Past Medical History:  Diagnosis Date   Accidental drug overdose    Arthritis of knee    Bipolar 1 disorder (HCC)    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HCC)    History of 2019 novel coronavirus disease (COVID-19) 07/02/2021   Homicidal ideation    Hypertension    Malingering    MDD (major depressive disorder)    Polysubstance abuse (HCC)    a.) THC, cocaine, heroin, ETOH, tobacco. b.) died for 3 minutes following use of cocaine that was laced with fentanyl    Schizoaffective disorder (HCC)    Schizophrenia (HCC)    Severe alcohol use disorder (HCC)     Stroke (HCC)    2019   Suicidal ideation 04/13/2015   a.) SI (+) plan to jump in front of a train    Surgical History: Past Surgical History:  Procedure Laterality Date   CHOLECYSTECTOMY N/A 12/14/2014   Procedure: LAPAROSCOPIC CHOLECYSTECTOMY WITH INTRAOPERATIVE CHOLANGIOGRAM;  Surgeon: Debby Shipper, MD;  Location: [facility] OR;  Service: General;  Laterality: N/A;   HERNIA REPAIR     KNEE CLOSED REDUCTION Right 07/10/2022   Procedure: CLOSED MANIPULATION KNEE;  Surgeon: Marchia Drivers, MD;  Location: [facility] ORS;  Service: Orthopedics;  Laterality: Right;   LEFT HEART CATH AND CORONARY ANGIOGRAPHY N/A 08/05/2018   Procedure: LEFT HEART CATH AND CORONARY ANGIOGRAPHY;  Surgeon: Dann Candyce RAMAN, MD;  Location: [facility] INVASIVE CV LAB;  Service: Cardiovascular;  Laterality: N/A;   TOTAL KNEE ARTHROPLASTY Right 02/06/2022   Procedure: TOTAL KNEE ARTHROPLASTY;  Surgeon: Marchia Drivers, MD;  Location: [facility] ORS;  Service: Orthopedics;  Laterality: Right;     Medications:  No current  facility-administered medications for this encounter.  Current Outpatient Medications:    albuterol  (VENTOLIN  HFA) 108 (90 Base) MCG/ACT inhaler, Inhale 2 puffs into the lungs every 6 (six) hours as needed for wheezing or shortness of breath., Disp: 8 g, Rfl: 5   amLODipine  (NORVASC ) 10 MG tablet, Take 10 mg by mouth daily., Disp: , Rfl:    atorvastatin  (LIPITOR) 20 MG tablet, Take 1 tablet (20 mg total) by mouth daily., Disp: 90 tablet, Rfl: 0   DIALYVITE VITAMIN D3 MAX 1.25 MG (50000 UT) TABS, Take 1 capsule by mouth once a week., Disp: , Rfl:    gabapentin  (NEURONTIN ) 600 MG tablet, Take 600 mg by mouth 3 (three) times daily., Disp: , Rfl:    hydrOXYzine  (VISTARIL ) 25 MG capsule, Take 1 capsule (25 mg total) by mouth every 8 (eight) hours as needed for itching (take 1 hour before bedtime)., Disp: 90 capsule, Rfl: 1   lisinopril  (ZESTRIL ) 40 MG tablet, Take 40 mg by mouth every morning., Disp: , Rfl:    Facility-Administered Medications Ordered in Other Encounters:    clindamycin  (CLEOCIN ) 600 mg in dextrose  5 % 50 mL IVPB, 600 mg, Intravenous, Once, Krasinski, Kevin, MD  Allergies: No Known Allergies  Patrina Andreas, NP

## 2024-10-21 NOTE — Consult Note (Signed)
 [facility] Consult Follow Up  Patient Name: .[name] [name]  MRN: [id number]  DOB: 07-31-1961  Consult Order details:  Orders (From admission, onward)     Start     Ordered   10/20/24 2321  CONSULT TO CALL ACT TEAM       Ordering Provider: Gordan Huxley, MD  Provider:  (Not yet assigned)  Question:  Reason for Consult?  Answer:  Psych consult   10/20/24 2321   10/20/24 2321  IP CONSULT TO PSYCHIATRY       Ordering Provider: Gordan Huxley, MD  Provider:  (Not yet assigned)  Question Answer Comment  Reason for consult: Other (see comments)   Comments: initially expressed SI to EMS, concerned about spouse cheating on him, now denying everything      10/20/24 2321             Mode of Visit: In person    Psychiatry Consult Evaluation  Service Date: October 21, 2024 LOS:  LOS: 0 days  Chief Complaint SI  Primary Psychiatric Diagnoses  Adjustment disorder 2.  Substance induced mood (rule out) 3.  schizophrenia  Assessment  [name] [name] is a 63 y.o. male admitted: Presented to the EDfor 10/20/2024 10:29 PM for evaluation of suicidal ideation and possible homicidal intent following emotional distress related to relationship conflict. He carries the psychiatric diagnoses of alcohol use disorder (suspected), and rule out adjustment disorder with mixed emotional disturbance, and has a past medical history of schizophrenia no prior documented suicide attempts and possession of a firearm at home.  His current presentation of active suicidal ideation (I need to find some fentanyl  so I can go to sleep and not wake up), possible homicidal intent toward his girlfriend and her new partner, emotional agitation, impaired judgment, alcohol odor on breath, refusal of medical labs, and limited impulse control is most consistent with acute psychiatric crisis with safety risk to self and others, likely worsened by intoxication. He meets criteria for inpatient psychiatric hospitalization  based on persistent SI with access to lethal means (firearm at home), escalating anger when questioned about harming others, inability to verbalize safety, recent call to 911 stating suicidal intent, and intoxication impairing reliability of his statements. Current outpatient psychotropic medications include gabapentin  and he does not recall other medication, historically he has had a favorable response to psychiatric medications. He states he is compliant with medication daily prior to admission.  On initial examination, patient is tearful, emotionally distressed, irritable, evasive when asked regarding HI, continues to endorse suicidal thoughts, denies hallucinations, has alcohol odor suggestive of recent use despite denying substance intake, and is currently unable to guarantee safety; therefore requires reassessment when sobriety can be confirmed.  10/21/2024: Reassessment   1. Presenting Concerns: Patient initially presented with verbal reports of suicidal ideation (SI) and homicidal ideation (HI) while under the influence of intoxicating substances. Patient did report to this provider, that he began drinking beer last night when he found out his girlfriend was on a date with another man.  2. Mental Status at Time of Initial Statements: At initial contact, patient appeared intoxicated with impaired judgment, reduced impulse control, and disorganized thought processes likely related to substance use.  3. Reassessment Post-Intoxication: Following clinical sobriety, the patient denied SI and HI and reported that previous statements were made while intoxicated. Today, patient denied any recollection of making these statements last night. Patient demonstrated improved orientation, coherence, and insight during reassessment. No current intent, plan, or preparatory behaviors identified.  4. Access  to Means: Patient initially stated he had access to a weapon. Collateral information was obtained from  girlfriend, Berle [phone] patient permission, who denied patients access to firearms and reported no weapons in the home. No corroborating evidence was found to suggest access to firearms or other lethal means. Today on assessment, patient denied any access to weapons.  5. Collateral Information: Janette contacted and reported no concerns regarding patient posing a danger to self or others.  Janette denied observed behavioral changes, threats, or access to weapons.  6. Protective Factors: Family support available (patient identified daughter), patient denies SI/HI once sober, improved judgment observed upon reassessment.  7. Risk Factors: Recent substance intoxication, prior verbalized SI/HI (under presumed alcohol intoxication).  8. Clinical Risk Determination: Based on current presentation, collateral input, resolution of intoxication-related symptoms, and absence of intent, plan, or access to means, patient is assessed to be at low acute risk for harm to self or others at this time.  9. Safety Plan / Recommendations: Patient advised to avoid substance use, utilize coping strategies, contact crisis resources if SI/HI returns, and follow up with outpatient mental health services. Family advised to monitor and support patient.  On current presentation there was no evidence of psychosis or mania and patient did not appear to be responding to internal stimuli. At this time, patient does not appear to be a risk to self or others.While future psychiatric events cannot be accurately predicted, the patient does not currently require acute inpatient psychiatric care and does not currently meet [place]  involuntary commitment criteria.   Diagnoses:  Active Hospital problems: Active Problems:   * No active hospital problems. *    Plan   ## Psychiatric Medication Recommendations:  None at this time  ## Medical Decision Making Capacity: Not specifically addressed in this  encounter   ## Disposition:-- Patient is psychiatrically cleared for discharge  ## Behavioral / Environmental: - No specific recommendations at this time.     ## Safety and Observation Level:  - Based on my clinical evaluation, I estimate the patient to be at low risk of self harm in the current setting. - At this time, we recommend  routine. This decision is based on my review of the chart including patient's history and current presentation, interview of the patient, mental status examination, and consideration of suicide risk including evaluating suicidal ideation, plan, intent, suicidal or self-harm behaviors, risk factors, and protective factors. This judgment is based on our ability to directly address suicide risk, implement suicide prevention strategies, and develop a safety plan while the patient is in the clinical setting. Please contact our team if there is a concern that risk level has changed.  CSSR Risk Category:C-SSRS RISK CATEGORY: No Risk  Suicide Risk Assessment: Patient has following modifiable risk factors for suicide: access to guns and triggering events, which we are addressing by confirming to access to weapons with both patient and significant other. Utilizing therapeutic communication to discuss distressing events Patient has following non-modifiable or demographic risk factors for suicide: male gender Patient has the following protective factors against suicide: Supportive family  Thank you for this consult request. Recommendations have been communicated to the primary team.  We will sign off at this time.   Zelda Sharps, NP        History of Present Illness  Relevant Aspects of Hospital ED Course:  Admitted on 10/20/2024 for evaluation of SI.    Patient Report:  The patient is a 63 year old male brought to the ED by  EMS after reportedly calling 911 expressing suicidal ideation. EMS reported that the patient was initially very distraught at the scene and had  difficulty cooperating, repeatedly removing the blood pressure cuff. Upon arrival to the ED, the patient appeared tearful and visibly upset.  The patient states he came to the hospital because of issues with his girlfriend, explaining that he messed with a sorry woman that caused me to come here. He reports significant emotional distress, stating, I feel like shit, thats why Im here. Yes, I feel like I still want to hurt myself. I need to find fentanyl  so I can go to sleep and not wake up. He denies past suicide attempts but currently endorses ongoing suicidal thoughts.  He reports that his girlfriend has been unfaithful, using his money while spending time with another man. He states, She better watch out; she is going to get hurt doing people like that. When asked directly whether he intends to harm his girlfriend or her new partner, he becomes angry and evasive, replying, I cant answer thatyou know, thats why you asked.  He reports living alone and confirms he has a firearm at home. He denies substance use tonight, though he has a strong alcohol odor, and his level of intoxication cannot be confirmed because he refused lab work. He denies hallucinations. He identifies his daughter as his primary support system.  10/21/2024: Reassessment   1. Presenting Concerns: Patient initially presented with verbal reports of suicidal ideation (SI) and homicidal ideation (HI) while under the influence of intoxicating substances. Patient did report to this provider, that he began drinking beer last night when he found out his girlfriend was on a date with another man.  2. Mental Status at Time of Initial Statements: At initial contact, patient appeared intoxicated with impaired judgment, reduced impulse control, and disorganized thought processes likely related to substance use.  3. Reassessment Post-Intoxication: Following clinical sobriety, the patient denied SI and HI and reported that previous  statements were made while intoxicated. Today, patient denied any recollection of making these statements last night. Patient demonstrated improved orientation, coherence, and insight during reassessment. No current intent, plan, or preparatory behaviors identified.  4. Access to Means: Patient initially stated he had access to a weapon. Collateral information was obtained from girlfriend, Berle [phone] patient permission, who denied patients access to firearms and reported no weapons in the home. No corroborating evidence was found to suggest access to firearms or other lethal means. Today on assessment, patient denied any access to weapons.  5. Collateral Information: Janette contacted and reported no concerns regarding patient posing a danger to self or others.  Janette denied observed behavioral changes, threats, or access to weapons.  6. Protective Factors: Family support available, patient denies SI/HI once sober, improved judgment upon reassessment.  7. Risk Factors: Recent substance intoxication, prior verbalized SI/HI (under presumed alcohol intoxication).  8. Clinical Risk Determination: Based on current presentation, collateral input, resolution of intoxication-related symptoms, and absence of intent, plan, or access to means, patient is assessed to be at low acute risk for harm to self or others at this time.  9. Safety Plan / Recommendations: Patient advised to avoid substance use, utilize coping strategies, contact crisis resources if SI/HI returns, and follow up with outpatient mental health services. Family advised to monitor and support patient.  On current presentation there was no evidence of psychosis or mania and patient did not appear to be responding to internal stimuli. Patient denied SI/HI/AH/VH.  At this time, patient  does not appear to be a risk to self or others.While future psychiatric events cannot be accurately predicted, the patient does not  currently require acute inpatient psychiatric care and does not currently meet [place]  involuntary commitment criteria.   Psych ROS:  Depression: Denied today Anxiety: Denied today Mania (lifetime and current): no Psychosis: (lifetime and current): yes- no current symptoms reported or observed at this time  Review of Systems  Respiratory:  Negative for shortness of breath.   Cardiovascular:  Negative for chest pain.  Neurological:  Negative for dizziness.  Psychiatric/Behavioral:  Positive for substance abuse. Negative for depression, hallucinations and suicidal ideas. The patient is not nervous/anxious.      Psychiatric and Social History  Psychiatric History: Obtained on initial interview Information collected from Patient  Prev Dx/Sx: Schizophrenia Current Psych Provider: not reported Home Meds (current): gabapentin  Previous Med Trials: not reported Therapy: denies  Prior Psych Hospitalization: Endorsed- reported for previous auditory hallucinations Prior Self Harm: denies Prior Violence: denies  Family Psych History: not reported Family Hx suicide: not reported  Social History:   Educational Hx: 11th grade Occupational Hx: Receives disability  Legal Hx: Patient reported upcoming court date on Friday regarding current probation  Living Situation: alone Access to weapons/lethal means: Denied- confirmed by significant other as well  Substance History Alcohol: endorsed Tobacco: smokes cigarettes daily Illicit drugs: denies Prescription drug abuse: denies Rehab hx: denies  Exam Findings   Vital Signs:  Temp:  [97.7 F (36.5 C)-97.9 F (36.6 C)] 97.9 F (36.6 C) (12/10 0742) Pulse Rate:  [82-87] 82 (12/10 0742) Resp:  [18] 18 (12/10 0742) BP: (125-145)/(88-95) 125/88 (12/10 0742) SpO2:  [99 %] 99 % (12/10 0742) Blood pressure 125/88, pulse 82, temperature 97.9 F (36.6 C), temperature source Oral, resp. rate 18, SpO2 99%. There is no height or weight on  file to calculate BMI.     Other History   These have been pulled in through the EMR, reviewed, and updated if appropriate.  Family History:  The patient's family history includes Heart attack (age of onset: 11) in his mother; Heart attack (age of onset: 42) in his father.  Medical History: Past Medical History:  Diagnosis Date   Accidental drug overdose    Arthritis of knee    Bipolar 1 disorder (HCC)    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HCC)    History of 2019 novel coronavirus disease (COVID-19) 07/02/2021   Homicidal ideation    Hypertension    Malingering    MDD (major depressive disorder)    Polysubstance abuse (HCC)    a.) THC, cocaine, heroin, ETOH, tobacco. b.) died for 3 minutes following use of cocaine that was laced with fentanyl    Schizoaffective disorder (HCC)    Schizophrenia (HCC)    Severe alcohol use disorder (HCC)    Stroke (HCC)    2019   Suicidal ideation 04/13/2015   a.) SI (+) plan to jump in front of a train    Surgical History: Past Surgical History:  Procedure Laterality Date   CHOLECYSTECTOMY N/A 12/14/2014   Procedure: LAPAROSCOPIC CHOLECYSTECTOMY WITH INTRAOPERATIVE CHOLANGIOGRAM;  Surgeon: Debby Shipper, MD;  Location: [facility] OR;  Service: General;  Laterality: N/A;   HERNIA REPAIR     KNEE CLOSED REDUCTION Right 07/10/2022   Procedure: CLOSED MANIPULATION KNEE;  Surgeon: Marchia Drivers, MD;  Location: [facility] ORS;  Service: Orthopedics;  Laterality: Right;   LEFT HEART CATH AND CORONARY ANGIOGRAPHY N/A 08/05/2018   Procedure: LEFT HEART CATH AND  CORONARY ANGIOGRAPHY;  Surgeon: Dann Candyce RAMAN, MD;  Location: [facility] INVASIVE CV LAB;  Service: Cardiovascular;  Laterality: N/A;   TOTAL KNEE ARTHROPLASTY Right 02/06/2022   Procedure: TOTAL KNEE ARTHROPLASTY;  Surgeon: Marchia Drivers, MD;  Location: [facility] ORS;  Service: Orthopedics;  Laterality: Right;     Medications:  No current facility-administered medications for  this encounter.  Current Outpatient Medications:    albuterol  (VENTOLIN  HFA) 108 (90 Base) MCG/ACT inhaler, Inhale 2 puffs into the lungs every 6 (six) hours as needed for wheezing or shortness of breath., Disp: 8 g, Rfl: 5   amLODipine  (NORVASC ) 10 MG tablet, Take 10 mg by mouth daily., Disp: , Rfl:    atorvastatin  (LIPITOR) 20 MG tablet, Take 1 tablet (20 mg total) by mouth daily., Disp: 90 tablet, Rfl: 0   DIALYVITE VITAMIN D3 MAX 1.25 MG (50000 UT) TABS, Take 1 capsule by mouth once a week., Disp: , Rfl:    gabapentin  (NEURONTIN ) 600 MG tablet, Take 600 mg by mouth 3 (three) times daily., Disp: , Rfl:    hydrOXYzine  (VISTARIL ) 25 MG capsule, Take 1 capsule (25 mg total) by mouth every 8 (eight) hours as needed for itching (take 1 hour before bedtime)., Disp: 90 capsule, Rfl: 1   lisinopril  (ZESTRIL ) 40 MG tablet, Take 40 mg by mouth every morning., Disp: , Rfl:   Facility-Administered Medications Ordered in Other Encounters:    clindamycin  (CLEOCIN ) 600 mg in dextrose  5 % 50 mL IVPB, 600 mg, Intravenous, Once, Krasinski, Kevin, MD  Allergies: No Known Allergies  Zelda Sharps, NP This note was created using Scientist, clinical (histocompatibility and immunogenetics). Please excuse any inadvertent transcription errors. Case was discussed with supervising physician Dr. Jadapalle who is agreeable with current plan.

## 2024-10-21 NOTE — ED Notes (Addendum)
 Pt discharged at this time. All belongings given to pt, discharged instructions reviewed. Pt denies any questions. RN attempted to help pt find a ride home. Pt walks abruptly out room and states I got to figure this out, I'm getting out of here. Pt attempts to try and help pt call friends and family but pt is uncooperative and continues to walk out to lobby at this time stating I'll figure it out, I am going home.

## 2024-10-21 NOTE — ED Notes (Signed)
VOL/Consult pending  

## 2024-10-21 NOTE — BH Assessment (Addendum)
 Comprehensive Clinical Assessment (CCA) Screening, Triage and Referral Note  10/21/2024 [name] [name] [id number] Recommendations for Services/Supports/Treatments: Consulted with NP Jon HERO., who recommended pt for continued observation and reassessment.   [name] [name] is a 63 year old, English speaking, black male. Pt presented to [facility] ED voluntarily. Per triage note: Patient was brought in by EMS after reportedly calling 911 expressing suicidal ideation. Per EMS, the patient was initially distraught on scene but was eventually able to be calmed. EMS was unable to obtain vital signs due to the patient repeatedly removing the blood pressure cuff. Upon assessment in the ED, the patient was visibly tearful, stating that his girlfriend is the reason he came in and expressing concern that she is likely with another man. He denied suicidal or homicidal ideation at this time. Patient refused laboratory work, stating that he does not do needles. Willingly dressed out and questioned repeatedly if this was a mental health institution.   The patient reported that he lives alone; states he's in the ED because of I messed with a sorry woman, she takes all my shit, and she's laying up with another man using my money that's why I'm here. The pt. made subtle threats, refusing to confirm or deny a plan to harm his girlfriend and her new person of intrigue. Pt initially explained that he didn't want to discuss reasons for coming to the ED, with encouragement from provider the pt became cooperative; however, pt. became remarkably angry when probed about having a plan to harm his girlfriend and her new partner. Pt was notably defensive when engaged about safety planning for the firearm he admitted to possessing. The pt denied using substances prior to arrival or generally. The pt. had dangerous insight and judgement as he admitted that he's tired of the world, continued to endorse SI, and articulated a plan to find  some Fentanyl  to go to sleep and not wake up. The pt. also reported that he has a daughter who is supportive. The pt also reports that he receives medication management. Pt was fixated on his grievances toward his girlfriend and the interview was terminated due to pt.'s increased agitation. Pt had clear and coherent speech and thoughts were linear. Pt was oriented x4. Pt presented with an angry mood; affect was congruent. Pt noted to be visibly anxious and restless. Pt's BAL and UDS are pending due to lab refusal. Pt denied current AV/H stating, I take medications for that..  Chief Complaint:  Chief Complaint  Patient presents with   Psychiatric Evaluation   Visit Diagnosis: Acute stress reaction with mixed disturbance of mood and conduct.  Patient Reported Information How did you hear about us ? No data recorded What Is the Reason for Your Visit/Call Today? No data recorded How [place] Has This Been Causing You Problems? No data recorded What Do You Feel Would Help You the Most Today? No data recorded  Have You Recently Had Any Thoughts About Hurting Yourself? No data recorded Are You Planning to Commit Suicide/Harm Yourself At This time? No data recorded  Have you Recently Had Thoughts About Hurting Someone [place]? No data recorded Are You Planning to Harm Someone at This Time? No data recorded Explanation: No data recorded  Have You Used Any Alcohol or Drugs in the Past 24 Hours? No data recorded How [place] Ago Did You Use Drugs or Alcohol? No data recorded What Did You Use and How Much? No data recorded  Do You Currently Have a Therapist/Psychiatrist? No data recorded Name of  Therapist/Psychiatrist: No data recorded  Have You Been Recently Discharged From Any Office Practice or Programs? No data recorded Explanation of Discharge From Practice/Program: No data recorded   CCA Screening Triage Referral Assessment Type of Contact: No data recorded Telemedicine Service Delivery:   Is this  Initial or Reassessment?   Date Telepsych consult ordered in [facility]:    Time Telepsych consult ordered in [facility]:    Location of Assessment: No data recorded Provider Location: No data recorded   Collateral Involvement: No data recorded  Does Patient Have a Court Appointed Legal Guardian? No data recorded Name and Contact of Legal Guardian: No data recorded If Minor and Not Living with Parent(s), Who has Custody? No data recorded Is CPS involved or ever been involved? No data recorded Is APS involved or ever been involved? No data recorded  Patient Determined To Be At Risk for Harm To Self or Others Based on Review of Patient Reported Information or Presenting Complaint? No data recorded Method: No data recorded Availability of Means: No data recorded Intent: No data recorded Notification Required: No data recorded Additional Information for Danger to Others Potential: No data recorded Additional Comments for Danger to Others Potential: No data recorded Are There Guns or Other Weapons in Your Home? No data recorded Types of Guns/Weapons: No data recorded Are These Weapons Safely Secured?                            No data recorded Who Could Verify You Are Able To Have These Secured: No data recorded Do You Have any Outstanding Charges, Pending Court Dates, Parole/Probation? No data recorded Contacted To Inform of Risk of Harm To Self or Others: No data recorded  Does Patient Present under Involuntary Commitment? No data recorded   [place] of Residence: No data recorded  Patient Currently Receiving the Following Services: No data recorded  Determination of Need: No data recorded  Options For Referral: No data recorded  Disposition Recommendation per psychiatric provider: Continued observation and reassessment.   [name] [name], LCAS
